# Patient Record
Sex: Female | Born: 1990
Health system: Southern US, Community
[De-identification: ages and names within clinical notes are randomized; demographics above are authoritative.]

## PROBLEM LIST (undated history)

## (undated) ENCOUNTER — Inpatient Hospital Stay (HOSPITAL_COMMUNITY): Payer: Self-pay

## (undated) DIAGNOSIS — A419 Sepsis, unspecified organism: Secondary | ICD-10-CM

## (undated) DIAGNOSIS — R571 Hypovolemic shock: Secondary | ICD-10-CM

## (undated) DIAGNOSIS — O1413 Severe pre-eclampsia, third trimester: Secondary | ICD-10-CM

## (undated) DIAGNOSIS — A6009 Herpesviral infection of other urogenital tract: Secondary | ICD-10-CM

## (undated) DIAGNOSIS — O139 Gestational [pregnancy-induced] hypertension without significant proteinuria, unspecified trimester: Secondary | ICD-10-CM

## (undated) DIAGNOSIS — F329 Major depressive disorder, single episode, unspecified: Secondary | ICD-10-CM

## (undated) DIAGNOSIS — K226 Gastro-esophageal laceration-hemorrhage syndrome: Secondary | ICD-10-CM

## (undated) DIAGNOSIS — F32A Depression, unspecified: Secondary | ICD-10-CM

## (undated) HISTORY — DX: Herpesviral infection of other urogenital tract: A60.09

## (undated) HISTORY — DX: Gestational (pregnancy-induced) hypertension without significant proteinuria, unspecified trimester: O13.9

## (undated) HISTORY — DX: Hypovolemic shock: R57.1

## (undated) HISTORY — PX: GASTRIC BYPASS: SHX52

## (undated) HISTORY — DX: Sepsis, unspecified organism: A41.9

## (undated) HISTORY — DX: Gastro-esophageal laceration-hemorrhage syndrome: K22.6

## (undated) HISTORY — DX: Severe pre-eclampsia, third trimester: O14.13

---
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
## 2011-03-22 LAB — HEPATIC FUNCTION PANEL
ALT: 32
AST: 25
Albumin: 3.2 — ABNORMAL LOW
Alkaline Phosphatase: 52
Total Protein: 6.2

## 2011-03-22 LAB — LIPID PANEL
Cholesterol: 91
LDL Cholesterol: 54

## 2011-03-22 LAB — DIFFERENTIAL
Eosinophils Absolute: 0
Lymphs Abs: 1.6
Monocytes Relative: 8
Neutro Abs: 2
Neutrophils Relative %: 50

## 2011-03-22 LAB — HEMOGLOBIN A1C
Hgb A1c MFr Bld: 5.3
Mean Plasma Glucose: 105

## 2011-03-22 LAB — DRUGS OF ABUSE SCREEN W/O ALC, ROUTINE URINE
Barbiturate Quant, Ur: NEGATIVE
Benzodiazepines.: NEGATIVE
Cocaine Metabolites: NEGATIVE
Methadone: NEGATIVE
Phencyclidine (PCP): NEGATIVE

## 2011-03-22 LAB — URINALYSIS, ROUTINE W REFLEX MICROSCOPIC
Glucose, UA: NEGATIVE
Hgb urine dipstick: NEGATIVE
pH: 6

## 2011-03-22 LAB — BASIC METABOLIC PANEL
BUN: 13
Chloride: 110
Potassium: 4.1

## 2011-03-22 LAB — CBC
HCT: 32.2 — ABNORMAL LOW
MCV: 84.3
Platelets: 223
WBC: 4 — ABNORMAL LOW

## 2011-03-22 LAB — GAMMA GT: GGT: 21

## 2013-06-20 HISTORY — PX: SLEEVE GASTROPLASTY: SHX1101

## 2015-06-21 NOTE — L&D Delivery Note (Signed)
Delivery Note At 9:08 PM a viable female was delivered via Vaginal, Spontaneous Delivery (Presentation: vertex;  OA).  APGAR: 7, 9; weight pending.   Placenta status: spontaneous, intact.  Cord: 3 vessel with the following complications: none.  Cord pH: not obtained  Anesthesia:  Epidural Episiotomy: None Lacerations: 1st degree Suture Repair: 3.0 vicryl Est. Blood Loss (mL):  600  Mom to postpartum.  Baby to Couplet care / Skin to Skin.  Jinny BlossomKaty D Mayo 04/01/2016, 9:44 PM  Patient is a G1 at 7071w5d who was admitted for IOL due to pre-e, significant hx of gastric restrictive surgery.  She progressed with augmentation via cytotec, foley, Pit and AROM.  I was gloved and present for delivery in its entirety.  Second stage of labor progressed to SVD.  Mild decels during second stage noted.  Complications: had boggy fundus approx 1hr after delivery requiring hemabate (had gotten cytotec @ del)  Lacerations: 1st degree  EBL: 600cc at del, then additional 200cc  Jermane Brayboy, CNM 2:02 AM  04/02/2016

## 2015-07-26 ENCOUNTER — Inpatient Hospital Stay (HOSPITAL_COMMUNITY)
Admission: AD | Admit: 2015-07-26 | Discharge: 2015-07-26 | Disposition: A | Discharge status: Home / Self Care | Payer: Self-pay | Source: Ambulatory Visit | Attending: Obstetrics & Gynecology | Admitting: Obstetrics & Gynecology

## 2015-07-26 ENCOUNTER — Encounter (HOSPITAL_COMMUNITY): Payer: Self-pay

## 2015-07-26 DIAGNOSIS — N760 Acute vaginitis: Secondary | ICD-10-CM | POA: Insufficient documentation

## 2015-07-26 DIAGNOSIS — A499 Bacterial infection, unspecified: Secondary | ICD-10-CM

## 2015-07-26 DIAGNOSIS — B9689 Other specified bacterial agents as the cause of diseases classified elsewhere: Secondary | ICD-10-CM

## 2015-07-26 DIAGNOSIS — Z3202 Encounter for pregnancy test, result negative: Secondary | ICD-10-CM | POA: Insufficient documentation

## 2015-07-26 HISTORY — DX: Depression, unspecified: F32.A

## 2015-07-26 HISTORY — DX: Major depressive disorder, single episode, unspecified: F32.9

## 2015-07-26 LAB — WET PREP, GENITAL
SPERM: NONE SEEN
TRICH WET PREP: NONE SEEN
Yeast Wet Prep HPF POC: NONE SEEN

## 2015-07-26 LAB — URINALYSIS, ROUTINE W REFLEX MICROSCOPIC
BILIRUBIN URINE: NEGATIVE
GLUCOSE, UA: NEGATIVE mg/dL
KETONES UR: NEGATIVE mg/dL
Nitrite: NEGATIVE
PROTEIN: NEGATIVE mg/dL
Specific Gravity, Urine: 1.025 (ref 1.005–1.030)
pH: 6 (ref 5.0–8.0)

## 2015-07-26 LAB — URINE MICROSCOPIC-ADD ON

## 2015-07-26 LAB — POCT PREGNANCY, URINE: PREG TEST UR: NEGATIVE

## 2015-07-26 MED ORDER — METRONIDAZOLE 500 MG PO TABS: 500.0000 mg | ORAL_TABLET | Freq: Two times a day (BID) | ORAL | Status: DC

## 2015-07-26 NOTE — Discharge Instructions (Signed)

## 2015-07-26 NOTE — MAU Provider Note (Signed)
History     CSN: [id number]  Arrival date and time: 07/26/15 1113   First Provider Initiated Contact with Patient 07/26/15 1144      Chief Complaint  Patient presents with  . Abdominal Pain  . Vaginal Bleeding   HPI  [name] at with complaint of abdominal pain and vaginal bleeding. States that she had one episode of unexpected bleeding w/ foreplay about 3 days ago, no heavy bleeding, only spotting at that time, bleeding is not ongoing. Also had one episode of sharp low abdominal pain, "like menstrual cramps" last night, no ongoing pain. Patient's last menstrual period was 07/07/2015 (exact date).    Past Medical History  Diagnosis Date  . Depression     pt hospitalized at 25 years old for major depression    Past Surgical History  Procedure Laterality Date  . Sleeve gastroplasty  2015    Family History  Problem Relation Age of Onset  . Cancer Maternal Grandmother     Social History  Substance Use Topics  . Smoking status: Never Smoker   . Smokeless tobacco: Never Used  . Alcohol Use: No    Allergies: No Known Allergies  No prescriptions prior to admission    Review of Systems  Constitutional: Negative.  Negative for fever and malaise/fatigue.  Respiratory: Negative.   Cardiovascular: Negative.   Gastrointestinal: Positive for abdominal pain. Negative for nausea, vomiting, diarrhea and constipation.  Genitourinary: Negative for dysuria, urgency, frequency, hematuria and flank pain.       Negative for vaginal discharge, dyspareunia, + spotting  Musculoskeletal: Negative.   Neurological: Negative.   Psychiatric/Behavioral: Negative.    Physical Exam   Blood pressure 115/90, pulse 93, temperature 98.8 F (37.1 C), temperature source Oral, resp. rate 18, height  (1.676 m), weight 240 lb (108.863 kg), last menstrual period 07/07/2015.  Physical Exam  Vitals reviewed. Constitutional: She is oriented to person, place, and time. She appears  well-developed. No distress.  Obese   HENT:  Head: Normocephalic and atraumatic.  Cardiovascular: Normal rate, regular rhythm and normal heart sounds.   Respiratory: Effort normal and breath sounds normal. No respiratory distress.  GI: Soft. Bowel sounds are normal. She exhibits no distension and no mass. There is no tenderness. There is no rebound and no guarding.  Genitourinary: There is no rash or lesion on the right labia. There is no rash or lesion on the left labia. Uterus is not deviated, not enlarged, not fixed and not tender. Cervix exhibits no motion tenderness, no discharge and no friability. Right adnexum displays no mass, no tenderness and no fullness. Left adnexum displays no mass, no tenderness and no fullness. No erythema, tenderness or bleeding in the vagina. Vaginal discharge (white) found.  Neurological: She is alert and oriented to person, place, and time.  Skin: Skin is warm and dry.  Psychiatric: She has a normal mood and affect.    MAU Course  Procedures  Results for orders placed or performed during the hospital encounter of 07/26/15 (from the past 24 hour(s))  Urinalysis, Routine w reflex microscopic (not at [facility])     Status: Abnormal   Collection Time: 07/26/15 11:40 AM  Result Value Ref Range   Color, Urine YELLOW YELLOW   APPearance CLEAR CLEAR   Specific Gravity, Urine 1.025 1.005 - 1.030   pH 6.0 5.0 - 8.0   Glucose, UA NEGATIVE NEGATIVE mg/dL   Hgb urine dipstick SMALL (A) NEGATIVE   Bilirubin Urine NEGATIVE NEGATIVE  Ketones, ur NEGATIVE NEGATIVE mg/dL   Protein, ur NEGATIVE NEGATIVE mg/dL   Nitrite NEGATIVE NEGATIVE   Leukocytes, UA MODERATE (A) NEGATIVE  Urine microscopic-add on     Status: Abnormal   Collection Time: 07/26/15 11:40 AM  Result Value Ref Range   Squamous Epithelial / LPF 0-5 (A) NONE SEEN   WBC, UA 6-30 0 - 5 WBC/hpf   RBC / HPF 0-5 0 - 5 RBC/hpf   Bacteria, UA RARE (A) NONE SEEN   Urine-Other MUCOUS PRESENT   Wet prep,  genital     Status: Abnormal   Collection Time: 07/26/15 12:05 PM  Result Value Ref Range   Yeast Wet Prep HPF POC NONE SEEN NONE SEEN   Trich, Wet Prep NONE SEEN NONE SEEN   Clue Cells Wet Prep HPF POC PRESENT (A) NONE SEEN   WBC, Wet Prep HPF POC FEW (A) NONE SEEN   Sperm NONE SEEN   Pregnancy, urine POC     Status: None   Collection Time: 07/26/15  1:19 PM  Result Value Ref Range   Preg Test, Ur NEGATIVE NEGATIVE     Assessment and Plan   1. BV (bacterial vaginosis)   Treat w/ Flagyl, follow up with worsening symptoms    Medication List    TAKE these medications        metroNIDAZOLE 500 MG tablet  Commonly known as:  FLAGYL  Take 1 tablet (500 mg total) by mouth 2 (two) times daily.            Follow-up Information    Follow up with [facility].   Why:  As needed   Contact information:   [address] [place] [address] [phone]        Carrie Cardenas 07/26/2015, 12:00 PM

## 2015-07-26 NOTE — MAU Note (Signed)
Pt c/o abdominal pain in lower middle abdomen and pelvic cramps starting last night. Pt also c/o vaginal bleeding since last week. Pt hasn't needed to wear a pad for the bleeding. Pt states her last started on 07/07/15. Pt is unsure if she is pregnant.

## 2015-07-27 LAB — GC/CHLAMYDIA PROBE AMP (~~LOC~~) NOT AT ARMC
CHLAMYDIA, DNA PROBE: POSITIVE — AB
NEISSERIA GONORRHEA: NEGATIVE

## 2015-07-27 LAB — RPR: RPR: NONREACTIVE

## 2015-07-27 LAB — HIV ANTIBODY (ROUTINE TESTING W REFLEX): HIV SCREEN 4TH GENERATION: NONREACTIVE

## 2015-08-03 ENCOUNTER — Emergency Department (HOSPITAL_COMMUNITY)
Admission: EM | Admit: 2015-08-03 | Discharge: 2015-08-03 | Disposition: A | Discharge status: Home / Self Care | Payer: Self-pay | Attending: Emergency Medicine | Admitting: Emergency Medicine

## 2015-08-03 ENCOUNTER — Encounter (HOSPITAL_COMMUNITY): Payer: Self-pay | Admitting: *Deleted

## 2015-08-03 DIAGNOSIS — Z8659 Personal history of other mental and behavioral disorders: Secondary | ICD-10-CM | POA: Insufficient documentation

## 2015-08-03 DIAGNOSIS — J039 Acute tonsillitis, unspecified: Secondary | ICD-10-CM | POA: Insufficient documentation

## 2015-08-03 DIAGNOSIS — H6122 Impacted cerumen, left ear: Secondary | ICD-10-CM | POA: Insufficient documentation

## 2015-08-03 LAB — RAPID STREP SCREEN (MED CTR MEBANE ONLY): Streptococcus, Group A Screen (Direct): NEGATIVE

## 2015-08-03 NOTE — Discharge Instructions (Signed)
Your strep test was negative. Her symptoms are likely due to a virus and should resolve on their own. You may continue using warm water salt rinses. You may also try using Cepacol that may help with your sore throat. Return to ED for any new or worsening symptoms as we discussed.  Tonsillitis Tonsillitis is an infection of the throat. This infection causes the tonsils to become red, tender, and puffy (swollen). Tonsils are groups of tissue at the back of your throat. If bacteria caused your infection, antibiotic medicine will be given to you. Sometimes symptoms of tonsillitis can be relieved with the use of steroid medicine. If your tonsillitis is severe and happens often, you may need to get your tonsils removed (tonsillectomy). HOME CARE   Rest and sleep often.  Drink enough fluids to keep your pee (urine) clear or pale yellow.  While your throat is sore, eat soft or liquid foods like:  Soup.  Ice cream.  Instant breakfast drinks.  Eat frozen ice pops.  Gargle with a warm or cold liquid to help soothe the throat. Gargle with a water and salt mix. Mix 1/4 teaspoon of salt and 1/4 teaspoon of baking soda in 1 cup of water.  Only take medicines as told by your doctor.  If you are given medicines (antibiotics), take them as told. Finish them even if you start to feel better. GET HELP IF:  You have large, tender lumps in your neck.  You have a rash.  You cough up green, yellow-brown, or bloody fluid.  You cannot swallow liquids or food for 24 hours.  You notice that only one of your tonsils is swollen. GET HELP RIGHT AWAY IF:   You throw up (vomit).  You have a very bad headache.  You have a stiff neck.  You have chest pain.  You have trouble breathing or swallowing.  You have bad throat pain, drooling, or your voice changes.  You have bad pain not helped by medicine.  You cannot fully open your mouth.  You have redness, puffiness, or bad pain in the neck.  You  have a fever. MAKE SURE YOU:   Understand these instructions.  Will watch your condition.  Will get help right away if you are not doing well or get worse.   This information is not intended to replace advice given to you by your health care provider. Make sure you discuss any questions you have with your health care provider.   Document Released: 11/23/2007 Document Revised: 06/11/2013 Document Reviewed: 11/23/2012 Elsevier Interactive Patient Education [employer]

## 2015-08-03 NOTE — ED Notes (Signed)
Pt reports a sore throat  1.5 days ago

## 2015-08-03 NOTE — ED Notes (Signed)
Declined W/C at D/C and was escorted to lobby by RN. 

## 2015-08-03 NOTE — ED Provider Notes (Signed)
CSN: [id number]     Arrival date & time 08/03/15  1237 History   By signing my name below, I, Iona Beard, attest that this documentation has been prepared under the direction and in the presence of Velna Hatchet, PA-C. Electronically Signed: Iona Beard, ED Scribe 08/03/2015 at 2:15 PM.   Chief Complaint  Patient presents with  . Sore Throat    The history is provided by the patient. No language interpreter was used.   HPI Comments: Carrie Cardenas is a 25 y.o. female who presents to the Emergency Department complaining of gradual onset, constant sore throat, onset about two days ago. Pt reports associated otalgia in her left ear. Also reports mild runny nose and nasal congestion. No worsening or alleviating factors noted. Son tried anything to improve symptoms. Pt denies cough, fever, SOB, or any other associated symptoms.    Past Medical History  Diagnosis Date  . Depression     pt hospitalized at 25 years old for major depression   Past Surgical History  Procedure Laterality Date  . Sleeve gastroplasty  2015  . Gastric bypass     Family History  Problem Relation Age of Onset  . Cancer Maternal Grandmother    Social History  Substance Use Topics  . Smoking status: Never Smoker   . Smokeless tobacco: Never Used  . Alcohol Use: No   OB History    Gravida Para Term Preterm AB TAB SAB Ectopic Multiple Living       Review of Systems  Constitutional: Negative for fever.  HENT: Positive for ear pain and sore throat.   Respiratory: Negative for cough and shortness of breath.       Allergies  Review of patient's allergies indicates no known allergies.  Home Medications   Prior to Admission medications   Medication Sig Start Date End Date Taking? Authorizing Provider  metroNIDAZOLE (FLAGYL) 500 MG tablet Take 1 tablet (500 mg total) by mouth 2 (two) times daily. 07/26/15   Archie Patten, CNM   BP 123/79 mmHg  Pulse 82  Temp(Src)  99 F (37.2 C) (Oral)  Resp 16  Ht  (1.676 m)  Wt 240 lb (108.863 kg)  BMI 38.76 kg/m2  SpO2 100%  LMP 07/07/2015 (Exact Date) Physical Exam  Constitutional: She appears well-developed and well-nourished. No distress.  HENT:  Head: Normocephalic and atraumatic.  Right Ear: Hearing, tympanic membrane and external ear normal.  Mouth/Throat: Oropharyngeal exudate and posterior oropharyngeal erythema present. No posterior oropharyngeal edema or tonsillar abscesses.  Mildl erythema to posterior oropharynx. Mild left tonsillar exudate.  No unilateral tonsillar  Swelling. No ceruminous.  No cervical lymphadenopathy.  Left ear cerumen impaction. Right ear TM normal.  Eyes: Conjunctivae and EOM are normal.  Neck: Neck supple. No tracheal deviation present.  Cardiovascular: Normal rate.   Pulmonary/Chest: Effort normal. No respiratory distress.  Musculoskeletal: Normal range of motion.  Neurological: She is alert.  Skin: Skin is warm and dry.  Psychiatric: She has a normal mood and affect. Her behavior is normal.    ED Course  Procedures (including critical care time) DIAGNOSTIC STUDIES: Oxygen Saturation is 100% on RA, normal by my interpretation.    COORDINATION OF CARE: 1:57 PM-Discussed treatment plan which includes rapid strep screen with pt at bedside and pt agreed to plan.   Labs Review Labs Reviewed  RAPID STREP SCREEN (NOT AT [facility])  CULTURE, GROUP A STREP [facility])  Imaging Review No results found. I have personally reviewed and evaluated these lab results as part of my medical decision-making.   EKG Interpretation None     Meds given in ED:  Medications - No data to display  Discharge Medication List as of 08/03/2015  3:12 PM     Filed Vitals:   08/03/15 1325 08/03/15 1528  BP: 123/79 124/77  Pulse: 82 78  Temp: 99 F (37.2 C)   TempSrc: Oral   Resp: 16 18  Height:  (1.676 m)   Weight: 108.863 kg   SpO2: 100% 99%    MDM  Patient  presents with sore throat, mild otalgia, nasal congestion,runny nose. Rapid strep is negative.  Physical exam is not concerning. Mild tonsillitis. Patients symptoms are consistent with URI, likely viral etiology. Discussed that antibiotics are not indicated for viral infections. Pt will be discharged with symptomatic treatment.  No evidence of retropharyngeal or peritonsillar abscess. Verbalizes understanding and is agreeable with plan. Pt is hemodynamically stable & in NAD prior to dc.  Final diagnoses:  Tonsillitis   I personally performed the services described in this documentation, which was scribed in my presence. The recorded information has been reviewed and is accurate.      Joycie Peek, PA-C 08/04/15 0901  Arby Barrette, MD 08/05/15 [phone]

## 2015-08-06 LAB — CULTURE, GROUP A STREP (THRC)

## 2015-08-24 ENCOUNTER — Inpatient Hospital Stay (HOSPITAL_COMMUNITY): Payer: Medicaid Other

## 2015-08-24 ENCOUNTER — Inpatient Hospital Stay (HOSPITAL_COMMUNITY)
Admission: AD | Admit: 2015-08-24 | Discharge: 2015-08-24 | Disposition: A | Discharge status: Home / Self Care | Payer: Medicaid Other | Source: Ambulatory Visit | Attending: Family Medicine | Admitting: Family Medicine

## 2015-08-24 ENCOUNTER — Encounter (HOSPITAL_COMMUNITY): Payer: Self-pay | Admitting: *Deleted

## 2015-08-24 DIAGNOSIS — O209 Hemorrhage in early pregnancy, unspecified: Secondary | ICD-10-CM | POA: Diagnosis not present

## 2015-08-24 DIAGNOSIS — O468X1 Other antepartum hemorrhage, first trimester: Secondary | ICD-10-CM

## 2015-08-24 DIAGNOSIS — R109 Unspecified abdominal pain: Secondary | ICD-10-CM | POA: Diagnosis not present

## 2015-08-24 DIAGNOSIS — Z3491 Encounter for supervision of normal pregnancy, unspecified, first trimester: Secondary | ICD-10-CM

## 2015-08-24 DIAGNOSIS — O26899 Other specified pregnancy related conditions, unspecified trimester: Secondary | ICD-10-CM

## 2015-08-24 DIAGNOSIS — O26891 Other specified pregnancy related conditions, first trimester: Secondary | ICD-10-CM | POA: Insufficient documentation

## 2015-08-24 DIAGNOSIS — O418X10 Other specified disorders of amniotic fluid and membranes, first trimester, not applicable or unspecified: Secondary | ICD-10-CM

## 2015-08-24 DIAGNOSIS — Z3A01 Less than 8 weeks gestation of pregnancy: Secondary | ICD-10-CM | POA: Diagnosis not present

## 2015-08-24 LAB — URINALYSIS, ROUTINE W REFLEX MICROSCOPIC
BILIRUBIN URINE: NEGATIVE
GLUCOSE, UA: NEGATIVE mg/dL
Hgb urine dipstick: NEGATIVE
KETONES UR: NEGATIVE mg/dL
Leukocytes, UA: NEGATIVE
NITRITE: NEGATIVE
PH: 6 (ref 5.0–8.0)
Protein, ur: NEGATIVE mg/dL

## 2015-08-24 LAB — CBC
HCT: 30.1 % — ABNORMAL LOW (ref 36.0–46.0)
HEMOGLOBIN: 9.9 g/dL — AB (ref 12.0–15.0)
MCH: 27.4 pg (ref 26.0–34.0)
MCHC: 32.9 g/dL (ref 30.0–36.0)
MCV: 83.4 fL (ref 78.0–100.0)
Platelets: 169 10*3/uL (ref 150–400)
RBC: 3.61 MIL/uL — AB (ref 3.87–5.11)
RDW: 13.7 % (ref 11.5–15.5)
WBC: 3.1 10*3/uL — AB (ref 4.0–10.5)

## 2015-08-24 LAB — WET PREP, GENITAL
Clue Cells Wet Prep HPF POC: NONE SEEN
Sperm: NONE SEEN
TRICH WET PREP: NONE SEEN
YEAST WET PREP: NONE SEEN

## 2015-08-24 LAB — ABO/RH: ABO/RH(D): A POS

## 2015-08-24 LAB — POCT PREGNANCY, URINE: PREG TEST UR: POSITIVE — AB

## 2015-08-24 LAB — HCG, QUANTITATIVE, PREGNANCY: HCG, BETA CHAIN, QUANT, S: 56031 m[IU]/mL — AB (ref <5)

## 2015-08-24 NOTE — MAU Note (Signed)
Pt presents to MAU with complaints of lower abdominal pain for a month. Denies any vaginal bleeding or abnormal discharge

## 2015-08-24 NOTE — Discharge Instructions (Signed)
First Trimester of Pregnancy °The first trimester of pregnancy is from week 1 until the end of week 12 (months 1 through 3). A week after a sperm fertilizes an egg, the egg will implant on the wall of the uterus. This embryo will begin to develop into a baby. Genes from you and your partner are forming the baby. The female genes determine whether the baby is a boy or a girl. At 6-8 weeks, the eyes and face are formed, and the heartbeat can be seen on ultrasound. At the end of 12 weeks, all the baby's organs are formed.  °Now that you are pregnant, you will want to do everything you can to have a healthy baby. Two of the most important things are to get good prenatal care and to follow your health care provider's instructions. Prenatal care is all the medical care you receive before the baby's birth. This care will help prevent, find, and treat any problems during the pregnancy and childbirth. °BODY CHANGES °Your body goes through many changes during pregnancy. The changes vary from woman to woman.  °· You may gain or lose a couple of pounds at first. °· You may feel sick to your stomach (nauseous) and throw up (vomit). If the vomiting is uncontrollable, call your health care provider. °· You may tire easily. °· You may develop headaches that can be relieved by medicines approved by your health care provider. °· You may urinate more often. Painful urination may mean you have a bladder infection. °· You may develop heartburn as a result of your pregnancy. °· You may develop constipation because certain hormones are causing the muscles that push waste through your intestines to slow down. °· You may develop hemorrhoids or swollen, bulging veins (varicose veins). °· Your breasts may begin to grow larger and become tender. Your nipples may stick out more, and the tissue that surrounds them (areola) may become darker. °· Your gums may bleed and may be sensitive to brushing and flossing. °· Dark spots or blotches (chloasma,  mask of pregnancy) may develop on your face. This will likely fade after the baby is born. °· Your menstrual periods will stop. °· You may have a loss of appetite. °· You may develop cravings for certain kinds of food. °· You may have changes in your emotions from day to day, such as being excited to be pregnant or being concerned that something may go wrong with the pregnancy and baby. °· You may have more vivid and strange dreams. °· You may have changes in your hair. These can include thickening of your hair, rapid growth, and changes in texture. Some women also have hair loss during or after pregnancy, or hair that feels dry or thin. Your hair will most likely return to normal after your baby is born. °WHAT TO EXPECT AT YOUR PRENATAL VISITS °During a routine prenatal visit: °· You will be weighed to make sure you and the baby are growing normally. °· Your blood pressure will be taken. °· Your abdomen will be measured to track your baby's growth. °· The fetal heartbeat will be listened to starting around week 10 or 12 of your pregnancy. °· Test results from any previous visits will be discussed. °Your health care provider may ask you: °· How you are feeling. °· If you are feeling the baby move. °· If you have had any abnormal symptoms, such as leaking fluid, bleeding, severe headaches, or abdominal cramping. °· If you are using any tobacco products,   including cigarettes, chewing tobacco, and electronic cigarettes. °· If you have any questions. °Other tests that may be performed during your first trimester include: °· Blood tests to find your blood type and to check for the presence of any previous infections. They will also be used to check for low iron levels (anemia) and Rh antibodies. Later in the pregnancy, blood tests for diabetes will be done along with other tests if problems develop. °· Urine tests to check for infections, diabetes, or protein in the urine. °· An ultrasound to confirm the proper growth  and development of the baby. °· An amniocentesis to check for possible genetic problems. °· Fetal screens for spina bifida and Down syndrome. °· You may need other tests to make sure you and the baby are doing well. °· HIV (human immunodeficiency virus) testing. Routine prenatal testing includes screening for HIV, unless you choose not to have this test. °HOME CARE INSTRUCTIONS  °Medicines °· Follow your health care provider's instructions regarding medicine use. Specific medicines may be either safe or unsafe to take during pregnancy. °· Take your prenatal vitamins as directed. °· If you develop constipation, try taking a stool softener if your health care provider approves. °Diet °· Eat regular, well-balanced meals. Choose a variety of foods, such as meat or vegetable-based protein, fish, milk and low-fat dairy products, vegetables, fruits, and whole grain breads and cereals. Your health care provider will help you determine the amount of weight gain that is right for you. °· Avoid raw meat and uncooked cheese. These carry germs that can cause birth defects in the baby. °· Eating four or five small meals rather than three large meals a day may help relieve nausea and vomiting. If you start to feel nauseous, eating a few soda crackers can be helpful. Drinking liquids between meals instead of during meals also seems to help nausea and vomiting. °· If you develop constipation, eat more high-fiber foods, such as fresh vegetables or fruit and whole grains. Drink enough fluids to keep your urine clear or pale yellow. °Activity and Exercise °· Exercise only as directed by your health care provider. Exercising will help you: °¨ Control your weight. °¨ Stay in shape. °¨ Be prepared for labor and delivery. °· Experiencing pain or cramping in the lower abdomen or low back is a good sign that you should stop exercising. Check with your health care provider before continuing normal exercises. °· Try to avoid standing for long  periods of time. Move your legs often if you must stand in one place for a long time. °· Avoid heavy lifting. °· Wear low-heeled shoes, and practice good posture. °· You may continue to have sex unless your health care provider directs you otherwise. °Relief of Pain or Discomfort °· Wear a good support bra for breast tenderness.   °· Take warm sitz baths to soothe any pain or discomfort caused by hemorrhoids. Use hemorrhoid cream if your health care provider approves.   °· Rest with your legs elevated if you have leg cramps or low back pain. °· If you develop varicose veins in your legs, wear support hose. Elevate your feet for 15 minutes, 3-4 times a day. Limit salt in your diet. °Prenatal Care °· Schedule your prenatal visits by the twelfth week of pregnancy. They are usually scheduled monthly at first, then more often in the last 2 months before delivery. °· Write down your questions. Take them to your prenatal visits. °· Keep all your prenatal visits as directed by your   health care provider. °Safety °· Wear your seat belt at all times when driving. °· Make a list of emergency phone numbers, including numbers for family, friends, the hospital, and police and fire departments. °General Tips °· Ask your health care provider for a referral to a local prenatal education class. Begin classes no later than at the beginning of month 6 of your pregnancy. °· Ask for help if you have counseling or nutritional needs during pregnancy. Your health care provider can offer advice or refer you to specialists for help with various needs. °· Do not use hot tubs, steam rooms, or saunas. °· Do not douche or use tampons or scented sanitary pads. °· Do not cross your legs for long periods of time. °· Avoid cat litter boxes and soil used by cats. These carry germs that can cause birth defects in the baby and possibly loss of the fetus by miscarriage or stillbirth. °· Avoid all smoking, herbs, alcohol, and medicines not prescribed by  your health care provider. Chemicals in these affect the formation and growth of the baby. °· Do not use any tobacco products, including cigarettes, chewing tobacco, and electronic cigarettes. If you need help quitting, ask your health care provider. You may receive counseling support and other resources to help you quit. °· Schedule a dentist appointment. At home, brush your teeth with a soft toothbrush and be gentle when you floss. °SEEK MEDICAL CARE IF:  °· You have dizziness. °· You have mild pelvic cramps, pelvic pressure, or nagging pain in the abdominal area. °· You have persistent nausea, vomiting, or diarrhea. °· You have a bad smelling vaginal discharge. °· You have pain with urination. °· You notice increased swelling in your face, hands, legs, or ankles. °SEEK IMMEDIATE MEDICAL CARE IF:  °· You have a fever. °· You are leaking fluid from your vagina. °· You have spotting or bleeding from your vagina. °· You have severe abdominal cramping or pain. °· You have rapid weight gain or loss. °· You vomit blood or material that looks like coffee grounds. °· You are exposed to German measles and have never had them. °· You are exposed to fifth disease or chickenpox. °· You develop a severe headache. °· You have shortness of breath. °· You have any kind of trauma, such as from a fall or a car accident. °  °This information is not intended to replace advice given to you by your health care provider. Make sure you discuss any questions you have with your health care provider. °  °Document Released: 05/31/2001 Document Revised: 06/27/2014 Document Reviewed: 04/16/2013 °Elsevier Interactive Patient Education ©2016 [employer] °Subchorionic Hematoma °A subchorionic hematoma is a gathering of blood between the outer wall of the placenta and the inner wall of the womb (uterus). The placenta is the organ that connects the fetus to the wall of the uterus. The placenta performs the feeding, breathing (oxygen to the  fetus), and waste removal (excretory work) of the fetus.  °Subchorionic hematoma is the most common abnormality found on a result from ultrasonography done during the first trimester or early second trimester of pregnancy. If there has been little or no vaginal bleeding, early small hematomas usually shrink on their own and do not affect your baby or pregnancy. The blood is gradually absorbed over 1-2 weeks. When bleeding starts later in pregnancy or the hematoma is larger or occurs in an older pregnant woman, the outcome may not be as good. Larger hematomas may get bigger, which   increases the chances for miscarriage. Subchorionic hematoma also increases the risk of premature detachment of the placenta from the uterus, preterm (premature) labor, and stillbirth. °HOME CARE INSTRUCTIONS °· Stay on bed rest if your health care provider recommends this. Although bed rest will not prevent more bleeding or prevent a miscarriage, your health care provider may recommend bed rest until you are advised otherwise. °· Avoid heavy lifting (more than 10 lb [4.5 kg]), exercise, sexual intercourse, or douching as directed by your health care provider. °· Keep track of the number of pads you use each day and how soaked (saturated) they are. Write down this information. °· Do not use tampons. °· Keep all follow-up appointments as directed by your health care provider. Your health care provider may ask you to have follow-up blood tests or ultrasound tests or both. °SEEK IMMEDIATE MEDICAL CARE IF: °· You have severe cramps in your stomach, back, abdomen, or pelvis. °· You have a fever. °· You pass large clots or tissue. Save any tissue for your health care provider to look at. °· Your bleeding increases or you become lightheaded, feel weak, or have fainting episodes. °  °This information is not intended to replace advice given to you by your health care provider. Make sure you discuss any questions you have with your health care  provider. °  °Document Released: 09/21/2006 Document Revised: 06/27/2014 Document Reviewed: 01/03/2013 °Elsevier Interactive Patient Education ©2016 [employer] ° °

## 2015-08-24 NOTE — [facility] Provider Note (Signed)
History     CSN: [id number]  Arrival date and time: 08/24/15 [id number]   First Provider Initiated Contact with Patient 08/24/15 1012         Chief Complaint  Patient presents with  . Abdominal Pain  . Possible Pregnancy   HPI  Carrie Cardenas is a 25 y.o. G1P0000 at 8534w6d who presents for abdominal pain & possible pregnancy. Reports period like cramps in lower abdomen x 2 weeks. Pain comes & goes, occurs several times per day. Rates pain 5/10. No treatment.  Denies vaginal bleeding, vaginal discharge, n/v/d, constipation, or dysuria.   OB History    Gravida Para Term Preterm AB TAB SAB Ectopic Multiple Living   1 0 0 0 0 0 0 0 0 0       Past Medical History  Diagnosis Date  . Depression     pt hospitalized at 25 years old for major depression    Past Surgical History  Procedure Laterality Date  . Sleeve gastroplasty  2015  . Gastric bypass      Family History  Problem Relation Age of Onset  . Cancer Maternal Grandmother     Social History  Substance Use Topics  . Smoking status: Never Smoker   . Smokeless tobacco: Never Used  . Alcohol Use: No    Allergies: No Known Allergies  Prescriptions prior to admission  Medication Sig Dispense Refill Last Dose  . metroNIDAZOLE (FLAGYL) 500 MG tablet Take 1 tablet (500 mg total) by mouth 2 (two) times daily. (Patient not taking: Reported on 08/24/2015) 14 tablet 0     Review of Systems  Constitutional: Negative.   Gastrointestinal: Positive for abdominal pain. Negative for nausea, vomiting, diarrhea and constipation.  Genitourinary: Negative.    Physical Exam   Blood pressure 111/61, pulse 75, temperature 98.4 F (36.9 C), temperature source Oral, resp. rate 18, last menstrual period 07/07/2015.  Physical Exam  Nursing note and vitals reviewed. Constitutional: She is oriented to person, place, and time. She appears well-developed and well-nourished. No distress.  HENT:  Head: Normocephalic and atraumatic.  Eyes:  Conjunctivae are normal. Right eye exhibits no discharge. Left eye exhibits no discharge. No scleral icterus.  Neck: Normal range of motion.  Cardiovascular: Normal rate, regular rhythm and normal heart sounds.   No murmur heard. Respiratory: Effort normal and breath sounds normal. No respiratory distress. She has no wheezes.  GI: Soft. Bowel sounds are normal. She exhibits no distension. There is no tenderness.  Genitourinary: No bleeding in the vagina. Vaginal discharge (small amount of thin white discharge) found.  Cervix closed  Neurological: She is alert and oriented to person, place, and time.  Skin: Skin is warm and dry. She is not diaphoretic.  Psychiatric: She has a normal mood and affect. Her behavior is normal. Judgment and thought content normal.    [facility] Course  Procedures Results for orders placed or performed during the hospital encounter of 08/24/15 (from the past 24 hour(s))  Urinalysis, Routine w reflex microscopic ([facility])     Status: Abnormal   Collection Time: 08/24/15  9:11 AM  Result Value Ref Range   Color, Urine YELLOW YELLOW   APPearance CLEAR CLEAR   Specific Gravity, Urine >1.030 (H) 1.005 - 1.030   pH 6.0 5.0 - 8.0   Glucose, UA NEGATIVE NEGATIVE mg/dL   Hgb urine dipstick NEGATIVE NEGATIVE   Bilirubin Urine NEGATIVE NEGATIVE   Ketones, ur NEGATIVE NEGATIVE mg/dL   Protein, ur NEGATIVE NEGATIVE mg/dL  Nitrite NEGATIVE NEGATIVE   Leukocytes, UA NEGATIVE NEGATIVE  Pregnancy, urine POC     Status: Abnormal   Collection Time: 08/24/15  9:13 AM  Result Value Ref Range   Preg Test, Ur POSITIVE (A) NEGATIVE  CBC     Status: Abnormal   Collection Time: 08/24/15  9:41 AM  Result Value Ref Range   WBC 3.1 (L) 4.0 - 10.5 K/uL   RBC 3.61 (L) 3.87 - 5.11 MIL/uL   Hemoglobin 9.9 (L) 12.0 - 15.0 g/dL   HCT 16.1 (L) 09.6 - 04.5 %   MCV 83.4 78.0 - 100.0 fL   MCH 27.4 26.0 - 34.0 pg   MCHC 32.9 30.0 - 36.0 g/dL   RDW 40.9 81.1 - 91.4 %   Platelets 169 150  - 400 K/uL  ABO/Rh     Status: None (Preliminary result)   Collection Time: 08/24/15  9:41 AM  Result Value Ref Range   ABO/RH(D) A POS   hCG, quantitative, pregnancy     Status: Abnormal   Collection Time: 08/24/15  9:41 AM  Result Value Ref Range   hCG, Beta Chain, Quant, S 78295 (H) <5 mIU/mL  Wet prep, genital     Status: Abnormal   Collection Time: 08/24/15 10:27 AM  Result Value Ref Range   Yeast Wet Prep HPF POC NONE SEEN NONE SEEN   Trich, Wet Prep NONE SEEN NONE SEEN   Clue Cells Wet Prep HPF POC NONE SEEN NONE SEEN   WBC, Wet Prep HPF POC MODERATE (A) NONE SEEN   Sperm NONE SEEN    US Ob Comp Less 14 Wks  08/24/2015  CLINICAL DATA:  Pregnant, abdominal pain EXAM: OBSTETRIC <14 WK [place] AND TRANSVAGINAL OB US TECHNIQUE: Both transabdominal and transvaginal ultrasound examinations were performed for complete evaluation of the gestation as well as the maternal uterus, adnexal regions, and pelvic cul-de-sac. Transvaginal technique was performed to assess early pregnancy. COMPARISON:  None. FINDINGS: Intrauterine gestational sac: Visualized/normal in shape. Yolk sac:  Present Embryo:  Present Cardiac Activity: Present Heart Rate: 135  bpm CRL:  10.0  mm   7 w   1 d                  [place] EDC: 04/10/2016 Subchorionic hemorrhage:  Small subchronic hemorrhage. Maternal uterus/adnexae: Bilateral ovaries are within normal limits, noting a 2.0 cm left corpus luteal cyst. Small volume pelvic and right adnexal fluid. IMPRESSION: Single live intrauterine gestation, with estimated gestational age [redacted] weeks 1 day by crown-rump length. Electronically Signed   By: Charline Bills M.D.   On: 08/24/2015 11:44   US Ob Transvaginal  08/24/2015  CLINICAL DATA:  Pregnant, abdominal pain EXAM: OBSTETRIC <14 WK [place] AND TRANSVAGINAL OB US TECHNIQUE: Both transabdominal and transvaginal ultrasound examinations were performed for complete evaluation of the gestation as well as the maternal uterus, adnexal regions, and  pelvic cul-de-sac. Transvaginal technique was performed to assess early pregnancy. COMPARISON:  None. FINDINGS: Intrauterine gestational sac: Visualized/normal in shape. Yolk sac:  Present Embryo:  Present Cardiac Activity: Present Heart Rate: 135  bpm CRL:  10.0  mm   7 w   1 d                  [place] EDC: 04/10/2016 Subchorionic hemorrhage:  Small subchronic hemorrhage. Maternal uterus/adnexae: Bilateral ovaries are within normal limits, noting a 2.0 cm left corpus luteal cyst. Small volume pelvic and right adnexal fluid. IMPRESSION: Single live intrauterine gestation, with  estimated gestational age [redacted] weeks 1 day by crown-rump length. Electronically Signed   By: Charline Bills M.D.   On: 08/24/2015 11:44     MDM A positive UPT positive Ultrasound shows SIUP with cardiac activity Assessment and Plan  A: 1. Normal IUP (intrauterine pregnancy) on prenatal ultrasound, first trimester   2. Abdominal pain in pregnancy   3. Subchorionic hematoma in first trimester     P; Discharge home Pregnancy verification letter & ob/gyn provider list given Discussed reasons to return to [facility] Start prenatal care Take prenatal vitamins  Judeth Horn 08/24/2015, 10:11 AM

## 2015-08-25 LAB — GC/CHLAMYDIA PROBE AMP (~~LOC~~) NOT AT ARMC
CHLAMYDIA, DNA PROBE: NEGATIVE
Neisseria Gonorrhea: NEGATIVE

## 2015-08-25 LAB — HIV ANTIBODY (ROUTINE TESTING W REFLEX): HIV SCREEN 4TH GENERATION: NONREACTIVE

## 2015-09-30 ENCOUNTER — Encounter (HOSPITAL_COMMUNITY): Payer: Self-pay | Admitting: Certified Nurse Midwife

## 2015-09-30 ENCOUNTER — Encounter: Payer: Self-pay | Admitting: Certified Nurse Midwife

## 2015-09-30 ENCOUNTER — Ambulatory Visit (INDEPENDENT_AMBULATORY_CARE_PROVIDER_SITE_OTHER): Payer: Medicaid Other | Admitting: Certified Nurse Midwife

## 2015-09-30 DIAGNOSIS — Z36 Encounter for antenatal screening of mother: Secondary | ICD-10-CM

## 2015-09-30 DIAGNOSIS — Z3493 Encounter for supervision of normal pregnancy, unspecified, third trimester: Secondary | ICD-10-CM | POA: Insufficient documentation

## 2015-09-30 DIAGNOSIS — O3680X0 Pregnancy with inconclusive fetal viability, not applicable or unspecified: Secondary | ICD-10-CM

## 2015-09-30 DIAGNOSIS — Z113 Encounter for screening for infections with a predominantly sexual mode of transmission: Secondary | ICD-10-CM

## 2015-09-30 DIAGNOSIS — O3680X1 Pregnancy with inconclusive fetal viability, fetus 1: Secondary | ICD-10-CM

## 2015-09-30 DIAGNOSIS — Z3401 Encounter for supervision of normal first pregnancy, first trimester: Secondary | ICD-10-CM

## 2015-09-30 DIAGNOSIS — Z124 Encounter for screening for malignant neoplasm of cervix: Secondary | ICD-10-CM

## 2015-09-30 NOTE — Patient Instructions (Signed)

## 2015-09-30 NOTE — Progress Notes (Signed)
   Subjective:    Carrie Cardenas is a G1P0000 6613w1d being seen today for her first obstetrical visit.  Her obstetrical history is significant for obesity.She had gastric sleeve surgery in 2015 and has lost 90 pounds Patient does intend to breast feed. Pregnancy history fully reviewed.  Patient reports nausea.  Filed Vitals:   09/30/15 0934  BP: 119/72  Pulse: 73  Weight: 230 lb 11.2 oz (104.645 kg)    HISTORY: OB History  Gravida Para Term Preterm AB SAB TAB Ectopic Multiple Living  1 0 0 0 0 0 0 0 0 0     # Outcome Date GA Lbr Len/2nd Weight Sex Delivery Anes PTL Lv  1 Current              Past Medical History  Diagnosis Date  . Depression     pt hospitalized at 25 years old for major depression   Past Surgical History  Procedure Laterality Date  . Sleeve gastroplasty  2015  . Gastric bypass     Family History  Problem Relation Age of Onset  . Cancer Maternal Grandmother   . Fibroids Mother   . Anemia Mother   . Pulmonary embolism Mother      Exam    Uterus:     Pelvic Exam:    Perineum: No Hemorrhoids   Vulva: normal   Vagina:  curdlike discharge   pH:    Cervix: no bleeding following Pap   Adnexa: not evaluated   Bony Pelvis: average  System: Breast:     Skin: normal coloration and turgor, no rashes    Neurologic: oriented, normal, normal mood   Extremities: normal strength, tone, and muscle mass   HEENT    Mouth/Teeth mucous membranes moist, pharynx normal without lesions   Neck supple and no masses   Cardiovascular: regular rate and rhythm   Respiratory:  appears well, vitals normal, no respiratory distress, acyanotic, normal RR, ear and throat exam is normal, neck free of mass or lymphadenopathy, chest clear, no wheezing, crepitations, rhonchi, normal symmetric air entry   Abdomen: soft, non-tender; bowel sounds normal; no masses,  no organomegaly   Urinary: urethral meatus normal      Assessment:    Pregnancy: G1P0000 Patient Active  Problem List   Diagnosis Date Noted  . Supervision of normal first pregnancy in first trimester 09/30/2015  . History of gastric restrictive surgery 09/30/2015        Plan:     Initial labs drawn. Prenatal vitamins. Problem list reviewed and updated. Genetic Screening discussed First Screen: ordered.  Ultrasound discussed; fetal survey: requested.  Follow up in 4 weeks. 50% of 30 min visit spent on counseling and coordination of care.    Takisha Pelle Grissett 09/30/2015

## 2015-09-30 NOTE — Progress Notes (Signed)
Bedside US for viability = Single IUP, FHR = 162 per PW doppler.  

## 2015-10-01 LAB — PRENATAL PROFILE (SOLSTAS)
Antibody Screen: NEGATIVE
Basophils Absolute: 0 cells/uL (ref 0–200)
Basophils Relative: 0 %
Eosinophils Absolute: 30 cells/uL (ref 15–500)
Eosinophils Relative: 1 %
HCT: 32.4 % — ABNORMAL LOW (ref 35.0–45.0)
HIV 1&2 Ab, 4th Generation: NONREACTIVE
Hemoglobin: 10.6 g/dL — ABNORMAL LOW (ref 11.7–15.5)
Hepatitis B Surface Ag: NEGATIVE
Lymphocytes Relative: 41 %
Lymphs Abs: 1230 cells/uL (ref 850–3900)
MCH: 27.2 pg (ref 27.0–33.0)
MCHC: 32.7 g/dL (ref 32.0–36.0)
MCV: 83.3 fL (ref 80.0–100.0)
MPV: 11.4 fL (ref 7.5–12.5)
Monocytes Absolute: 330 cells/uL (ref 200–950)
Monocytes Relative: 11 %
Neutro Abs: 1410 cells/uL — ABNORMAL LOW (ref 1500–7800)
Neutrophils Relative %: 47 %
Platelets: 192 10*3/uL (ref 140–400)
RBC: 3.89 MIL/uL (ref 3.80–5.10)
RDW: 14.8 % (ref 11.0–15.0)
Rh Type: POSITIVE
Rubella: 5.32 Index — ABNORMAL HIGH (ref <0.90)
WBC: 3 10*3/uL — ABNORMAL LOW (ref 3.8–10.8)

## 2015-10-01 LAB — CYTOLOGY - PAP

## 2015-10-02 LAB — HEMOGLOBINOPATHY EVALUATION
Hemoglobin Other: 0 %
Hgb A2 Quant: 3.3 % — ABNORMAL HIGH (ref 2.2–3.2)
Hgb A: 96.7 % — ABNORMAL LOW (ref 96.8–97.8)
Hgb F Quant: 0 % (ref 0.0–2.0)
Hgb S Quant: 0 %

## 2015-10-06 ENCOUNTER — Ambulatory Visit (HOSPITAL_COMMUNITY): Admission: RE | Admit: 2015-10-06 | Payer: Self-pay | Source: Ambulatory Visit

## 2015-10-06 ENCOUNTER — Ambulatory Visit (HOSPITAL_COMMUNITY): Payer: Self-pay | Attending: Certified Nurse Midwife

## 2015-10-07 ENCOUNTER — Encounter: Payer: Self-pay | Admitting: Certified Nurse Midwife

## 2015-10-16 ENCOUNTER — Telehealth: Payer: Self-pay | Admitting: *Deleted

## 2015-10-16 NOTE — Telephone Encounter (Signed)
Called pt to inform her of need for Colpo due to abnormal Pap (LSIL). There was no answer.  Appt will need to be scheduled once pt has been notified of results.

## 2015-10-19 NOTE — Telephone Encounter (Signed)
I attempted to call patient but there was no answer.

## 2015-10-22 NOTE — Telephone Encounter (Signed)
2nd attempt to reach patient with no answer or voicemail to leave a message

## 2015-10-28 ENCOUNTER — Ambulatory Visit (INDEPENDENT_AMBULATORY_CARE_PROVIDER_SITE_OTHER): Payer: Medicaid Other | Admitting: Certified Nurse Midwife

## 2015-10-28 ENCOUNTER — Encounter: Payer: Self-pay | Admitting: Certified Nurse Midwife

## 2015-10-28 VITALS — BP 108/46 | HR 73 | Wt 242.6 lb

## 2015-10-28 DIAGNOSIS — Z9884 Bariatric surgery status: Secondary | ICD-10-CM

## 2015-10-28 DIAGNOSIS — O99842 Bariatric surgery status complicating pregnancy, second trimester: Secondary | ICD-10-CM

## 2015-10-28 DIAGNOSIS — Z1389 Encounter for screening for other disorder: Secondary | ICD-10-CM

## 2015-10-28 DIAGNOSIS — Z3401 Encounter for supervision of normal first pregnancy, first trimester: Secondary | ICD-10-CM

## 2015-10-28 LAB — POCT URINALYSIS DIP (DEVICE)
Bilirubin Urine: NEGATIVE
Glucose, UA: NEGATIVE mg/dL
Hgb urine dipstick: NEGATIVE
Leukocytes, UA: NEGATIVE
Nitrite: NEGATIVE
Protein, ur: NEGATIVE mg/dL
Specific Gravity, Urine: 1.025 (ref 1.005–1.030)
Urobilinogen, UA: 0.2 mg/dL (ref 0.0–1.0)
pH: 6.5 (ref 5.0–8.0)

## 2015-10-28 NOTE — Patient Instructions (Signed)

## 2015-10-28 NOTE — Progress Notes (Signed)
Pt states she missed her early screening ultrasound. She reports [facility] the [facility] 1 week ago because she had a relative that was making her upset. She states she is fine now and does not have any feelings that she would harm herself or others. Subjective:  Carrie Cardenas is a 25 y.o. G1P0000 at 859w1d being seen today for ongoing prenatal care.  She is currently monitored for the following issues for this high-risk pregnancy and has Supervision of normal first pregnancy in first trimester and History of gastric restrictive surgery on her problem list.  Patient reports no complaints.  Contractions: Not present. Vag. Bleeding: None.  Movement: Present. Denies leaking of fluid.   The following portions of the patient's history were reviewed and updated as appropriate: allergies, current medications, past family history, past medical history, past social history, past surgical history and problem list. Problem list updated.  Objective:   Filed Vitals:   10/28/15 1132  BP: 108/46  Pulse: 73  Weight: 242 lb 9.6 oz (110.043 kg)    Fetal Status: Fetal Heart Rate (bpm): 145   Movement: Present     General:  Alert, oriented and cooperative. Patient is in no acute distress.  Skin: Skin is warm and dry. No rash noted.   Cardiovascular: Normal heart rate noted  Respiratory: Normal respiratory effort, no problems with respiration noted  Abdomen: Soft, gravid, appropriate for gestational age. Pain/Pressure: Absent     Pelvic: Vag. Bleeding: None     Cervical exam deferred        Extremities: Normal range of motion.  Edema: None  Mental Status: Normal mood and affect. Normal behavior. Normal judgment and thought content.   Urinalysis: Urine Protein: Negative Urine Glucose: Negative  Assessment and Plan:  Pregnancy: G1P0000 at 519w1d  1. Supervision of normal first pregnancy in first trimester  - US MFM OB COMP + 14 WK; Future  2. Encounter for routine screening for malformation  using ultrasonics  - US MFM OB COMP + 14 WK; Future  3. History of gastric restrictive surgery   Preterm labor symptoms and general obstetric precautions including but not limited to vaginal bleeding, contractions, leaking of fluid and fetal movement were reviewed in detail with the patient. Please refer to After Visit Summary for other counseling recommendations.  Return in about 4 weeks (around 11/25/2015).   Rhea PinkLori A Demarlo Riojas, CNM

## 2015-11-06 ENCOUNTER — Encounter: Payer: Self-pay | Admitting: *Deleted

## 2015-11-06 NOTE — Telephone Encounter (Signed)
Letter sent to pt stating that her recent Pap was abnormal and follow up is needed. Pt needs to call office to schedule appt and discuss results with a nurse.

## 2015-11-19 ENCOUNTER — Ambulatory Visit (HOSPITAL_COMMUNITY)
Admission: RE | Admit: 2015-11-19 | Discharge: 2015-11-19 | Disposition: A | Discharge status: Home / Self Care | Payer: Medicaid Other | Source: Ambulatory Visit | Attending: Certified Nurse Midwife | Admitting: Certified Nurse Midwife

## 2015-11-19 ENCOUNTER — Other Ambulatory Visit: Payer: Self-pay | Admitting: Certified Nurse Midwife

## 2015-11-19 DIAGNOSIS — O99842 Bariatric surgery status complicating pregnancy, second trimester: Secondary | ICD-10-CM

## 2015-11-19 DIAGNOSIS — Z3A19 19 weeks gestation of pregnancy: Secondary | ICD-10-CM

## 2015-11-19 DIAGNOSIS — Z3401 Encounter for supervision of normal first pregnancy, first trimester: Secondary | ICD-10-CM

## 2015-11-19 DIAGNOSIS — Z6839 Body mass index (BMI) 39.0-39.9, adult: Secondary | ICD-10-CM | POA: Diagnosis not present

## 2015-11-19 DIAGNOSIS — O99212 Obesity complicating pregnancy, second trimester: Secondary | ICD-10-CM

## 2015-11-19 DIAGNOSIS — Z1389 Encounter for screening for other disorder: Secondary | ICD-10-CM

## 2015-11-25 ENCOUNTER — Encounter: Payer: Self-pay | Admitting: Obstetrics and Gynecology

## 2015-11-25 ENCOUNTER — Ambulatory Visit (INDEPENDENT_AMBULATORY_CARE_PROVIDER_SITE_OTHER): Payer: Medicaid Other | Admitting: Obstetrics and Gynecology

## 2015-11-25 ENCOUNTER — Telehealth: Payer: Self-pay

## 2015-11-25 VITALS — BP 116/81 | HR 88 | Wt 236.2 lb

## 2015-11-25 DIAGNOSIS — N760 Acute vaginitis: Secondary | ICD-10-CM

## 2015-11-25 DIAGNOSIS — B3731 Acute candidiasis of vulva and vagina: Secondary | ICD-10-CM

## 2015-11-25 DIAGNOSIS — O26892 Other specified pregnancy related conditions, second trimester: Secondary | ICD-10-CM | POA: Diagnosis not present

## 2015-11-25 DIAGNOSIS — N898 Other specified noninflammatory disorders of vagina: Secondary | ICD-10-CM | POA: Diagnosis not present

## 2015-11-25 DIAGNOSIS — B373 Candidiasis of vulva and vagina: Secondary | ICD-10-CM

## 2015-11-25 DIAGNOSIS — A499 Bacterial infection, unspecified: Secondary | ICD-10-CM

## 2015-11-25 DIAGNOSIS — Z3402 Encounter for supervision of normal first pregnancy, second trimester: Secondary | ICD-10-CM | POA: Diagnosis present

## 2015-11-25 DIAGNOSIS — B9689 Other specified bacterial agents as the cause of diseases classified elsewhere: Secondary | ICD-10-CM

## 2015-11-25 LAB — POCT URINALYSIS DIP (DEVICE)
GLUCOSE, UA: NEGATIVE mg/dL
HGB URINE DIPSTICK: NEGATIVE
KETONES UR: 40 mg/dL — AB
Nitrite: NEGATIVE
Protein, ur: 30 mg/dL — AB
SPECIFIC GRAVITY, URINE: 1.025 (ref 1.005–1.030)
UROBILINOGEN UA: 1 mg/dL (ref 0.0–1.0)
pH: 6 (ref 5.0–8.0)

## 2015-11-25 LAB — GLUCOSE, CAPILLARY: Glucose-Capillary: 90 mg/dL (ref 65–99)

## 2015-11-25 MED ORDER — FLUCONAZOLE 150 MG PO TABS: ORAL_TABLET | ORAL | Status: DC

## 2015-11-25 NOTE — Telephone Encounter (Signed)
Pt was found in the restroom complaining of dizziness,fatigue and seating on the toilet with the assisted of my co-worker patient was taken to a room to be care. Pt blood pressure was 101/67  CBG 90. Pt is currently [redacted] weeks pregnant she stated she had not eaten since 6 am. Pt was given Prentis Langdon crackers and a coke to help with her feeling shaky. I waited about 20 minutes and patient stated she felt better. I advised patient if she should experience any symptoms like this she should call us back or go to the MAU to be check out.

## 2015-11-25 NOTE — Addendum Note (Signed)
Addended by: Jill SideAY, Phares Zaccone L on: 11/25/2015 11:22 AM   Modules accepted: Orders

## 2015-11-25 NOTE — Progress Notes (Signed)
Pt states she had a lump outside of vagina 2 weeks ago which was not painful and has now gotten smaller. She is also having vaginal dischsrge w/itching.

## 2015-11-25 NOTE — Progress Notes (Signed)
Subjective:  Carrie Cardenas is a 25 y.o. G1P0000 at 3478w1d being seen today for ongoing prenatal care.  She is currently monitored for the following issues for this low-risk pregnancy and has Supervision of normal first pregnancy in first trimester and History of gastric restrictive surgery on her problem list.  Patient reports pruritic vaginal discharge. lump on labia x 1 month getting smaller, not sure if drained, nontender.   Contractions: Not present. Vag. Bleeding: None.  Movement: Present. Denies leaking of fluid.   The following portions of the patient's history were reviewed and updated as appropriate: allergies, current medications, past family history, past medical history, past social history, past surgical history and problem list. Problem list updated.  Objective:   Filed Vitals:   11/25/15 1041  BP: 116/81  Pulse: 88  Weight: 236 lb 3.2 oz (107.14 kg)    Fetal Status: Fetal Heart Rate (bpm): 156   Movement: Present     General:  Alert, oriented and cooperative. Patient is in no acute distress.  Skin: Skin is warm and dry. No rash noted.   Cardiovascular: Normal heart rate noted  Respiratory: Normal respiratory effort, no problems with respiration noted  Abdomen: Soft, gravid, appropriate for gestational age. Pain/Pressure: Absent     Pelvic: Vag. Bleeding: None Vag D/C Character: White   Cervical exam performed        Extremities: Normal range of motion.  Edema: None  Mental Status: Normal mood and affect. Normal behavior. Normal judgment and thought content.  Spec: thick curdy discharge and slightly red vaginal mucosa Lower left labium majorum 1 cm nontender circumscribed mass, not draining, not eroded Urinalysis: Urine Protein: 1+ Urine Glucose: Negative  Assessment and Plan:  Pregnancy: G1P0000 at 7778w1d  1. Encounter for supervision of normal first pregnancy in second trimester Had fetal arrythmia on routine US with MFM POC for DT FHR q week until F/U scan  12/31/15  2. Vaginal discharge during pregnancy in second trimester Apparent yeast> presumptive tx with Diflucan Apparent healing sebaceous cyst left labium  - Wet prep, genital  Preterm labor symptoms and general obstetric precautions including but not limited to vaginal bleeding, contractions, leaking of fluid and fetal movement were reviewed in detail with the patient. Please refer to After Visit Summary for other counseling recommendations.  Return in about 4 weeks (around 12/23/2015).   Danae Orleanseirdre C Meria Crilly, CNM

## 2015-11-25 NOTE — Patient Instructions (Signed)

## 2015-11-26 LAB — WET PREP, GENITAL: TRICH WET PREP: NONE SEEN

## 2015-11-30 ENCOUNTER — Ambulatory Visit (INDEPENDENT_AMBULATORY_CARE_PROVIDER_SITE_OTHER): Payer: Medicaid Other | Admitting: Obstetrics & Gynecology

## 2015-11-30 VITALS — BP 111/70 | HR 81 | Wt 239.2 lb

## 2015-11-30 DIAGNOSIS — Z9884 Bariatric surgery status: Secondary | ICD-10-CM

## 2015-11-30 DIAGNOSIS — O99842 Bariatric surgery status complicating pregnancy, second trimester: Secondary | ICD-10-CM | POA: Diagnosis present

## 2015-11-30 DIAGNOSIS — B373 Candidiasis of vulva and vagina: Secondary | ICD-10-CM | POA: Insufficient documentation

## 2015-11-30 DIAGNOSIS — R55 Syncope and collapse: Secondary | ICD-10-CM | POA: Diagnosis not present

## 2015-11-30 DIAGNOSIS — B3731 Acute candidiasis of vulva and vagina: Secondary | ICD-10-CM | POA: Insufficient documentation

## 2015-11-30 DIAGNOSIS — B9689 Other specified bacterial agents as the cause of diseases classified elsewhere: Secondary | ICD-10-CM | POA: Insufficient documentation

## 2015-11-30 DIAGNOSIS — N760 Acute vaginitis: Secondary | ICD-10-CM

## 2015-11-30 NOTE — Progress Notes (Signed)
Subjective:  Carrie Cardenas is a 25 y.o. G1P0000 at 2322w6d being seen today for ongoing prenatal care.  She is currently monitored for the following issues for this high-risk pregnancy and has Supervision of normal first pregnancy in first trimester and History of gastric restrictive surgery on her problem list.  Patient reports syncope and near syncope on three occations in last 6 days, responded to rest and  a snack.  Contractions: Not present. Vag. Bleeding: None.  Movement: Present. Denies leaking of fluid.   The following portions of the patient's history were reviewed and updated as appropriate: allergies, current medications, past family history, past medical history, past social history, past surgical history and problem list. Problem list updated.  Objective:   Filed Vitals:   11/30/15 1502  BP: 111/70  Pulse: 81  Weight: 239 lb 3.2 oz (108.5 kg)    Fetal Status: Fetal Heart Rate (bpm): 143   Movement: Present     General:  Alert, oriented and cooperative. Patient is in no acute distress.  Skin: Skin is warm and dry. No rash noted.   Cardiovascular: Normal heart rate noted  Respiratory: Normal respiratory effort, no problems with respiration noted  Abdomen: Soft, gravid, appropriate for gestational age. Pain/Pressure: Absent     Pelvic: Cervical exam deferred        Extremities: Normal range of motion.  Edema: None  Mental Status: Normal mood and affect. Normal behavior. Normal judgment and thought content.   Urinalysis:      Assessment and Plan:  Pregnancy: G1P0000 at 3522w6d  1. History of gastric restrictive surgery Suspect nutritional insufficiency, needs assessment  2. Syncope, unspecified syncope type Se above  Preterm labor symptoms and general obstetric precautions including but not limited to vaginal bleeding, contractions, leaking of fluid and fetal movement were reviewed in detail with the patient. Please refer to After Visit Summary for other counseling  recommendations.  3 day to have labs and nutrition consult CBC and B12 level  Adam PhenixJames G Shamonique Battiste, MD

## 2015-11-30 NOTE — Progress Notes (Signed)
Pt reports fainting x 3 last week one of the times was at the Clinics

## 2015-11-30 NOTE — Patient Instructions (Signed)
Diet Following Bariatric Surgery The bariatric diet is designed to provide fluids and nourishment while promoting weight loss and healing after bariatric surgery. The diet is divided into 3 stages. Progress to each stage of the diet with your health care provider's approval.  WHAT DO I NEED TO KNOW ABOUT MY DIET FOLLOWING BARIATRIC SURGERY? Your surgeon may have individual guidelines for you about specific foods or the progression of your diet. Follow your surgeon's guidelines. You will follow these general guidelines during all stages of your diet:  Eat at set times.  Allow 30-45 minutes for each meal.  Take small bites. Chew your food until it is almost a liquid before swallowing it. Try setting down your utensils between bites to help yourself eat slower or make an "eat slowly" reminder sign.  Do not drink liquids for 30 minutes before meals and for 30 minutes after meals.  Drink between meals.  Stop eating when you are full. If you feel tightness or pressure in your chest, that means you are full. Wait 30 minutes before you try to eat again.  Take a chewable multivitamin daily in addition to other supplements as directed by your health care provider.  Sip at least 48-64 oz of liquid, preferably water, each day.  Stay away from concentrated sweets with more than 10 g of sugar per serving.  Protein is a very important part of the diet. Have protein with every meal when possible. Try eating your protein food first. STAGE 1 BARIATRIC DIET Stage 1 will begin after surgery and last until about 2 weeks after surgery or as directed by your health care provider. You will be on clear liquids immediately after surgery. After your health care provider approves, you will move to full liquids. You will eat at scheduled times during the day (for example at 8 AM, 12 noon, or 5 PM). You will also take a liquid protein supplement as recommended by your dietitian. Your dietitian will let you know how much  and how often you can eat.  Diet Guidelines  Limit intake to  cup of solid foods and  cup of beverages per meal.  You will need at least 60-80 g of protein daily or as determined by your dietitian. Guidelines for choosing a liquid protein supplement include:  At least 15 g of protein per 8 oz serving.  Less than 20 g total carbohydrate per 8 oz serving.  Less than 5 g fat per 8 oz serving.  Drink at least 48 oz (1440 mL) of fluid daily, which includes your protein supplement.  To get more protein, you can add 1 Tbsp non-fat dry milk powder to each  cup skim milk.  Avoid carbonated beverages, caffeine, alcohol, and concentrated sweets such as sugar, cakes, and cookies.  Take a chewable multivitamin complete with iron. Discuss additional supplements with your health care provider or dietitian. Beverages ( cup total per meal)  Decaffeinated coffee or tea.  Drinks with less than 25 g of sugar per serving.  Diet or sugar-free drinks.  Powdered drinks.  Sugar-free iced tea.  Broth.  Skim milk or lactose-free milk.  Unsweetened, plain soy milk.  Sugar-free gelatin dessert or frozen ice pops. Full Liquid Foods ( cup total per meal)  Protein-rich liquids (limit added protein powder to 25-30 g per serving).  Low-fat cream soup or soup that has been blended.  Artificially sweetened yogurt.  Sugar-free pudding.  Blended low-fat cottage cheese.  Plain yogurt or Greek yogurt (low-fat).  Unsweetened applesauce.    Hot wheat cereal, cream of rice, grits. STAGE 2 BARIATRIC DIET (SOFT OR PUREED DIET)  Stage 2 starts about 2 weeks after surgery and lasts until about 4 weeks after surgery. During this stage, you will eat soft, moist, ground, diced, or pureed foods in small meals, 3-6 times a day. Focus on protein-rich foods. You will also drink a liquid protein supplement between meals 2 times a day. After a week of soft protein foods, you can begin to add other soft foods in  addition to soft proteins. You should meet with your dietitian at this stage to begin preparation for Stage 3 of the bariatric diet.  Diet Guidelines  Meals should not exceed -1 cup total per meal.  You will need to blend solid foods to the consistency of applesauce.  Choose low-fat foods. Low-fat foods are foods with less than 5 g of fat per serving.  Include a protein with every meal and snack. Eat the protein food first. Try to eat 60-80 g of protein per day when possible.  Choose grains made from white or refined grain. Choices should have no more than 2 g of fiber per serving.  Continue to eat mindfully and slowly and always listen to your body.  Staying hydrated is very important during this stage. Full liquids from Stage 1 may be used for a meal or snack replacement.   Slowly add other soft foods to your diet. Examples of soft foods that can be added to your diet are listed in the following section. Soft Protein Foods  Well-cooked beans and lentils.  Eggs (scrambled, soft-boiled).  Tofu and other soft soy products (tempeh or bean veggie burgers).  Fish.  Lean poultry, well cooked and soft. Can also try baby food chicken or [place].  Ground meats.  Low-fat cottage cheese.  Hummus.  Fat-free or low-fat yogurt.  Gravy and light mayonnaise (to help moisten). Other Soft Foods  Soft fruit. This includes soft canned fruit in light syrup or natural juice, bananas, melons, peaches, pears, and strawberries.   Well-cooked vegetables.   Toast or crackers. Make sure these become soft by chewing them at least 20 times.  Hot wheat cereal.   Unflavored oatmeal.   Baby food or toddler fruits and vegetables.   Chicken or vegetable broth.   Blended fruit smoothies. STAGE 3 BARIATRIC DIET (REGULAR DIET) This stage starts about 6-8 weeks after surgery and will continue to promote weight loss. You will be allowed to eat foods of various textures. Ask your dietitian  to assist you in meal planning and additional behavioral strategies to make this final stage a long-term success. Diet Guidelines  Meals should not exceed -1 cup. As you heal and advance, you may be able to eat a little more with each meal. Always listen to your body.   Your diet should include foods from all the food groups.  Slowly add recommended foods to your diet. See the following section for a list of recommended foods.  Eat only at your chosen meal times.  When you feel full, stop eating.  Carbohydrates should be limited. Eat no more than 30 g per meal or 130 g per day. There are about 15-20 g of carbohydrates in 1 piece of bread or a medium piece of fruit.  Stay hydrated. Drink at least 48-64 oz of noncarbonated, zero-calorie fluid per day. Water is the best choice.  At first, avoid hard-to-digest foods like popcorn, nuts, celery, seeds, and the white parts of citrus fruits. With  time you may be able to eat these foods.  Take any vitamin supplements as directed by your health care provider.  Do not fast or skip meals. If you are having a hard time eating, talk to your health care provider or dietitian. WHAT FOODS CAN I EAT IN STAGE 3? Grains Choose whole grains when possible; aim to get half of your total grains as whole grains. These include whole wheat breads, crackers, and pastas. Cold or hot cereals with no added sugars. Rice (brown or white).  Vegetables Choose a variety of vegetables. All are allowed.  Fruits Choose a variety of fruits. All are allowed.  Meat and Other Protein Foods Choose lean sources of protein such as poultry, fish, and eggs. You may need to cook meats to tender at first. Smooth nut butters. Beans. Dairy Choose low-fat or nonfat dairy items (such as cheese, milk, and yogurt). Beverages Decaffeinated coffee. Caffeine-free tea. Sugar-free soft drinks without caffeine. Limit alcohol.  Condiments All are acceptable. Choose low-fat and  low-sodium when possible.  Sweets and Desserts Low-fat, low-sugar options. As part of a healthy diet, everyone should limit added sugars.  Fat and Oil Choose healthy fats such as olive oil, canola oil, and avocados.  The items listed above may not be a complete list of recommended foods or beverages. Contact your dietitian for more options.   This information is not intended to replace advice given to you by your health care provider. Make sure you discuss any questions you have with your health care provider.   Document Released: 12/11/2002 Document Revised: 06/11/2013 Document Reviewed: 04/10/2013 Elsevier Interactive Patient Education [employer]2016 Elsevier Inc.

## 2015-12-01 ENCOUNTER — Telehealth: Payer: Self-pay

## 2015-12-01 MED ORDER — METRONIDAZOLE 500 MG PO TABS: 500.0000 mg | ORAL_TABLET | Freq: Two times a day (BID) | ORAL | Status: DC

## 2015-12-01 MED ORDER — FLUCONAZOLE 150 MG PO TABS: 150.0000 mg | ORAL_TABLET | Freq: Once | ORAL | Status: DC

## 2015-12-01 NOTE — Telephone Encounter (Signed)
Per Leola Brazileidre Poe, CNM, pt was prescribed Flagyl 500 mg po bid x 7 days and Diflucan 150 mg x 1.  Attempted to contact pt x 2 unable to LM due to VM box not set up.  Note left in pt's appt for tomorrow to make sure she picks up Rx.

## 2015-12-02 ENCOUNTER — Ambulatory Visit: Payer: Medicaid Other

## 2015-12-03 ENCOUNTER — Ambulatory Visit: Payer: Medicaid Other

## 2015-12-09 ENCOUNTER — Ambulatory Visit: Payer: Medicaid Other | Admitting: *Deleted

## 2015-12-09 DIAGNOSIS — O368390 Maternal care for abnormalities of the fetal heart rate or rhythm, unspecified trimester, not applicable or unspecified: Secondary | ICD-10-CM

## 2015-12-09 NOTE — Progress Notes (Signed)
FHT by doppler 147, regular

## 2015-12-16 ENCOUNTER — Ambulatory Visit (INDEPENDENT_AMBULATORY_CARE_PROVIDER_SITE_OTHER): Payer: Medicaid Other | Admitting: Obstetrics & Gynecology

## 2015-12-16 ENCOUNTER — Ambulatory Visit (INDEPENDENT_AMBULATORY_CARE_PROVIDER_SITE_OTHER): Payer: Medicaid Other | Admitting: Clinical

## 2015-12-16 VITALS — BP 107/65 | HR 68 | Ht 66.0 in | Wt 254.0 lb

## 2015-12-16 DIAGNOSIS — F4329 Adjustment disorder with other symptoms: Secondary | ICD-10-CM

## 2015-12-16 DIAGNOSIS — O98512 Other viral diseases complicating pregnancy, second trimester: Secondary | ICD-10-CM

## 2015-12-16 DIAGNOSIS — O98312 Other infections with a predominantly sexual mode of transmission complicating pregnancy, second trimester: Principal | ICD-10-CM

## 2015-12-16 DIAGNOSIS — O98319 Other infections with a predominantly sexual mode of transmission complicating pregnancy, unspecified trimester: Secondary | ICD-10-CM

## 2015-12-16 DIAGNOSIS — A630 Anogenital (venereal) warts: Secondary | ICD-10-CM

## 2015-12-16 NOTE — Addendum Note (Signed)
Addended by: Cheree DittoGRAHAM, Tyee Vandevoorde A on: 12/16/2015 03:30 PM   Modules accepted: Orders

## 2015-12-16 NOTE — Progress Notes (Signed)
  ASSESSMENT: Pt currently experiencing Adjustment disorder with other symptoms. Pt needs to f/u with OB. Pt would benefit from psychoeducation and brief therapeutic interventions regarding coping with emotional adjustment to new health diagnosis. [facility]. Stage of Change: precontemplative  PLAN: 1. F/U with behavioral health clinician in as needed 2. Psychiatric Medications: none 3. Behavioral recommendations:   -Read educational material about HPV, as requested -Consider practicing daily relaxation breathing technique, as practiced in office visit today  SUBJECTIVE: Pt. referred by Dr. Macon LargeAnyanwu for emotional reaction to new health diagnosis Pt. reports the following symptoms/concerns: Pt states that she had somewhat prepared herself for the possibility that she may have HPV, but the reality feels stressful; she has many questions about HPV, and is concerned that others may think less of her as a result of this new diagnosis. She says she is not angry now, but wants to cope with future anger; goal is to stay as stress-free as possible during pregnancy. Duration of problem: Today Severity: mild   OBJECTIVE: Orientation & Cognition: Oriented x3. Thought processes normal and appropriate to situation. Mood: appropriate Affect: appropriate Appearance: appropriate Risk of harm to self or others: no known risk of harm to self or others Substance use: none Assessments administered: none today  Diagnosis: Adjustment disorder with other symptom CPT Code: F43.29  -------------------------------------------- Other(s) present in the room: none  Time spent with patient in exam room: 30 minutes 3:05 to 3:35pm

## 2015-12-16 NOTE — Progress Notes (Signed)
FHR 155bpm. Patient reports fetal movement

## 2015-12-16 NOTE — Progress Notes (Signed)
   CLINIC ENCOUNTER NOTE  History:  25 y.o. G1P0000  at 5110w1d here today for fetal monitoring but wanted to be evaluated for worsening vulvar bumps. No irritation from them. Worried about STI.  She reports increased non-pruritic vaginal discharge, but no bleeding,contractions, LOF. Good FM  Patient Active Problem List   Diagnosis Date Noted  . BV (bacterial vaginosis) 11/30/2015  . Yeast infection of the vagina 11/30/2015  . Supervision of normal first pregnancy in first trimester 09/30/2015  . History of gastric restrictive surgery 09/30/2015    The following portions of the patient's history were reviewed and updated as appropriate: allergies, current medications, past family history, past medical history, past social history, past surgical history and problem list.    Review of Systems:  Pertinent items noted in HPI and remainder of comprehensive ROS otherwise negative.  Objective:  Physical Exam BP 107/65 mmHg  Pulse 68  Ht 5\' 6"  (1.676 m)  Wt 254 lb (115.214 kg)  BMI 41.02 kg/m2  LMP 07/07/2015 (Exact Date) CONSTITUTIONAL: Well-developed, well-nourished female gravid, in no acute distress. ABDOMEN: Nontender, gravid, obese  PELVIC: Multiple condylomata noted, biggest lesions are between labia on both sides about 1 cm in size. No erythema. Normal appearing discharge.  Wet prep obtained. MUSCULOSKELETAL: Normal range of motion. No edema noted.  Reassuring FHR tracing.  Assessment & Plan:  Genital warts complicating pregnancy, second trimester Diagnosis discussed with patient; discussed HPV etiology and that is is a STI. No treatment needed at this point, will continue to monitor. If needed, may consider cryotherapy vs TCA vs excision or defer management to postpartum Given patient's reaction, appointment was made [facility].  Will follow up as previously scheduled.  Total face-to-face time with patient: 15 minutes. Over 50% of encounter was  spent on counseling and coordination of care.   Jaynie CollinsUGONNA  Taylan Marez, MD, FACOG Attending Obstetrician & Gynecologist, [facility]

## 2015-12-17 ENCOUNTER — Telehealth: Payer: Self-pay | Admitting: *Deleted

## 2015-12-17 LAB — WET PREP, GENITAL
Trich, Wet Prep: NONE SEEN
Yeast Wet Prep HPF POC: NONE SEEN

## 2015-12-17 NOTE — Telephone Encounter (Signed)
I called Carrie Cardenas and she states she can't talk right now- can I call back later. I informed we would try to call back tomorrow.

## 2015-12-17 NOTE — Telephone Encounter (Signed)
Carrie Cardenas left a message yesterday afternoon stating she had questions about the hpv diagnosis she was given today. Wants to know what options she has.

## 2015-12-23 NOTE — Telephone Encounter (Signed)
I called Carrie Cardenas and left a message I am returning your call, if you still need to speak with someone, please call our office  and leave a message.

## 2015-12-24 ENCOUNTER — Encounter: Payer: Medicaid Other | Admitting: Obstetrics and Gynecology

## 2015-12-25 ENCOUNTER — Encounter: Payer: Medicaid Other | Admitting: Certified Nurse Midwife

## 2015-12-31 ENCOUNTER — Other Ambulatory Visit (HOSPITAL_COMMUNITY): Payer: Self-pay | Admitting: Maternal and Fetal Medicine

## 2015-12-31 ENCOUNTER — Encounter (HOSPITAL_COMMUNITY): Payer: Self-pay

## 2015-12-31 ENCOUNTER — Ambulatory Visit (HOSPITAL_COMMUNITY)
Admission: RE | Admit: 2015-12-31 | Discharge: 2015-12-31 | Disposition: A | Discharge status: Home / Self Care | Payer: Medicaid Other | Source: Ambulatory Visit | Attending: Certified Nurse Midwife | Admitting: Certified Nurse Midwife

## 2015-12-31 VITALS — BP 115/60 | HR 85 | Wt 256.5 lb

## 2015-12-31 DIAGNOSIS — Z6841 Body Mass Index (BMI) 40.0 and over, adult: Secondary | ICD-10-CM | POA: Insufficient documentation

## 2015-12-31 DIAGNOSIS — Z3A25 25 weeks gestation of pregnancy: Secondary | ICD-10-CM | POA: Diagnosis not present

## 2015-12-31 DIAGNOSIS — O98319 Other infections with a predominantly sexual mode of transmission complicating pregnancy, unspecified trimester: Secondary | ICD-10-CM

## 2015-12-31 DIAGNOSIS — Z0489 Encounter for examination and observation for other specified reasons: Secondary | ICD-10-CM

## 2015-12-31 DIAGNOSIS — O409XX0 Polyhydramnios, unspecified trimester, not applicable or unspecified: Secondary | ICD-10-CM | POA: Diagnosis present

## 2015-12-31 DIAGNOSIS — IMO0002 Reserved for concepts with insufficient information to code with codable children: Secondary | ICD-10-CM

## 2015-12-31 DIAGNOSIS — E669 Obesity, unspecified: Secondary | ICD-10-CM | POA: Diagnosis not present

## 2015-12-31 DIAGNOSIS — Z36 Encounter for antenatal screening of mother: Secondary | ICD-10-CM | POA: Insufficient documentation

## 2015-12-31 DIAGNOSIS — Z3401 Encounter for supervision of normal first pregnancy, first trimester: Secondary | ICD-10-CM

## 2015-12-31 DIAGNOSIS — Z9884 Bariatric surgery status: Secondary | ICD-10-CM

## 2015-12-31 DIAGNOSIS — O99842 Bariatric surgery status complicating pregnancy, second trimester: Secondary | ICD-10-CM

## 2015-12-31 DIAGNOSIS — A630 Anogenital (venereal) warts: Secondary | ICD-10-CM

## 2015-12-31 DIAGNOSIS — O99212 Obesity complicating pregnancy, second trimester: Secondary | ICD-10-CM

## 2016-01-08 ENCOUNTER — Encounter: Payer: Self-pay | Admitting: Obstetrics & Gynecology

## 2016-01-08 ENCOUNTER — Encounter: Payer: Self-pay | Admitting: Advanced Practice Midwife

## 2016-01-11 ENCOUNTER — Encounter: Payer: Self-pay | Admitting: Family Medicine

## 2016-01-11 ENCOUNTER — Telehealth: Payer: Self-pay | Admitting: Family Medicine

## 2016-01-11 NOTE — Telephone Encounter (Signed)
Patient has missed several OB appointments. I have tried to call, but voice mail is full. Will send certified letter.

## 2016-01-12 ENCOUNTER — Encounter (HOSPITAL_COMMUNITY): Payer: Self-pay | Admitting: Anesthesiology

## 2016-01-12 ENCOUNTER — Encounter (HOSPITAL_COMMUNITY): Payer: Self-pay | Admitting: *Deleted

## 2016-01-12 ENCOUNTER — Telehealth: Payer: Self-pay | Admitting: Obstetrics and Gynecology

## 2016-01-12 ENCOUNTER — Telehealth: Payer: Self-pay | Admitting: Advanced Practice Midwife

## 2016-01-12 ENCOUNTER — Inpatient Hospital Stay (HOSPITAL_COMMUNITY)
Admission: AD | Admit: 2016-01-12 | Discharge: 2016-01-12 | Disposition: A | Discharge status: Home / Self Care | Payer: Medicaid Other | Source: Ambulatory Visit | Attending: Family Medicine | Admitting: Family Medicine

## 2016-01-12 ENCOUNTER — Ambulatory Visit (HOSPITAL_COMMUNITY)
Admission: RE | Admit: 2016-01-12 | Discharge: 2016-01-12 | Disposition: A | Discharge status: Home / Self Care | Payer: Medicaid Other | Source: Ambulatory Visit | Attending: Obstetrics and Gynecology | Admitting: Obstetrics and Gynecology

## 2016-01-12 ENCOUNTER — Encounter (INDEPENDENT_AMBULATORY_CARE_PROVIDER_SITE_OTHER): Payer: Self-pay

## 2016-01-12 DIAGNOSIS — Z3A27 27 weeks gestation of pregnancy: Secondary | ICD-10-CM | POA: Insufficient documentation

## 2016-01-12 DIAGNOSIS — O26892 Other specified pregnancy related conditions, second trimester: Secondary | ICD-10-CM | POA: Diagnosis not present

## 2016-01-12 DIAGNOSIS — R600 Localized edema: Secondary | ICD-10-CM | POA: Insufficient documentation

## 2016-01-12 DIAGNOSIS — Z9884 Bariatric surgery status: Secondary | ICD-10-CM | POA: Diagnosis not present

## 2016-01-12 DIAGNOSIS — D649 Anemia, unspecified: Secondary | ICD-10-CM | POA: Diagnosis not present

## 2016-01-12 DIAGNOSIS — R42 Dizziness and giddiness: Secondary | ICD-10-CM | POA: Insufficient documentation

## 2016-01-12 DIAGNOSIS — O99012 Anemia complicating pregnancy, second trimester: Secondary | ICD-10-CM | POA: Insufficient documentation

## 2016-01-12 DIAGNOSIS — Z87898 Personal history of other specified conditions: Secondary | ICD-10-CM

## 2016-01-12 DIAGNOSIS — R202 Paresthesia of skin: Secondary | ICD-10-CM | POA: Insufficient documentation

## 2016-01-12 DIAGNOSIS — O2342 Unspecified infection of urinary tract in pregnancy, second trimester: Secondary | ICD-10-CM | POA: Insufficient documentation

## 2016-01-12 DIAGNOSIS — O1202 Gestational edema, second trimester: Secondary | ICD-10-CM

## 2016-01-12 DIAGNOSIS — M7989 Other specified soft tissue disorders: Secondary | ICD-10-CM | POA: Diagnosis present

## 2016-01-12 LAB — URINALYSIS, ROUTINE W REFLEX MICROSCOPIC
GLUCOSE, UA: NEGATIVE mg/dL
KETONES UR: 15 mg/dL — AB
NITRITE: NEGATIVE
PROTEIN: 30 mg/dL — AB
Specific Gravity, Urine: >1.03 — ABNORMAL HIGH (ref 1.005–1.030)
pH: 6 (ref 5.0–8.0)

## 2016-01-12 LAB — COMPREHENSIVE METABOLIC PANEL
ALT: 63 U/L — AB (ref 14–54)
AST: 43 U/L — AB (ref 15–41)
Albumin: 3 g/dL — ABNORMAL LOW (ref 3.5–5.0)
Alkaline Phosphatase: 60 U/L (ref 38–126)
Anion gap: 6 (ref 5–15)
BILIRUBIN TOTAL: 0.3 mg/dL (ref 0.3–1.2)
BUN: 11 mg/dL (ref 6–20)
CHLORIDE: 107 mmol/L (ref 101–111)
CO2: 24 mmol/L (ref 22–32)
CREATININE: 0.53 mg/dL (ref 0.44–1.00)
Calcium: 8.8 mg/dL — ABNORMAL LOW (ref 8.9–10.3)
Glucose, Bld: 84 mg/dL (ref 65–99)
Potassium: 3.3 mmol/L — ABNORMAL LOW (ref 3.5–5.1)
Sodium: 137 mmol/L (ref 135–145)
TOTAL PROTEIN: 6.5 g/dL (ref 6.5–8.1)

## 2016-01-12 LAB — CBC
HEMATOCRIT: 25.6 % — AB (ref 36.0–46.0)
Hemoglobin: 8.5 g/dL — ABNORMAL LOW (ref 12.0–15.0)
MCH: 28.7 pg (ref 26.0–34.0)
MCHC: 33.2 g/dL (ref 30.0–36.0)
MCV: 86.5 fL (ref 78.0–100.0)
Platelets: 194 10*3/uL (ref 150–400)
RBC: 2.96 MIL/uL — ABNORMAL LOW (ref 3.87–5.11)
RDW: 13.8 % (ref 11.5–15.5)
WBC: 4.9 10*3/uL (ref 4.0–10.5)

## 2016-01-12 LAB — URINE MICROSCOPIC-ADD ON

## 2016-01-12 LAB — RAPID URINE DRUG SCREEN, HOSP PERFORMED
Amphetamines: NOT DETECTED
BARBITURATES: NOT DETECTED
Benzodiazepines: NOT DETECTED
COCAINE: NOT DETECTED
Opiates: NOT DETECTED
Tetrahydrocannabinol: NOT DETECTED

## 2016-01-12 LAB — PROTEIN / CREATININE RATIO, URINE
CREATININE, URINE: 236 mg/dL
PROTEIN CREATININE RATIO: 0.11 mg/mg{creat} (ref 0.00–0.15)
TOTAL PROTEIN, URINE: 26 mg/dL

## 2016-01-12 LAB — LIPASE, BLOOD: Lipase: 21 U/L (ref 11–51)

## 2016-01-12 LAB — PREALBUMIN: PREALBUMIN: 15.5 mg/dL — AB (ref 18–38)

## 2016-01-12 MED ORDER — POTASSIUM CHLORIDE CRYS ER 20 MEQ PO TBCR
40.0000 meq | EXTENDED_RELEASE_TABLET | Freq: Once | ORAL | Status: AC
  Administered 2016-01-12: 40 meq via ORAL
  Filled 2016-01-12: qty 2

## 2016-01-12 MED ORDER — LACTATED RINGERS IV SOLN
INTRAVENOUS | Status: DC
  Administered 2016-01-12: 03:00:00 via INTRAVENOUS

## 2016-01-12 MED ORDER — CEPHALEXIN 500 MG PO CAPS: 500.0000 mg | ORAL_CAPSULE | Freq: Four times a day (QID) | ORAL | 0 refills | Status: DC

## 2016-01-12 MED ORDER — FERROUS SULFATE 325 (65 FE) MG PO TABS: 325.0000 mg | ORAL_TABLET | Freq: Every day | ORAL | 3 refills | Status: DC

## 2016-01-12 MED ORDER — FERUMOXYTOL INJECTION 510 MG/17 ML
510.0000 mg | Freq: Once | INTRAVENOUS | Status: AC
  Administered 2016-01-12: 510 mg via INTRAVENOUS
  Filled 2016-01-12: qty 17

## 2016-01-12 MED ORDER — ENOXAPARIN SODIUM 120 MG/0.8ML ~~LOC~~ SOLN
1.0000 mg/kg | Freq: Once | SUBCUTANEOUS | Status: AC
  Administered 2016-01-12: 120 mg via SUBCUTANEOUS
  Filled 2016-01-12: qty 1.6

## 2016-01-12 NOTE — MAU Provider Note (Signed)
History     CSN: [id number]  Arrival date and time: 01/12/16 0001   First Provider Initiated Contact with Patient 01/12/16 0116      Chief Complaint  Patient presents with  . swelling   HPI   Carrie Cardenas is 25 y.o. female G1P0000 @ 55w0dwith a history of gastric sleeve surgery (2015) and depression here with leg swelling; worse on the left foot.  Today she started having left leg swelling. Shortly after she noticed her left leg swelling she also noted it in her right leg. She has occasional tingling in both feet, however denies pain.   Per the patients record [place] she states that she has had transportation issues. Says her grandmother is in her final stages of CA and she has had to make other arrangements for living.   She states that she has passed out a "few times in this pregnancy". Dr. ARoselie Awkwardnoted this in previous visit.   She has had very minimal to drink today and is unable to provide uKore[place]ith a urine sample   OB History    Gravida Para Term Preterm AB Living   1 0 0 0 0 0   SAB TAB Ectopic Multiple Live Births   0 0 0 0        Past Medical History:  Diagnosis Date  . Depression    pt hospitalized at 25years old for major depression    Past Surgical History:  Procedure Laterality Date  . GASTRIC BYPASS    . SLEEVE GASTROPLASTY  2015    Family History  Problem Relation Age of Onset  . Fibroids Mother   . Anemia Mother   . Pulmonary embolism Mother   . Cancer Maternal Grandmother     Social History  Substance Use Topics  . Smoking status: Never Smoker  . Smokeless tobacco: Never Used  . Alcohol use No    Allergies: No Known Allergies  Prescriptions Prior to Admission  Medication Sig Dispense Refill Last Dose  . fluconazole (DIFLUCAN) 150 MG tablet Take 1 now and repeat in 2 days if needed (Patient not taking: Reported on 11/30/2015) 2 tablet 0 Not Taking  . fluconazole (DIFLUCAN) 150 MG tablet  Take 1 tablet (150 mg total) by mouth once. (Patient not taking: Reported on 12/31/2015) 1 tablet 0 Not Taking  . metroNIDAZOLE (FLAGYL) 500 MG tablet Take 1 tablet (500 mg total) by mouth 2 (two) times daily. (Patient not taking: Reported on 12/31/2015) 14 tablet 0 Not Taking  . Prenatal Vit-Fe Fumarate-FA (MULTIVITAMIN-PRENATAL) 27-0.8 MG TABS tablet Take 1 tablet by mouth daily at 12 noon.   Taking   Results for orders placed or performed during the hospital encounter of 01/12/16 (from the past 48 hour(s))  Comprehensive metabolic panel     Status: Abnormal   Collection Time: 01/12/16  1:25 AM  Result Value Ref Range   Sodium 137 135 - 145 mmol/L   Potassium 3.3 (L) 3.5 - 5.1 mmol/L   Chloride 107 101 - 111 mmol/L   CO2 24 22 - 32 mmol/L   Glucose, Bld 84 65 - 99 mg/dL   BUN 11 6 - 20 mg/dL   Creatinine, Ser 0.53 0.44 - 1.00 mg/dL   Calcium 8.8 (L) 8.9 - 10.3 mg/dL   Total Protein 6.5 6.5 - 8.1 g/dL   Albumin 3.0 (L) 3.5 - 5.0 g/dL   AST 43 (H) 15 - 41 U/L  ALT 63 (H) 14 - 54 U/L   Alkaline Phosphatase 60 38 - 126 U/L   Total Bilirubin 0.3 0.3 - 1.2 mg/dL   GFR calc non Af Amer >60 >60 mL/min   GFR calc Af Amer >60 >60 mL/min    Comment: (NOTE) The eGFR has been calculated using the CKD EPI equation. This calculation has not been validated in all clinical situations. eGFR's persistently <60 mL/min signify possible Chronic Kidney Disease.    Anion gap 6 5 - 15  CBC     Status: Abnormal   Collection Time: 01/12/16  1:25 AM  Result Value Ref Range   WBC 4.9 4.0 - 10.5 K/uL   RBC 2.96 (L) 3.87 - 5.11 MIL/uL   Hemoglobin 8.5 (L) 12.0 - 15.0 g/dL   HCT 89.0 (L) 98.3 - 86.1 %   MCV 86.5 78.0 - 100.0 fL   MCH 28.7 26.0 - 34.0 pg   MCHC 33.2 30.0 - 36.0 g/dL   RDW 79.9 99.5 - 14.5 %   Platelets 194 150 - 400 K/uL  Prealbumin     Status: Abnormal   Collection Time: 01/12/16  1:25 AM  Result Value Ref Range   Prealbumin 15.5 (L) 18 - 38 mg/dL    Comment: Performed at [facility]  Lipase, blood     Status: None   Collection Time: 01/12/16  1:25 AM  Result Value Ref Range   Lipase 21 11 - 51 U/L  Urine rapid drug screen (hosp performed)     Status: None   Collection Time: 01/12/16  2:37 AM  Result Value Ref Range   Opiates NONE DETECTED NONE DETECTED   Cocaine NONE DETECTED NONE DETECTED   Benzodiazepines NONE DETECTED NONE DETECTED   Amphetamines NONE DETECTED NONE DETECTED   Tetrahydrocannabinol NONE DETECTED NONE DETECTED   Barbiturates NONE DETECTED NONE DETECTED    Comment:        DRUG SCREEN FOR MEDICAL PURPOSES ONLY.  IF CONFIRMATION IS NEEDED FOR ANY PURPOSE, NOTIFY LAB WITHIN 5 DAYS.        LOWEST DETECTABLE LIMITS FOR URINE DRUG SCREEN Drug Class       Cutoff (ng/mL) Amphetamine      1000 Barbiturate      200 Benzodiazepine   200 Tricyclics       300 Opiates          300 Cocaine          300 THC              50   Urinalysis, Routine w reflex microscopic (not at [facility])     Status: Abnormal   Collection Time: 01/12/16  2:37 AM  Result Value Ref Range   Color, Urine YELLOW YELLOW   APPearance TURBID (A) CLEAR   Specific Gravity, Urine >1.030 (H) 1.005 - 1.030   pH 6.0 5.0 - 8.0   Glucose, UA NEGATIVE NEGATIVE mg/dL   Hgb urine dipstick MODERATE (A) NEGATIVE   Bilirubin Urine SMALL (A) NEGATIVE   Ketones, ur 15 (A) NEGATIVE mg/dL   Protein, ur 30 (A) NEGATIVE mg/dL   Nitrite NEGATIVE NEGATIVE   Leukocytes, UA SMALL (A) NEGATIVE  Protein / creatinine ratio, urine     Status: None   Collection Time: 01/12/16  2:37 AM  Result Value Ref Range   Creatinine, Urine 236.00 mg/dL    Comment: RESULTS CONFIRMED BY MANUAL DILUTION   Total Protein, Urine 26 mg/dL    Comment: NO NORMAL RANGE  ESTABLISHED FOR THIS TEST   Protein Creatinine Ratio 0.11 0.00 - 0.15 mg/mg[Cre]  Urine microscopic-add on     Status: Abnormal   Collection Time: 01/12/16  2:37 AM  Result Value Ref Range   Squamous Epithelial / LPF 6-30 (A) NONE SEEN   WBC, UA  6-30 0 - 5 WBC/hpf   RBC / HPF 0-5 0 - 5 RBC/hpf   Bacteria, UA MANY (A) NONE SEEN   Crystals CA OXALATE CRYSTALS (A) NEGATIVE   Urine-Other MUCOUS PRESENT    Review of Systems  Constitutional: Negative for chills and fever.  Eyes: Negative for blurred vision.  Respiratory: Negative for cough and shortness of breath.   Cardiovascular: Positive for leg swelling. Negative for chest pain and palpitations.  Gastrointestinal: Positive for nausea and vomiting (random, not everyday. None in the last 3-4 days). Negative for abdominal pain, constipation and diarrhea.  Neurological: Positive for dizziness. Negative for headaches.  Psychiatric/Behavioral: Positive for depression. Negative for hallucinations, memory loss, substance abuse and suicidal ideas. The patient is not nervous/anxious and does not have insomnia.    Physical Exam   Blood pressure 124/74, pulse 85, temperature 98.3 F (36.8 C), temperature source Oral, resp. rate 20, height _0  (1.676 m), weight 267 lb (121.1 kg), last menstrual period 07/07/2015, SpO2 100 %.  Physical Exam  Constitutional: She is oriented to person, place, and time. She appears well-developed and well-nourished. No distress.  HENT:  Head: Normocephalic.  Eyes: Pupils are equal, round, and reactive to light.  Cardiovascular: Normal rate and normal heart sounds.   Respiratory: Effort normal and breath sounds normal. No respiratory distress. She has no wheezes. She has no rales. She exhibits no tenderness.  Genitourinary:  Genitourinary Comments: Cervix: closed, thick, posterior. Strong body odor noted   Musculoskeletal:       Left ankle: She exhibits decreased range of motion and swelling. She exhibits normal pulse. No tenderness.  Neurological: She is alert and oriented to person, place, and time.  Skin: She is not diaphoretic. There is pallor.  Psychiatric: She has a normal mood and affect. Her behavior is normal. Judgment and thought content normal.    Fetal Tracing: Baseline: 140 bpm  Variability: moderate  Accelerations: 10x10 Decelerations: quick variables  Toco: quiet   MAU Course  Procedures  None   MDM  Patient remains 100% on RA Left calf measures 19 1/4 inches Right calf measures 18 inches   Bladder scanner showed 111 ml> Po fluids offered  EKG normal   Prealbumin  Lipase  UDS- pending  Will given lovenox prior to DC   Feraheme 510 IV LR @ 500 ml-hr  Message left with [facility] @ [phone] to notify them of patient's pending arrival at 0800. Discussed patient with Dr. Kennon Rounds  Urine culture pending   Assessment and Plan    A:  1. Anemia in pregnancy, second trimester   2. History of gastric restrictive surgery   3. UTI in pregnancy, antepartum, second trimester   4. Hx of dizziness   5. Leg edema, left     P:  Discharge home in stable condition  Patient to go to [facility] @ 0800 for doppler study. PE/DVT precautions Return to MAU if symptoms worsen Urine culture pending  Discussed diet; increase protein in diet.  Call the [facility] to schedule next OB visit. Rx: Keflex, iron   Lezlie Lye, NP 01/13/2016 1:28 PM

## 2016-01-12 NOTE — Discharge Instructions (Signed)
Anemia, Nonspecific Anemia is a condition in which the concentration of red blood cells or hemoglobin in the blood is below normal. Hemoglobin is a substance in red blood cells that carries oxygen to the tissues of the body. Anemia results in not enough oxygen reaching these tissues.  CAUSES  Common causes of anemia include:   Excessive bleeding. Bleeding may be internal or external. This includes excessive bleeding from periods (in women) or from the intestine.   Poor nutrition.   Chronic kidney, thyroid, and liver disease.  Bone marrow disorders that decrease red blood cell production.  Cancer and treatments for cancer.  HIV, AIDS, and their treatments.  Spleen problems that increase red blood cell destruction.  Blood disorders.  Excess destruction of red blood cells due to infection, medicines, and autoimmune disorders. SIGNS AND SYMPTOMS   Minor weakness.   Dizziness.   Headache.  Palpitations.   Shortness of breath, especially with exercise.   Paleness.  Cold sensitivity.  Indigestion.  Nausea.  Difficulty sleeping.  Difficulty concentrating. Symptoms may occur suddenly or they may develop slowly.  DIAGNOSIS  Additional blood tests are often needed. These help your health care provider determine the best treatment. Your health care provider will check your stool for blood and look for other causes of blood loss.  TREATMENT  Treatment varies depending on the cause of the anemia. Treatment can include:   Supplements of iron, vitamin B12, or folic acid.   Hormone medicines.   A blood transfusion. This may be needed if blood loss is severe.   Hospitalization. This may be needed if there is significant continual blood loss.   Dietary changes.  Spleen removal. HOME CARE INSTRUCTIONS Keep all follow-up appointments. It often takes many weeks to correct anemia, and having your health care provider check on your condition and your response to  treatment is very important. SEEK IMMEDIATE MEDICAL CARE IF:   You develop extreme weakness, shortness of breath, or chest pain.   You become dizzy or have trouble concentrating.  You develop heavy vaginal bleeding.   You develop a rash.   You have bloody or black, tarry stools.   You faint.   You vomit up blood.   You vomit repeatedly.   You have abdominal pain.  You have a fever or persistent symptoms for more than 2-3 days.   You have a fever and your symptoms suddenly get worse.   You are dehydrated.  MAKE SURE YOU:  Understand these instructions.  Will watch your condition.  Will get help right away if you are not doing well or get worse.   This information is not intended to replace advice given to you by your health care provider. Make sure you discuss any questions you have with your health care provider.   Document Released: 07/14/2004 Document Revised: 02/06/2013 Document Reviewed: 11/30/2012 Elsevier Interactive Patient Education 2016 Elsevier Inc. Pregnancy and Urinary Tract Infection A urinary tract infection (UTI) is a bacterial infection of the urinary tract. Infection of the urinary tract can include the ureters, kidneys (pyelonephritis), bladder (cystitis), and urethra (urethritis). All pregnant women should be screened for bacteria in the urinary tract. Identifying and treating a UTI will decrease the risk of preterm labor and developing more serious infections in both the mother and baby. CAUSES Bacteria germs cause almost all UTIs.  RISK FACTORS Many factors can increase your chances of getting a UTI during pregnancy. These include:  Having a short urethra.  Poor toilet and hygiene habits.  Sexual intercourse.  Blockage of urine along the urinary tract.  Problems with the pelvic muscles or nerves.  Diabetes.  Obesity.  Bladder problems after having several children.  Previous history of UTI. SIGNS AND SYMPTOMS   Pain,  burning, or a stinging feeling when urinating.  Suddenly feeling the need to urinate right away (urgency).  Loss of bladder control (urinary incontinence).  Frequent urination, more than is common with pregnancy.  Lower abdominal or back discomfort.  Cloudy urine.  Blood in the urine (hematuria).  Fever. When the kidneys are infected, the symptoms may be:  Back pain.  Flank pain on the right side more so than the left.  Fever.  Chills.  Nausea.  Vomiting. DIAGNOSIS  A urinary tract infection is usually diagnosed through urine tests. Additional tests and procedures are sometimes done. These may include:  Ultrasound exam of the kidneys, ureters, bladder, and urethra.  Looking in the bladder with a lighted tube (cystoscopy). TREATMENT Typically, UTIs can be treated with antibiotic medicines.  HOME CARE INSTRUCTIONS   Only take over-the-counter or prescription medicines as directed by your health care provider. If you were prescribed antibiotics, take them as directed. Finish them even if you start to feel better.  Drink enough fluids to keep your urine clear or pale yellow.  Do not have sexual intercourse until the infection is gone and your health care provider says it is okay.  Make sure you are tested for UTIs throughout your pregnancy. These infections often come back. Preventing a UTI in the Future  Practice good toilet habits. Always wipe from front to back. Use the tissue only once.  Do not hold your urine. Empty your bladder as soon as possible when the urge comes.  Do not douche or use deodorant sprays.  Wash with soap and warm water around the genital area and the anus.  Empty your bladder before and after sexual intercourse.  Wear underwear with a cotton crotch.  Avoid caffeine and carbonated drinks. They can irritate the bladder.  Drink cranberry juice or take cranberry pills. This may decrease the risk of getting a UTI.  Do not drink  alcohol.  Keep all your appointments and tests as scheduled. SEEK MEDICAL CARE IF:   Your symptoms get worse.  You are still having fevers 2 or more days after treatment begins.  You have a rash.  You feel that you are having problems with medicines prescribed.  You have abnormal vaginal discharge. SEEK IMMEDIATE MEDICAL CARE IF:   You have back or flank pain.  You have chills.  You have blood in your urine.  You have nausea and vomiting.  You have contractions of your uterus.  You have a gush of fluid from the vagina. MAKE SURE YOU:  Understand these instructions.   Will watch your condition.   Will get help right away if you are not doing well or get worse.    This information is not intended to replace advice given to you by your health care provider. Make sure you discuss any questions you have with your health care provider.   Document Released: 10/01/2010 Document Revised: 03/27/2013 Document Reviewed: 01/03/2013 Elsevier Interactive Patient Education [employer]

## 2016-01-12 NOTE — Progress Notes (Signed)
VASCULAR LAB PRELIMINARY  PRELIMINARY  PRELIMINARY  PRELIMINARY  Left lower extremity venous duplex has been completed.     Left:  No evidence of DVT, superficial thrombosis, or Baker's cyst.  Bilateral: lymph nodes in groin areas.   Called Dr. Natale Lay with results.  Nury Nebergall, RVT, RDMS 01/12/2016, 1:39 PM

## 2016-01-12 NOTE — Telephone Encounter (Signed)
Received call from vascular lab the patient's lower extremity Dopplers were normal. Reviewed results with patient and sent in-basket message to see [facility] to schedule next return OB appointment and 28 week labs.

## 2016-01-12 NOTE — Telephone Encounter (Signed)
Patient was scheduled this morning @ 0800 for venous doppler study for possible L DVT. Patient was a no show to this appointment. I attempted to call the patient at every phone number listed with no answer.

## 2016-01-12 NOTE — MAU Note (Signed)
Pt reports swelling in her feet and ankles today.

## 2016-01-13 LAB — CULTURE, OB URINE: SPECIAL REQUESTS: NORMAL

## 2016-01-14 ENCOUNTER — Encounter: Payer: Self-pay | Admitting: Family Medicine

## 2016-01-18 ENCOUNTER — Other Ambulatory Visit: Payer: Medicaid Other

## 2016-01-18 DIAGNOSIS — Z3403 Encounter for supervision of normal first pregnancy, third trimester: Secondary | ICD-10-CM

## 2016-01-18 LAB — HIV ANTIBODY (ROUTINE TESTING W REFLEX): HIV 1&2 Ab, 4th Generation: NONREACTIVE

## 2016-01-18 LAB — CBC
HEMATOCRIT: 27.4 % — AB (ref 35.0–45.0)
Hemoglobin: 8.9 g/dL — ABNORMAL LOW (ref 11.7–15.5)
MCH: 27.9 pg (ref 27.0–33.0)
MCHC: 32.5 g/dL (ref 32.0–36.0)
MCV: 85.9 fL (ref 80.0–100.0)
MPV: 11 fL (ref 7.5–12.5)
Platelets: 208 10*3/uL (ref 140–400)
RBC: 3.19 MIL/uL — ABNORMAL LOW (ref 3.80–5.10)
RDW: 14.5 % (ref 11.0–15.0)
WBC: 4.9 10*3/uL (ref 3.8–10.8)

## 2016-01-19 ENCOUNTER — Encounter: Payer: Self-pay | Admitting: Obstetrics and Gynecology

## 2016-01-19 DIAGNOSIS — R87612 Low grade squamous intraepithelial lesion on cytologic smear of cervix (LGSIL): Secondary | ICD-10-CM | POA: Insufficient documentation

## 2016-01-19 DIAGNOSIS — IMO0002 Reserved for concepts with insufficient information to code with codable children: Secondary | ICD-10-CM | POA: Insufficient documentation

## 2016-01-19 LAB — GLUCOSE TOLERANCE, 1 HOUR (50G) W/O FASTING: Glucose, 1 Hr, gestational: 100 mg/dL (ref <140)

## 2016-01-19 LAB — RPR

## 2016-01-20 ENCOUNTER — Telehealth: Payer: Self-pay | Admitting: *Deleted

## 2016-01-20 NOTE — Telephone Encounter (Signed)
Called pt and left message that we need to discuss some results with her. We had sent a letter previously and are not sure if she received it. Please call back and state whether a detailed message can be left on her voice mail. Pt needs to be informed of abnormal Pap and need for Colposcopy which can be done @ her next scheduled visit on 8/21.

## 2016-01-21 ENCOUNTER — Encounter (HOSPITAL_COMMUNITY): Payer: Self-pay

## 2016-01-21 ENCOUNTER — Other Ambulatory Visit (HOSPITAL_COMMUNITY): Payer: Self-pay | Admitting: Maternal and Fetal Medicine

## 2016-01-21 ENCOUNTER — Ambulatory Visit (HOSPITAL_COMMUNITY)
Admission: RE | Admit: 2016-01-21 | Discharge: 2016-01-21 | Disposition: A | Discharge status: Home / Self Care | Payer: Medicaid Other | Source: Ambulatory Visit | Attending: Certified Nurse Midwife | Admitting: Certified Nurse Midwife

## 2016-01-21 DIAGNOSIS — O99212 Obesity complicating pregnancy, second trimester: Secondary | ICD-10-CM

## 2016-01-21 DIAGNOSIS — E669 Obesity, unspecified: Secondary | ICD-10-CM | POA: Insufficient documentation

## 2016-01-21 DIAGNOSIS — A630 Anogenital (venereal) warts: Secondary | ICD-10-CM

## 2016-01-21 DIAGNOSIS — O403XX0 Polyhydramnios, third trimester, not applicable or unspecified: Secondary | ICD-10-CM | POA: Insufficient documentation

## 2016-01-21 DIAGNOSIS — O99842 Bariatric surgery status complicating pregnancy, second trimester: Secondary | ICD-10-CM | POA: Diagnosis not present

## 2016-01-21 DIAGNOSIS — O409XX0 Polyhydramnios, unspecified trimester, not applicable or unspecified: Secondary | ICD-10-CM

## 2016-01-21 DIAGNOSIS — Z3A28 28 weeks gestation of pregnancy: Secondary | ICD-10-CM

## 2016-01-21 DIAGNOSIS — Z9884 Bariatric surgery status: Secondary | ICD-10-CM

## 2016-01-21 DIAGNOSIS — O98319 Other infections with a predominantly sexual mode of transmission complicating pregnancy, unspecified trimester: Secondary | ICD-10-CM

## 2016-01-21 DIAGNOSIS — O1202 Gestational edema, second trimester: Secondary | ICD-10-CM

## 2016-01-21 DIAGNOSIS — Z3401 Encounter for supervision of normal first pregnancy, first trimester: Secondary | ICD-10-CM

## 2016-01-21 DIAGNOSIS — O402XX0 Polyhydramnios, second trimester, not applicable or unspecified: Secondary | ICD-10-CM | POA: Diagnosis present

## 2016-01-22 ENCOUNTER — Other Ambulatory Visit (HOSPITAL_COMMUNITY): Payer: Self-pay | Admitting: *Deleted

## 2016-01-22 DIAGNOSIS — O403XX0 Polyhydramnios, third trimester, not applicable or unspecified: Secondary | ICD-10-CM

## 2016-01-26 NOTE — Telephone Encounter (Signed)
Patient has appointment on 01/28/2016 will discuss results.

## 2016-01-28 ENCOUNTER — Other Ambulatory Visit (HOSPITAL_COMMUNITY): Payer: Self-pay | Admitting: Maternal and Fetal Medicine

## 2016-01-28 ENCOUNTER — Encounter (HOSPITAL_COMMUNITY): Payer: Self-pay

## 2016-01-28 ENCOUNTER — Ambulatory Visit (HOSPITAL_COMMUNITY)
Admission: RE | Admit: 2016-01-28 | Discharge: 2016-01-28 | Disposition: A | Discharge status: Home / Self Care | Payer: Medicaid Other | Source: Ambulatory Visit | Attending: Certified Nurse Midwife | Admitting: Certified Nurse Midwife

## 2016-01-28 DIAGNOSIS — O98319 Other infections with a predominantly sexual mode of transmission complicating pregnancy, unspecified trimester: Secondary | ICD-10-CM

## 2016-01-28 DIAGNOSIS — Z3A29 29 weeks gestation of pregnancy: Secondary | ICD-10-CM

## 2016-01-28 DIAGNOSIS — O99212 Obesity complicating pregnancy, second trimester: Secondary | ICD-10-CM

## 2016-01-28 DIAGNOSIS — O1202 Gestational edema, second trimester: Secondary | ICD-10-CM

## 2016-01-28 DIAGNOSIS — E669 Obesity, unspecified: Secondary | ICD-10-CM | POA: Insufficient documentation

## 2016-01-28 DIAGNOSIS — A630 Anogenital (venereal) warts: Secondary | ICD-10-CM

## 2016-01-28 DIAGNOSIS — O403XX0 Polyhydramnios, third trimester, not applicable or unspecified: Secondary | ICD-10-CM

## 2016-01-28 DIAGNOSIS — Z9884 Bariatric surgery status: Secondary | ICD-10-CM

## 2016-01-28 DIAGNOSIS — Z3401 Encounter for supervision of normal first pregnancy, first trimester: Secondary | ICD-10-CM

## 2016-01-29 NOTE — Progress Notes (Signed)
Pt called requesting results of 1 hr glucola.  I informed her results are normal.  Pt stated understanding with no further questions.

## 2016-02-04 ENCOUNTER — Other Ambulatory Visit (HOSPITAL_COMMUNITY): Payer: Self-pay | Admitting: Maternal and Fetal Medicine

## 2016-02-04 ENCOUNTER — Ambulatory Visit (HOSPITAL_COMMUNITY)
Admission: RE | Admit: 2016-02-04 | Discharge: 2016-02-04 | Disposition: A | Discharge status: Home / Self Care | Payer: Medicaid Other | Source: Ambulatory Visit | Attending: Certified Nurse Midwife | Admitting: Certified Nurse Midwife

## 2016-02-04 ENCOUNTER — Encounter (HOSPITAL_COMMUNITY): Payer: Self-pay

## 2016-02-04 DIAGNOSIS — Z3A30 30 weeks gestation of pregnancy: Secondary | ICD-10-CM

## 2016-02-04 DIAGNOSIS — O403XX0 Polyhydramnios, third trimester, not applicable or unspecified: Secondary | ICD-10-CM

## 2016-02-04 DIAGNOSIS — O99213 Obesity complicating pregnancy, third trimester: Secondary | ICD-10-CM | POA: Diagnosis not present

## 2016-02-04 DIAGNOSIS — E669 Obesity, unspecified: Secondary | ICD-10-CM | POA: Insufficient documentation

## 2016-02-08 ENCOUNTER — Ambulatory Visit (INDEPENDENT_AMBULATORY_CARE_PROVIDER_SITE_OTHER): Payer: Medicaid Other | Admitting: Family Medicine

## 2016-02-08 VITALS — BP 128/85 | HR 85 | Wt 272.8 lb

## 2016-02-08 DIAGNOSIS — R896 Abnormal cytological findings in specimens from other organs, systems and tissues: Secondary | ICD-10-CM

## 2016-02-08 DIAGNOSIS — O26893 Other specified pregnancy related conditions, third trimester: Secondary | ICD-10-CM

## 2016-02-08 DIAGNOSIS — O409XX0 Polyhydramnios, unspecified trimester, not applicable or unspecified: Secondary | ICD-10-CM | POA: Insufficient documentation

## 2016-02-08 DIAGNOSIS — N898 Other specified noninflammatory disorders of vagina: Secondary | ICD-10-CM

## 2016-02-08 DIAGNOSIS — IMO0002 Reserved for concepts with insufficient information to code with codable children: Secondary | ICD-10-CM

## 2016-02-08 DIAGNOSIS — O403XX0 Polyhydramnios, third trimester, not applicable or unspecified: Secondary | ICD-10-CM

## 2016-02-08 DIAGNOSIS — R319 Hematuria, unspecified: Secondary | ICD-10-CM

## 2016-02-08 DIAGNOSIS — Z3401 Encounter for supervision of normal first pregnancy, first trimester: Secondary | ICD-10-CM

## 2016-02-08 LAB — POCT URINALYSIS DIP (DEVICE)
GLUCOSE, UA: 100 mg/dL — AB
KETONES UR: 15 mg/dL — AB
Nitrite: NEGATIVE
Protein, ur: 100 mg/dL — AB
SPECIFIC GRAVITY, URINE: 1.025 (ref 1.005–1.030)
Urobilinogen, UA: >=8 mg/dL (ref 0.0–1.0)
pH: 6 (ref 5.0–8.0)

## 2016-02-08 NOTE — Addendum Note (Signed)
Addended by: Gerome ApleyZEYFANG, Yi Falletta L on: 02/08/2016 04:22 PM   Modules accepted: Orders

## 2016-02-08 NOTE — Progress Notes (Signed)
Subjective:  Carrie Cardenas is a 25 y.o. G1P0000 at 5032w6d being seen today for ongoing prenatal care.  She is currently monitored for the following issues for this high-risk pregnancy and has Supervision of normal first pregnancy in first trimester; History of gastric restrictive surgery; BV (bacterial vaginosis); Yeast infection of the vagina; Genital warts complicating pregnancy; Leg swelling in pregnancy in second trimester; LGSIL (low grade squamous intraepithelial dysplasia); and Polyhydramnios on her problem list.  Patient reports no complaints.  Contractions: Not present. Vag. Bleeding: Scant.  Movement: Present. Denies leaking of fluid.   The following portions of the patient's history were reviewed and updated as appropriate: allergies, current medications, past family history, past medical history, past social history, past surgical history and problem list. Problem list updated.  Objective:   Vitals:   02/08/16 1514  BP: 128/85  Pulse: 85  Weight: 272 lb 12.8 oz (123.7 kg)    Fetal Status: Fetal Heart Rate (bpm): 130   Movement: Present     General:  Alert, oriented and cooperative. Patient is in no acute distress.  Skin: Skin is warm and dry. No rash noted.   Cardiovascular: Normal heart rate noted  Respiratory: Normal respiratory effort, no problems with respiration noted  Abdomen: Soft, gravid, appropriate for gestational age. Pain/Pressure: Absent     Pelvic:  Cervical exam deferred        Extremities: Normal range of motion.  Edema: Moderate pitting, indentation subsides rapidly  Mental Status: Normal mood and affect. Normal behavior. Normal judgment and thought content.   Urinalysis:      Assessment and Plan:  Pregnancy: G1P0000 at 5532w6d  1. Supervision of normal first pregnancy in first trimester Pinkish discharge x 1 month.  Wet prep done.  2. Polyhydramnios, third trimester, not applicable or unspecified fetus Repeat US on Thursday.  RTC 1 week to start NST  twice weekly.  3. LGSIL (low grade squamous intraepithelial dysplasia) Will do colpo following delivery.  Preterm labor symptoms and general obstetric precautions including but not limited to vaginal bleeding, contractions, leaking of fluid and fetal movement were reviewed in detail with the patient. Please refer to After Visit Summary for other counseling recommendations.  No Follow-up on file.   Levie HeritageJacob J Deric Bocock, DO

## 2016-02-08 NOTE — Addendum Note (Signed)
Addended by: Gerome ApleyZEYFANG, LINDA L on: 02/08/2016 04:27 PM   Modules accepted: Orders

## 2016-02-08 NOTE — Progress Notes (Signed)
Moderate hemoglobin noted on urinalysis.

## 2016-02-08 NOTE — Progress Notes (Signed)
C/o pinkish everytime she goes to the bathroom .

## 2016-02-09 LAB — WET PREP, GENITAL
TRICH WET PREP: NONE SEEN
YEAST WET PREP: NONE SEEN

## 2016-02-10 ENCOUNTER — Other Ambulatory Visit: Payer: Self-pay | Admitting: Family Medicine

## 2016-02-10 LAB — CULTURE, OB URINE
Colony Count: >=100000
Organism ID, Bacteria: 10000

## 2016-02-10 MED ORDER — METRONIDAZOLE 500 MG PO TABS: 500.0000 mg | ORAL_TABLET | Freq: Two times a day (BID) | ORAL | 0 refills | Status: DC

## 2016-02-10 NOTE — Progress Notes (Signed)
BV on wet prep.  Please notify patient - script sent to pharmacy.

## 2016-02-11 ENCOUNTER — Ambulatory Visit (HOSPITAL_COMMUNITY)
Admission: RE | Admit: 2016-02-11 | Discharge: 2016-02-11 | Disposition: A | Discharge status: Home / Self Care | Payer: Medicaid Other | Source: Ambulatory Visit | Attending: Certified Nurse Midwife | Admitting: Certified Nurse Midwife

## 2016-02-11 ENCOUNTER — Other Ambulatory Visit (HOSPITAL_COMMUNITY): Payer: Self-pay | Admitting: Maternal and Fetal Medicine

## 2016-02-11 ENCOUNTER — Encounter (HOSPITAL_COMMUNITY): Payer: Self-pay

## 2016-02-11 DIAGNOSIS — O99213 Obesity complicating pregnancy, third trimester: Secondary | ICD-10-CM | POA: Insufficient documentation

## 2016-02-11 DIAGNOSIS — A630 Anogenital (venereal) warts: Secondary | ICD-10-CM

## 2016-02-11 DIAGNOSIS — O403XX0 Polyhydramnios, third trimester, not applicable or unspecified: Secondary | ICD-10-CM | POA: Insufficient documentation

## 2016-02-11 DIAGNOSIS — Z9884 Bariatric surgery status: Secondary | ICD-10-CM

## 2016-02-11 DIAGNOSIS — E669 Obesity, unspecified: Secondary | ICD-10-CM | POA: Insufficient documentation

## 2016-02-11 DIAGNOSIS — O98319 Other infections with a predominantly sexual mode of transmission complicating pregnancy, unspecified trimester: Secondary | ICD-10-CM

## 2016-02-11 DIAGNOSIS — Z3401 Encounter for supervision of normal first pregnancy, first trimester: Secondary | ICD-10-CM

## 2016-02-11 DIAGNOSIS — Z3A31 31 weeks gestation of pregnancy: Secondary | ICD-10-CM | POA: Insufficient documentation

## 2016-02-11 DIAGNOSIS — O1202 Gestational edema, second trimester: Secondary | ICD-10-CM

## 2016-02-14 ENCOUNTER — Inpatient Hospital Stay (HOSPITAL_COMMUNITY)
Admission: AD | Admit: 2016-02-14 | Discharge: 2016-02-15 | Disposition: A | Discharge status: Home / Self Care | Payer: Medicaid Other | Source: Ambulatory Visit | Attending: Obstetrics and Gynecology | Admitting: Obstetrics and Gynecology

## 2016-02-14 DIAGNOSIS — F329 Major depressive disorder, single episode, unspecified: Secondary | ICD-10-CM | POA: Insufficient documentation

## 2016-02-14 DIAGNOSIS — O99343 Other mental disorders complicating pregnancy, third trimester: Secondary | ICD-10-CM | POA: Insufficient documentation

## 2016-02-14 DIAGNOSIS — O1203 Gestational edema, third trimester: Secondary | ICD-10-CM

## 2016-02-14 DIAGNOSIS — O2693 Pregnancy related conditions, unspecified, third trimester: Secondary | ICD-10-CM | POA: Insufficient documentation

## 2016-02-14 DIAGNOSIS — O1202 Gestational edema, second trimester: Secondary | ICD-10-CM

## 2016-02-14 DIAGNOSIS — M7989 Other specified soft tissue disorders: Secondary | ICD-10-CM | POA: Insufficient documentation

## 2016-02-14 DIAGNOSIS — Z3401 Encounter for supervision of normal first pregnancy, first trimester: Secondary | ICD-10-CM

## 2016-02-14 DIAGNOSIS — Z3A31 31 weeks gestation of pregnancy: Secondary | ICD-10-CM | POA: Insufficient documentation

## 2016-02-14 DIAGNOSIS — Z9884 Bariatric surgery status: Secondary | ICD-10-CM

## 2016-02-14 NOTE — MAU Note (Signed)
Pt reports swelling of bilateral lower extremities for "a while" but it is getting worse. Also reports swelling of her right lower abd.

## 2016-02-15 ENCOUNTER — Encounter (HOSPITAL_COMMUNITY): Payer: Self-pay | Admitting: *Deleted

## 2016-02-15 DIAGNOSIS — F329 Major depressive disorder, single episode, unspecified: Secondary | ICD-10-CM | POA: Diagnosis not present

## 2016-02-15 DIAGNOSIS — Z3A31 31 weeks gestation of pregnancy: Secondary | ICD-10-CM | POA: Diagnosis not present

## 2016-02-15 DIAGNOSIS — O99343 Other mental disorders complicating pregnancy, third trimester: Secondary | ICD-10-CM | POA: Diagnosis not present

## 2016-02-15 DIAGNOSIS — M7989 Other specified soft tissue disorders: Secondary | ICD-10-CM | POA: Diagnosis present

## 2016-02-15 DIAGNOSIS — O1203 Gestational edema, third trimester: Secondary | ICD-10-CM

## 2016-02-15 DIAGNOSIS — Z9884 Bariatric surgery status: Secondary | ICD-10-CM | POA: Diagnosis not present

## 2016-02-15 DIAGNOSIS — O2693 Pregnancy related conditions, unspecified, third trimester: Secondary | ICD-10-CM | POA: Diagnosis not present

## 2016-02-15 NOTE — MAU Provider Note (Signed)
History    Carrie Cardenas is a 25yo, BF, G1 @31 .6 wks presents with c/o swelling in bilateral lower extremities, worse on left foot, and swelling in lower abdomen. Denies any pain.   History sign for gastric sleeve in 2015 and depression.   Pt states she drinks Gatorade only at home, no water, with very minimal intake today.  CSN: [id number]  Arrival date & time 02/14/16  2336   None     No chief complaint on file.   HPI  Past Medical History:  Diagnosis Date  . Depression    pt hospitalized at 25 years old for major depression    Past Surgical History:  Procedure Laterality Date  . GASTRIC BYPASS    . SLEEVE GASTROPLASTY  2015    Family History  Problem Relation Age of Onset  . Fibroids Mother   . Anemia Mother   . Pulmonary embolism Mother   . Cancer Maternal Grandmother     Social History  Substance Use Topics  . Smoking status: Never Smoker  . Smokeless tobacco: Never Used  . Alcohol use No    OB History    Gravida Para Term Preterm AB Living   1 0 0 0 0 0   SAB TAB Ectopic Multiple Live Births   0 0 0 0        Review of Systems  Constitutional: Negative.   HENT: Negative.   Eyes: Negative.   Respiratory: Negative.   Cardiovascular: Positive for leg swelling.  Gastrointestinal:       Abd swelling on right side   Endocrine: Negative.   Genitourinary: Negative.   Musculoskeletal: Negative.   Skin: Negative.   Allergic/Immunologic: Negative.   Neurological: Negative.   Hematological: Negative.   Psychiatric/Behavioral: Negative.     Allergies  Review of patient's allergies indicates no known allergies.  Home Medications    BP 136/86 (BP Location: Right Arm)   Pulse 75   Temp 98.5 F (36.9 C) (Oral)   Resp 20   Ht 5\' 6"  (1.676 m)   Wt 279 lb (126.6 kg)   LMP 07/07/2015 (Exact Date)   SpO2 100%   BMI 45.03 kg/m   Physical Exam  Constitutional: She is oriented to person, place, and time. She appears well-developed and well-nourished.   HENT:  Head: Normocephalic.  Eyes: Pupils are equal, round, and reactive to light.  Neck: Normal range of motion.  Cardiovascular: Normal rate, regular rhythm and normal heart sounds.   Pulmonary/Chest: Effort normal and breath sounds normal.  Abdominal: Soft. Bowel sounds are normal.  Musculoskeletal: She exhibits edema.  Neurological: She is alert and oriented to person, place, and time.  Skin: Skin is warm and dry.  Psychiatric: She has a normal mood and affect. Her behavior is normal. Judgment and thought content normal.    MAU Course  Procedures (including critical care time)  Labs Reviewed - No data to display No results found.   1. Leg swelling in pregnancy in second trimester   2. Supervision of normal first pregnancy in first trimester   3. History of gastric restrictive surgery       MDM  Bilateral lower extremity edema, worse on left; swelling of lower abd (peau' d orange);  hx of gastric sleeve procedure  Discharge home in stable condition, F/u appt on Tuesday 8/29 at 8am; Discussed increased water intake, diet modifications, possible need for support stockings

## 2016-02-16 ENCOUNTER — Other Ambulatory Visit: Payer: Self-pay

## 2016-02-16 ENCOUNTER — Ambulatory Visit (INDEPENDENT_AMBULATORY_CARE_PROVIDER_SITE_OTHER): Payer: Medicaid Other | Admitting: *Deleted

## 2016-02-16 DIAGNOSIS — O403XX0 Polyhydramnios, third trimester, not applicable or unspecified: Secondary | ICD-10-CM | POA: Diagnosis not present

## 2016-02-16 MED ORDER — METRONIDAZOLE 500 MG PO TABS: 500.0000 mg | ORAL_TABLET | Freq: Two times a day (BID) | ORAL | 0 refills | Status: DC

## 2016-02-16 NOTE — Progress Notes (Signed)
Patient was seen in our office today. Will address results

## 2016-02-18 ENCOUNTER — Other Ambulatory Visit (HOSPITAL_COMMUNITY): Payer: Self-pay

## 2016-02-18 ENCOUNTER — Encounter (HOSPITAL_COMMUNITY): Payer: Self-pay

## 2016-02-18 ENCOUNTER — Ambulatory Visit (HOSPITAL_COMMUNITY)
Admission: RE | Admit: 2016-02-18 | Discharge: 2016-02-18 | Disposition: A | Discharge status: Home / Self Care | Payer: Medicaid Other | Source: Ambulatory Visit | Attending: Certified Nurse Midwife | Admitting: Certified Nurse Midwife

## 2016-02-18 DIAGNOSIS — Z9884 Bariatric surgery status: Secondary | ICD-10-CM

## 2016-02-18 DIAGNOSIS — Z3401 Encounter for supervision of normal first pregnancy, first trimester: Secondary | ICD-10-CM

## 2016-02-18 DIAGNOSIS — Z3A32 32 weeks gestation of pregnancy: Secondary | ICD-10-CM | POA: Diagnosis not present

## 2016-02-18 DIAGNOSIS — O99213 Obesity complicating pregnancy, third trimester: Secondary | ICD-10-CM | POA: Diagnosis not present

## 2016-02-18 DIAGNOSIS — O98319 Other infections with a predominantly sexual mode of transmission complicating pregnancy, unspecified trimester: Secondary | ICD-10-CM

## 2016-02-18 DIAGNOSIS — A630 Anogenital (venereal) warts: Secondary | ICD-10-CM

## 2016-02-18 DIAGNOSIS — O1202 Gestational edema, second trimester: Secondary | ICD-10-CM

## 2016-02-18 DIAGNOSIS — O403XX0 Polyhydramnios, third trimester, not applicable or unspecified: Secondary | ICD-10-CM | POA: Diagnosis not present

## 2016-02-23 NOTE — Progress Notes (Signed)
NST Note Date: 02/14/2016 Gestational Age: 24/5 FHT: 135 baseline, +accels, no decel, mod var Toco: +irritability  A/P: rNST. Continue current plan of care.   Cornelia Copaharlie Ned Kakar, Jr MD Attending [facility](Faculty Practice)

## 2016-02-25 ENCOUNTER — Ambulatory Visit (HOSPITAL_COMMUNITY)
Admission: RE | Admit: 2016-02-25 | Discharge: 2016-02-25 | Disposition: A | Discharge status: Home / Self Care | Payer: Medicaid Other | Source: Ambulatory Visit | Attending: Certified Nurse Midwife | Admitting: Certified Nurse Midwife

## 2016-02-25 ENCOUNTER — Ambulatory Visit (INDEPENDENT_AMBULATORY_CARE_PROVIDER_SITE_OTHER): Payer: Medicaid Other | Admitting: Family Medicine

## 2016-02-25 ENCOUNTER — Encounter (HOSPITAL_COMMUNITY): Payer: Self-pay

## 2016-02-25 VITALS — BP 115/78 | HR 91 | Wt 280.8 lb

## 2016-02-25 DIAGNOSIS — Z3A33 33 weeks gestation of pregnancy: Secondary | ICD-10-CM | POA: Insufficient documentation

## 2016-02-25 DIAGNOSIS — Z9884 Bariatric surgery status: Secondary | ICD-10-CM

## 2016-02-25 DIAGNOSIS — O403XX0 Polyhydramnios, third trimester, not applicable or unspecified: Secondary | ICD-10-CM | POA: Insufficient documentation

## 2016-02-25 DIAGNOSIS — O99213 Obesity complicating pregnancy, third trimester: Secondary | ICD-10-CM | POA: Diagnosis not present

## 2016-02-25 DIAGNOSIS — A630 Anogenital (venereal) warts: Secondary | ICD-10-CM

## 2016-02-25 DIAGNOSIS — Z3401 Encounter for supervision of normal first pregnancy, first trimester: Secondary | ICD-10-CM

## 2016-02-25 DIAGNOSIS — O1202 Gestational edema, second trimester: Secondary | ICD-10-CM

## 2016-02-25 DIAGNOSIS — O98319 Other infections with a predominantly sexual mode of transmission complicating pregnancy, unspecified trimester: Secondary | ICD-10-CM

## 2016-02-25 NOTE — Progress Notes (Signed)
Pt is scheduled for weekly BPP per recommendation MFM.  US for growth done 9/1 - next growth US scheduled 9/28.  Pt still has not obtained Metronidazole to treat BV - states she will get it today.

## 2016-02-25 NOTE — Progress Notes (Signed)
   PRENATAL VISIT NOTE  Subjective:  Carrie Cardenas is a 25 y.o. G1P0000 at 8044w2d being seen today for ongoing prenatal care.  She is currently monitored for the following issues for this high-risk pregnancy and has Supervision of normal first pregnancy in first trimester; History of gastric restrictive surgery; BV (bacterial vaginosis); Yeast infection of the vagina; Genital warts complicating pregnancy; Leg swelling in pregnancy in second trimester; LGSIL (low grade squamous intraepithelial dysplasia); and Polyhydramnios on her problem list.  Patient reports no complaints.  Contractions: Irregular. Vag. Bleeding: None.  Movement: Present. Denies leaking of fluid.   The following portions of the patient's history were reviewed and updated as appropriate: allergies, current medications, past family history, past medical history, past social history, past surgical history and problem list. Problem list updated.  Objective:   Vitals:   02/25/16 1315  BP: 115/78  Pulse: 91  Weight: 280 lb 12.8 oz (127.4 kg)    Fetal Status: Fetal Heart Rate (bpm): NST   Movement: Present     General:  Alert, oriented and cooperative. Patient is in no acute distress.  Skin: Skin is warm and dry. No rash noted.   Cardiovascular: Normal heart rate noted  Respiratory: Normal respiratory effort, no problems with respiration noted  Abdomen: Soft, gravid, appropriate for gestational age. Pain/Pressure: Present     Pelvic:  Cervical exam deferred        Extremities: Normal range of motion.     Mental Status: Normal mood and affect. Normal behavior. Normal judgment and thought content.   Urinalysis:      Assessment and Plan:  Pregnancy: G1P0000 at 2744w2d  1. Supervision of normal first pregnancy in first trimester FHT normal  2. Polyhydramnios, third trimester, not applicable or unspecified fetus NST reactive.  BPP today. - Fetal nonstress test  Preterm labor symptoms and general obstetric precautions  including but not limited to vaginal bleeding, contractions, leaking of fluid and fetal movement were reviewed in detail with the patient. Please refer to After Visit Summary for other counseling recommendations.  Return in about 1 week (around 03/03/2016) for NST.  Levie HeritageJacob J Stinson, DO

## 2016-03-03 ENCOUNTER — Ambulatory Visit (INDEPENDENT_AMBULATORY_CARE_PROVIDER_SITE_OTHER): Payer: Medicaid Other | Admitting: *Deleted

## 2016-03-03 ENCOUNTER — Encounter (HOSPITAL_COMMUNITY): Payer: Self-pay

## 2016-03-03 ENCOUNTER — Other Ambulatory Visit (HOSPITAL_COMMUNITY): Payer: Self-pay | Admitting: Maternal and Fetal Medicine

## 2016-03-03 ENCOUNTER — Other Ambulatory Visit: Payer: Self-pay

## 2016-03-03 ENCOUNTER — Ambulatory Visit (HOSPITAL_COMMUNITY)
Admission: RE | Admit: 2016-03-03 | Discharge: 2016-03-03 | Disposition: A | Discharge status: Home / Self Care | Payer: Medicaid Other | Source: Ambulatory Visit | Attending: Certified Nurse Midwife | Admitting: Certified Nurse Midwife

## 2016-03-03 DIAGNOSIS — O99213 Obesity complicating pregnancy, third trimester: Secondary | ICD-10-CM | POA: Diagnosis not present

## 2016-03-03 DIAGNOSIS — Z3A34 34 weeks gestation of pregnancy: Secondary | ICD-10-CM

## 2016-03-03 DIAGNOSIS — E669 Obesity, unspecified: Secondary | ICD-10-CM | POA: Diagnosis not present

## 2016-03-03 DIAGNOSIS — O403XX0 Polyhydramnios, third trimester, not applicable or unspecified: Secondary | ICD-10-CM | POA: Insufficient documentation

## 2016-03-03 NOTE — Progress Notes (Signed)
NST performed today was reviewed and was found to be reactive.  Continue recommended antenatal testing and prenatal care.  

## 2016-03-10 ENCOUNTER — Inpatient Hospital Stay (HOSPITAL_COMMUNITY)
Admission: AD | Admit: 2016-03-10 | Discharge: 2016-03-10 | Disposition: A | Discharge status: Home / Self Care | Payer: Medicaid Other | Source: Ambulatory Visit | Attending: Obstetrics and Gynecology | Admitting: Obstetrics and Gynecology

## 2016-03-10 ENCOUNTER — Ambulatory Visit (INDEPENDENT_AMBULATORY_CARE_PROVIDER_SITE_OTHER): Payer: Medicaid Other | Admitting: Obstetrics and Gynecology

## 2016-03-10 ENCOUNTER — Encounter (HOSPITAL_COMMUNITY): Payer: Self-pay | Admitting: *Deleted

## 2016-03-10 ENCOUNTER — Inpatient Hospital Stay (HOSPITAL_COMMUNITY): Payer: Medicaid Other

## 2016-03-10 ENCOUNTER — Other Ambulatory Visit: Payer: Self-pay | Admitting: Obstetrics and Gynecology

## 2016-03-10 ENCOUNTER — Ambulatory Visit (HOSPITAL_COMMUNITY): Admission: RE | Admit: 2016-03-10 | Payer: Medicaid Other | Source: Ambulatory Visit

## 2016-03-10 VITALS — BP 142/81 | HR 71 | Wt 271.0 lb

## 2016-03-10 DIAGNOSIS — IMO0002 Reserved for concepts with insufficient information to code with codable children: Secondary | ICD-10-CM

## 2016-03-10 DIAGNOSIS — Z3A35 35 weeks gestation of pregnancy: Secondary | ICD-10-CM | POA: Diagnosis not present

## 2016-03-10 DIAGNOSIS — O1202 Gestational edema, second trimester: Secondary | ICD-10-CM

## 2016-03-10 DIAGNOSIS — Z9884 Bariatric surgery status: Secondary | ICD-10-CM

## 2016-03-10 DIAGNOSIS — O99843 Bariatric surgery status complicating pregnancy, third trimester: Secondary | ICD-10-CM | POA: Diagnosis not present

## 2016-03-10 DIAGNOSIS — Z23 Encounter for immunization: Secondary | ICD-10-CM | POA: Diagnosis not present

## 2016-03-10 DIAGNOSIS — O403XX0 Polyhydramnios, third trimester, not applicable or unspecified: Secondary | ICD-10-CM

## 2016-03-10 DIAGNOSIS — Z3493 Encounter for supervision of normal pregnancy, unspecified, third trimester: Secondary | ICD-10-CM

## 2016-03-10 DIAGNOSIS — O26893 Other specified pregnancy related conditions, third trimester: Secondary | ICD-10-CM | POA: Insufficient documentation

## 2016-03-10 DIAGNOSIS — O1203 Gestational edema, third trimester: Secondary | ICD-10-CM | POA: Diagnosis not present

## 2016-03-10 DIAGNOSIS — O409XX0 Polyhydramnios, unspecified trimester, not applicable or unspecified: Secondary | ICD-10-CM

## 2016-03-10 DIAGNOSIS — A630 Anogenital (venereal) warts: Secondary | ICD-10-CM

## 2016-03-10 DIAGNOSIS — R030 Elevated blood-pressure reading, without diagnosis of hypertension: Secondary | ICD-10-CM | POA: Insufficient documentation

## 2016-03-10 DIAGNOSIS — O98319 Other infections with a predominantly sexual mode of transmission complicating pregnancy, unspecified trimester: Secondary | ICD-10-CM

## 2016-03-10 LAB — URINALYSIS, ROUTINE W REFLEX MICROSCOPIC
Bilirubin Urine: NEGATIVE
Glucose, UA: NEGATIVE mg/dL
HGB URINE DIPSTICK: NEGATIVE
Ketones, ur: NEGATIVE mg/dL
NITRITE: NEGATIVE
PROTEIN: NEGATIVE mg/dL
SPECIFIC GRAVITY, URINE: 1.015 (ref 1.005–1.030)
pH: 6 (ref 5.0–8.0)

## 2016-03-10 LAB — CBC
HEMATOCRIT: 27.4 % — AB (ref 36.0–46.0)
Hemoglobin: 9.4 g/dL — ABNORMAL LOW (ref 12.0–15.0)
MCH: 28.9 pg (ref 26.0–34.0)
MCHC: 34.3 g/dL (ref 30.0–36.0)
MCV: 84.3 fL (ref 78.0–100.0)
Platelets: 152 10*3/uL (ref 150–400)
RBC: 3.25 MIL/uL — AB (ref 3.87–5.11)
RDW: 14.9 % (ref 11.5–15.5)
WBC: 4.6 10*3/uL (ref 4.0–10.5)

## 2016-03-10 LAB — COMPREHENSIVE METABOLIC PANEL
ALBUMIN: 3 g/dL — AB (ref 3.5–5.0)
ALT: 61 U/L — AB (ref 14–54)
AST: 38 U/L (ref 15–41)
Alkaline Phosphatase: 111 U/L (ref 38–126)
Anion gap: 5 (ref 5–15)
BILIRUBIN TOTAL: 0.6 mg/dL (ref 0.3–1.2)
BUN: 6 mg/dL (ref 6–20)
CHLORIDE: 108 mmol/L (ref 101–111)
CO2: 23 mmol/L (ref 22–32)
CREATININE: 0.49 mg/dL (ref 0.44–1.00)
Calcium: 8.7 mg/dL — ABNORMAL LOW (ref 8.9–10.3)
GFR calc Af Amer: >60 mL/min (ref >60)
GFR calc non Af Amer: >60 mL/min (ref >60)
GLUCOSE: 78 mg/dL (ref 65–99)
POTASSIUM: 3.5 mmol/L (ref 3.5–5.1)
Sodium: 136 mmol/L (ref 135–145)
TOTAL PROTEIN: 6.3 g/dL — AB (ref 6.5–8.1)

## 2016-03-10 LAB — URINE MICROSCOPIC-ADD ON

## 2016-03-10 LAB — PROTEIN / CREATININE RATIO, URINE
CREATININE, URINE: 153 mg/dL
PROTEIN CREATININE RATIO: 0.06 mg/mg{creat} (ref 0.00–0.15)
TOTAL PROTEIN, URINE: 9 mg/dL

## 2016-03-10 MED ORDER — TETANUS-DIPHTH-ACELL PERTUSSIS 5-2.5-18.5 LF-MCG/0.5 IM SUSP
0.5000 mL | Freq: Once | INTRAMUSCULAR | Status: AC
  Administered 2016-03-10: 0.5 mL via INTRAMUSCULAR

## 2016-03-10 NOTE — MAU Provider Note (Signed)
  History     CSN: [id number]  Arrival date and time: 03/10/16 1339   None     Chief Complaint  Patient presents with  . Hypertension   HPI: Ms Franco ColletBartley is a 25 yo G1P0 at 1135 2/7 weeks who was sent to MAU for r/o PEC. She denies Ha, visual changes or abd pain. Reports + fetal movement.    Past Medical History:  Diagnosis Date  . Depression    pt hospitalized at 25 years old for major depression    Past Surgical History:  Procedure Laterality Date  . GASTRIC BYPASS    . SLEEVE GASTROPLASTY  2015    Family History  Problem Relation Age of Onset  . Fibroids Mother   . Anemia Mother   . Pulmonary embolism Mother   . Cancer Maternal Grandmother     Social History  Substance Use Topics  . Smoking status: Never Smoker  . Smokeless tobacco: Never Used  . Alcohol use No    Allergies: No Known Allergies  Prescriptions Prior to Admission  Medication Sig Dispense Refill Last Dose  . acetaminophen (TYLENOL) 500 MG tablet Take 1,000 mg by mouth every 6 (six) hours as needed for mild pain or headache.   Past Week at Unknown time  . ferrous sulfate 325 (65 FE) MG tablet Take 1 tablet (325 mg total) by mouth daily. 30 tablet 3 03/10/2016 at Unknown time  . Prenatal Vit-Fe Fumarate-FA (PRENATAL MULTIVITAMIN) TABS tablet Take 1 tablet by mouth daily at 12 noon.   03/10/2016 at Unknown time    ROS Physical Exam   Blood pressure 112/82, pulse 95, temperature 97.5 F (36.4 C), temperature source Oral, resp. rate 18, last menstrual period 01/06/2016.  Physical Exam  Constitutional: She appears well-developed and well-nourished.  Cardiovascular: Normal rate and regular rhythm.   Respiratory: Effort normal and breath sounds normal.  Musculoskeletal:  1 + edema, nl DTR's, no clonus    MAU Course  Procedures Pt had serial BP while in MAU 110-130's 70-80's. One BP dys 99. Labs including P/Crt ratio normal. BPP 8/8   Assessment and Plan  IUP 35 2/7 weeks  No evidence of PEC  at present. Pt to decrease activity, increase po fluids and keep routine OB visit. S/Sx of PEC reviewed with pt and she was instructed to call for these.  Hermina StaggersMichael L Taetum Flewellen 03/10/2016, 4:30 PM

## 2016-03-10 NOTE — Discharge Instructions (Signed)

## 2016-03-10 NOTE — MAU Note (Signed)
Pt sent up from clinic for elevated b/p's. Pt denies any headache or visual changes.  Swelling noted in her feet.

## 2016-03-10 NOTE — Progress Notes (Signed)
Prenatal Visit Note Date: 03/10/2016 Clinic: [facility]  Subjective:  Carrie Cardenas is a 25 y.o. G1P0000 at 7049w2d being seen today for ongoing prenatal care.  She is currently monitored for the following issues for this low-risk pregnancy and has Supervision of normal pregnancy in third trimester; History of gastric restrictive surgery; BV (bacterial vaginosis); Yeast infection of the vagina; Genital warts complicating pregnancy; Leg swelling in pregnancy in second trimester; LGSIL (low grade squamous intraepithelial dysplasia); and Polyhydramnios on her problem list.  Patient reports no complaints.   Contractions: Not present. Vag. Bleeding: None.  Movement: Present. Denies leaking of fluid.   The following portions of the patient's history were reviewed and updated as appropriate: allergies, current medications, past family history, past medical history, past social history, past surgical history and problem list. Problem list updated.  Objective:   Vitals:   03/10/16 1249 03/10/16 1308  BP: (!) 145/84 (!) 142/81  Pulse: 71   Weight: 271 lb (122.9 kg)     Fetal Status: Fetal Heart Rate (bpm): NST   Movement: Present     General:  Alert, oriented and cooperative. Patient is in no acute distress.  Skin: Skin is warm and dry. No rash noted.   Cardiovascular: Normal heart rate noted  Respiratory: Normal respiratory effort, no problems with respiration noted  Abdomen: Soft, gravid, appropriate for gestational age. Pain/Pressure: Present     Pelvic:  Cervical exam deferred        Extremities: Normal range of motion.  Edema: Moderate pitting, indentation subsides rapidly  Mental Status: Normal mood and affect. Normal behavior. Normal judgment and thought content.  Normal s1 and s2, no MRGs CTAB  Urinalysis:      Assessment and Plan:  Pregnancy: G1P0000 at 149w2d  1. Polyhydramnios, third trimester, not applicable or unspecified fetus Has qwk BPP already  scheduled for today and for next few weeks. Growth scan is next week. F/u mfm recs re: delivery timing rNST today. Can d/c NSTs since getting BPPs with MFM - Flu Vaccine QUAD 36+ mos IM (Fluarix, Quad PF) - Tdap (BOOSTRIX) injection 0.5 mL; Inject 0.5 mLs into the muscle once.  2. Supervision of normal pregnancy in third trimester Routine care  3. History of gastric restrictive surgery Continue with regular growth scans  4. ?HTN Didn't eat lunch today and HA and visual s/s are negative per my d/w pt. Will send to MAU for labs, serial BPs and to go to MFM for BPP at 1415 today from there. Pre-cautions given.   Preterm labor symptoms and general obstetric precautions including but not limited to vaginal bleeding, contractions, leaking of fluid and fetal movement were reviewed in detail with the patient. Please refer to After Visit Summary for other counseling recommendations.  Return in about 7 days (around 03/17/2016) for as scheduled.   Hastings Bingharlie Mikael Skoda, MD

## 2016-03-10 NOTE — Progress Notes (Signed)
Patient reports headaches & seeing spots x 1 week. Tylenol does help.

## 2016-03-17 ENCOUNTER — Other Ambulatory Visit (HOSPITAL_COMMUNITY)
Admission: RE | Admit: 2016-03-17 | Discharge: 2016-03-17 | Disposition: A | Discharge status: Home / Self Care | Payer: Medicaid Other | Source: Ambulatory Visit | Attending: Family Medicine | Admitting: Family Medicine

## 2016-03-17 ENCOUNTER — Ambulatory Visit (INDEPENDENT_AMBULATORY_CARE_PROVIDER_SITE_OTHER): Payer: Medicaid Other | Admitting: Family Medicine

## 2016-03-17 ENCOUNTER — Encounter (HOSPITAL_COMMUNITY): Payer: Self-pay

## 2016-03-17 ENCOUNTER — Ambulatory Visit (HOSPITAL_COMMUNITY)
Admission: RE | Admit: 2016-03-17 | Discharge: 2016-03-17 | Disposition: A | Discharge status: Home / Self Care | Payer: Medicaid Other | Source: Ambulatory Visit | Attending: Certified Nurse Midwife | Admitting: Certified Nurse Midwife

## 2016-03-17 ENCOUNTER — Ambulatory Visit (HOSPITAL_COMMUNITY): Payer: Medicaid Other

## 2016-03-17 VITALS — BP 133/89 | HR 87 | Wt 263.2 lb

## 2016-03-17 DIAGNOSIS — O1202 Gestational edema, second trimester: Secondary | ICD-10-CM

## 2016-03-17 DIAGNOSIS — Z3493 Encounter for supervision of normal pregnancy, unspecified, third trimester: Secondary | ICD-10-CM

## 2016-03-17 DIAGNOSIS — O403XX0 Polyhydramnios, third trimester, not applicable or unspecified: Secondary | ICD-10-CM | POA: Diagnosis not present

## 2016-03-17 DIAGNOSIS — Z9884 Bariatric surgery status: Secondary | ICD-10-CM

## 2016-03-17 DIAGNOSIS — Z3A36 36 weeks gestation of pregnancy: Secondary | ICD-10-CM | POA: Insufficient documentation

## 2016-03-17 DIAGNOSIS — O99213 Obesity complicating pregnancy, third trimester: Secondary | ICD-10-CM | POA: Diagnosis present

## 2016-03-17 DIAGNOSIS — Z113 Encounter for screening for infections with a predominantly sexual mode of transmission: Secondary | ICD-10-CM | POA: Insufficient documentation

## 2016-03-17 DIAGNOSIS — Z3483 Encounter for supervision of other normal pregnancy, third trimester: Secondary | ICD-10-CM

## 2016-03-17 DIAGNOSIS — O98319 Other infections with a predominantly sexual mode of transmission complicating pregnancy, unspecified trimester: Secondary | ICD-10-CM

## 2016-03-17 DIAGNOSIS — A630 Anogenital (venereal) warts: Secondary | ICD-10-CM

## 2016-03-17 LAB — OB RESULTS CONSOLE GBS: GBS: POSITIVE

## 2016-03-17 LAB — OB RESULTS CONSOLE GC/CHLAMYDIA: Gonorrhea: NEGATIVE

## 2016-03-17 NOTE — Progress Notes (Signed)
   PRENATAL VISIT NOTE  Subjective:  Carrie Cardenas is a 25 y.o. G1P0000 at 567w4d being seen today for ongoing prenatal care.  She is currently monitored for the following issues for this high-risk pregnancy and has Supervision of normal pregnancy in third trimester; History of gastric restrictive surgery; BV (bacterial vaginosis); Yeast infection of the vagina; Genital warts complicating pregnancy; Leg swelling in pregnancy in second trimester; LGSIL (low grade squamous intraepithelial dysplasia); and Polyhydramnios on her problem list.  Patient reports no complaints.  Contractions: Not present. Vag. Bleeding: None.  Movement: Present. Denies leaking of fluid.   The following portions of the patient's history were reviewed and updated as appropriate: allergies, current medications, past family history, past medical history, past social history, past surgical history and problem list. Problem list updated.  Objective:   Vitals:   03/17/16 1333  BP: 133/89  Pulse: 87  Weight: 263 lb 3.2 oz (119.4 kg)    Fetal Status: Fetal Heart Rate (bpm): 124   Movement: Present  Presentation: Vertex  General:  Alert, oriented and cooperative. Patient is in no acute distress.  Skin: Skin is warm and dry. No rash noted.   Cardiovascular: Normal heart rate noted  Respiratory: Normal respiratory effort, no problems with respiration noted  Abdomen: Soft, gravid, appropriate for gestational age. Pain/Pressure: Absent     Pelvic:  Cervical exam performed Dilation: Fingertip Effacement (%): Thick Station: Ballotable  Extremities: Normal range of motion.  Edema: Mild pitting, slight indentation  Mental Status: Normal mood and affect. Normal behavior. Normal judgment and thought content.   Urinalysis:      Assessment and Plan:  Pregnancy: G1P0000 at 8067w4d  1. Supervision of normal pregnancy in third trimester FHT normal - GC/Chlamydia [facility] amp [facility] - Culture, beta strep (group b  only)  2. Polyhydramnios, third trimester, not applicable or unspecified fetus Continue NST to complete antenatal testing - will need one today.Marland Kitchen. Has BPP today as well  Preterm labor symptoms and general obstetric precautions including but not limited to vaginal bleeding, contractions, leaking of fluid and fetal movement were reviewed in detail with the patient. Please refer to After Visit Summary for other counseling recommendations.  No Follow-up on file.  Levie HeritageJacob J Stinson, DO

## 2016-03-18 LAB — GC/CHLAMYDIA PROBE AMP (~~LOC~~) NOT AT ARMC
CHLAMYDIA, DNA PROBE: NEGATIVE
Neisseria Gonorrhea: NEGATIVE

## 2016-03-18 LAB — CULTURE, BETA STREP (GROUP B ONLY)

## 2016-03-24 ENCOUNTER — Ambulatory Visit (INDEPENDENT_AMBULATORY_CARE_PROVIDER_SITE_OTHER): Payer: Medicaid Other | Admitting: Obstetrics and Gynecology

## 2016-03-24 ENCOUNTER — Encounter (HOSPITAL_COMMUNITY): Payer: Self-pay

## 2016-03-24 ENCOUNTER — Ambulatory Visit (HOSPITAL_COMMUNITY)
Admission: RE | Admit: 2016-03-24 | Discharge: 2016-03-24 | Disposition: A | Discharge status: Home / Self Care | Payer: Medicaid Other | Source: Ambulatory Visit | Attending: Certified Nurse Midwife | Admitting: Certified Nurse Midwife

## 2016-03-24 VITALS — BP 136/84 | HR 93 | Wt 257.4 lb

## 2016-03-24 DIAGNOSIS — O99213 Obesity complicating pregnancy, third trimester: Secondary | ICD-10-CM | POA: Diagnosis not present

## 2016-03-24 DIAGNOSIS — O98319 Other infections with a predominantly sexual mode of transmission complicating pregnancy, unspecified trimester: Secondary | ICD-10-CM

## 2016-03-24 DIAGNOSIS — O403XX0 Polyhydramnios, third trimester, not applicable or unspecified: Secondary | ICD-10-CM | POA: Diagnosis present

## 2016-03-24 DIAGNOSIS — A630 Anogenital (venereal) warts: Secondary | ICD-10-CM

## 2016-03-24 DIAGNOSIS — O99820 Streptococcus B carrier state complicating pregnancy: Secondary | ICD-10-CM

## 2016-03-24 DIAGNOSIS — O403XX1 Polyhydramnios, third trimester, fetus 1: Secondary | ICD-10-CM

## 2016-03-24 DIAGNOSIS — Z3483 Encounter for supervision of other normal pregnancy, third trimester: Secondary | ICD-10-CM

## 2016-03-24 DIAGNOSIS — Z3A37 37 weeks gestation of pregnancy: Secondary | ICD-10-CM | POA: Diagnosis not present

## 2016-03-24 DIAGNOSIS — Z9884 Bariatric surgery status: Secondary | ICD-10-CM

## 2016-03-24 DIAGNOSIS — O1202 Gestational edema, second trimester: Secondary | ICD-10-CM

## 2016-03-24 NOTE — Progress Notes (Signed)
   PRENATAL VISIT NOTE  Subjective:  Carrie Cardenas is a 25 y.o. G1P0000 at 4327w4d being seen today for ongoing prenatal care.  She is currently monitored for the following issues for this high-risk pregnancy and has Supervision of normal pregnancy in third trimester; History of gastric restrictive surgery; Genital warts complicating pregnancy; Leg swelling in pregnancy in second trimester; LGSIL (low grade squamous intraepithelial dysplasia); Polyhydramnios; and GBS (group B Streptococcus carrier), +RV culture, currently pregnant on her problem list.  Patient reports lower extremity pain which initiates in her hip.  Contractions: Irregular. Vag. Bleeding: None.  Movement: Present. Denies leaking of fluid.   The following portions of the patient's history were reviewed and updated as appropriate: allergies, current medications, past family history, past medical history, past social history, past surgical history and problem list. Problem list updated.  Objective:   Vitals:   03/24/16 1311  BP: 136/84  Pulse: 93  Weight: 257 lb 6.4 oz (116.8 kg)    Fetal Status: Fetal Heart Rate (bpm): NST   Movement: Present     General:  Alert, oriented and cooperative. Patient is in no acute distress.  Skin: Skin is warm and dry. No rash noted.   Cardiovascular: Normal heart rate noted  Respiratory: Normal respiratory effort, no problems with respiration noted  Abdomen: Soft, gravid, appropriate for gestational age. Pain/Pressure: Present     Pelvic:  Cervical exam deferred        Extremities: Normal range of motion.     Mental Status: Normal mood and affect. Normal behavior. Normal judgment and thought content.   Urinalysis:      Assessment and Plan:  Pregnancy: G1P0000 at 5427w4d  1. Encounter for supervision of other normal pregnancy in third trimester Patient is doing well without complaints Patient describes sciatic nerve pain. Discussed the use of a pregnancy support belt  2.  Polyhydramnios in third trimester complication, single or unspecified fetus Normal fluid on 9/28 scan Follow up ultrasound today NST reviewed and reactive Discussed that if fluid continues to be normal, we will not plan to induce labor at 39 weeks  3. GBS (group B Streptococcus carrier), +RV culture, currently pregnant Prophylaxis in labor  Term labor symptoms and general obstetric precautions including but not limited to vaginal bleeding, contractions, leaking of fluid and fetal movement were reviewed in detail with the patient. Please refer to After Visit Summary for other counseling recommendations.  Return in about 1 week (around 03/31/2016) for as scheduled.  Catalina AntiguaPeggy Roxane Puerto, MD

## 2016-03-24 NOTE — Progress Notes (Signed)
Pt reports having pain in pelvis which shoots down her Rt leg. BPP scheduled today and weekly due to polyhydramnios.

## 2016-03-30 ENCOUNTER — Other Ambulatory Visit: Payer: Self-pay | Admitting: Obstetrics and Gynecology

## 2016-03-30 ENCOUNTER — Encounter (HOSPITAL_COMMUNITY): Payer: Self-pay

## 2016-03-30 ENCOUNTER — Inpatient Hospital Stay (HOSPITAL_COMMUNITY)
Admission: AD | Admit: 2016-03-30 | Discharge: 2016-04-04 | DRG: 774 | Disposition: A | Discharge status: Home / Self Care | Payer: Medicaid Other | Source: Ambulatory Visit | Attending: Obstetrics and Gynecology | Admitting: Obstetrics and Gynecology

## 2016-03-30 DIAGNOSIS — O9081 Anemia of the puerperium: Secondary | ICD-10-CM | POA: Diagnosis not present

## 2016-03-30 DIAGNOSIS — O1413 Severe pre-eclampsia, third trimester: Secondary | ICD-10-CM | POA: Diagnosis present

## 2016-03-30 DIAGNOSIS — O99844 Bariatric surgery status complicating childbirth: Secondary | ICD-10-CM | POA: Diagnosis present

## 2016-03-30 DIAGNOSIS — O1414 Severe pre-eclampsia complicating childbirth: Principal | ICD-10-CM | POA: Diagnosis present

## 2016-03-30 DIAGNOSIS — O9962 Diseases of the digestive system complicating childbirth: Secondary | ICD-10-CM | POA: Diagnosis present

## 2016-03-30 DIAGNOSIS — O99820 Streptococcus B carrier state complicating pregnancy: Secondary | ICD-10-CM

## 2016-03-30 DIAGNOSIS — O99824 Streptococcus B carrier state complicating childbirth: Secondary | ICD-10-CM | POA: Diagnosis present

## 2016-03-30 DIAGNOSIS — Z3A38 38 weeks gestation of pregnancy: Secondary | ICD-10-CM

## 2016-03-30 DIAGNOSIS — O368130 Decreased fetal movements, third trimester, not applicable or unspecified: Secondary | ICD-10-CM | POA: Diagnosis present

## 2016-03-30 DIAGNOSIS — D62 Acute posthemorrhagic anemia: Secondary | ICD-10-CM | POA: Diagnosis not present

## 2016-03-30 DIAGNOSIS — O1490 Unspecified pre-eclampsia, unspecified trimester: Secondary | ICD-10-CM | POA: Diagnosis present

## 2016-03-30 DIAGNOSIS — Z9884 Bariatric surgery status: Secondary | ICD-10-CM

## 2016-03-30 DIAGNOSIS — K219 Gastro-esophageal reflux disease without esophagitis: Secondary | ICD-10-CM | POA: Diagnosis present

## 2016-03-30 HISTORY — DX: Severe pre-eclampsia, third trimester: O14.13

## 2016-03-30 LAB — COMPREHENSIVE METABOLIC PANEL
ALT: 56 U/L — AB (ref 14–54)
AST: 40 U/L (ref 15–41)
Albumin: 3.1 g/dL — ABNORMAL LOW (ref 3.5–5.0)
Alkaline Phosphatase: 129 U/L — ABNORMAL HIGH (ref 38–126)
Anion gap: 4 — ABNORMAL LOW (ref 5–15)
BUN: 11 mg/dL (ref 6–20)
CHLORIDE: 107 mmol/L (ref 101–111)
CO2: 24 mmol/L (ref 22–32)
CREATININE: 0.59 mg/dL (ref 0.44–1.00)
Calcium: 8.7 mg/dL — ABNORMAL LOW (ref 8.9–10.3)
GFR calc Af Amer: >60 mL/min (ref >60)
GLUCOSE: 77 mg/dL (ref 65–99)
Potassium: 3.5 mmol/L (ref 3.5–5.1)
SODIUM: 135 mmol/L (ref 135–145)
Total Bilirubin: 0.9 mg/dL (ref 0.3–1.2)
Total Protein: 6.4 g/dL — ABNORMAL LOW (ref 6.5–8.1)

## 2016-03-30 LAB — CBC
HCT: 29.3 % — ABNORMAL LOW (ref 36.0–46.0)
Hemoglobin: 9.9 g/dL — ABNORMAL LOW (ref 12.0–15.0)
MCH: 28.4 pg (ref 26.0–34.0)
MCHC: 33.8 g/dL (ref 30.0–36.0)
MCV: 84 fL (ref 78.0–100.0)
PLATELETS: 166 10*3/uL (ref 150–400)
RBC: 3.49 MIL/uL — AB (ref 3.87–5.11)
RDW: 14.3 % (ref 11.5–15.5)
WBC: 4.1 10*3/uL (ref 4.0–10.5)

## 2016-03-30 LAB — URINALYSIS, ROUTINE W REFLEX MICROSCOPIC
Glucose, UA: NEGATIVE mg/dL
Ketones, ur: 15 mg/dL — AB
NITRITE: NEGATIVE
Protein, ur: 30 mg/dL — AB
SPECIFIC GRAVITY, URINE: 1.02 (ref 1.005–1.030)
pH: 6 (ref 5.0–8.0)

## 2016-03-30 LAB — TYPE AND SCREEN
ABO/RH(D): A POS
Antibody Screen: NEGATIVE

## 2016-03-30 LAB — URINE MICROSCOPIC-ADD ON

## 2016-03-30 LAB — PROTEIN / CREATININE RATIO, URINE
Creatinine, Urine: 260 mg/dL
Protein Creatinine Ratio: 0.13 mg/mg{Cre} (ref 0.00–0.15)
Total Protein, Urine: 33 mg/dL

## 2016-03-30 MED ORDER — MAGNESIUM SULFATE 50 % IJ SOLN
2.0000 g/h | INTRAVENOUS | Status: DC
  Administered 2016-03-30 – 2016-04-01 (×3): 2 g/h via INTRAVENOUS
  Filled 2016-03-30 (×3): qty 80

## 2016-03-30 MED ORDER — ZOLPIDEM TARTRATE 5 MG PO TABS: 5.0000 mg | ORAL_TABLET | Freq: Every evening | ORAL | Status: DC | PRN

## 2016-03-30 MED ORDER — SOD CITRATE-CITRIC ACID 500-334 MG/5ML PO SOLN
30.0000 mL | ORAL | Status: DC | PRN
  Administered 2016-03-30: 30 mL via ORAL
  Filled 2016-03-30: qty 15

## 2016-03-30 MED ORDER — LACTATED RINGERS IV SOLN
500.0000 mL | INTRAVENOUS | Status: DC | PRN
  Administered 2016-04-01: 1000 mL via INTRAVENOUS

## 2016-03-30 MED ORDER — ONDANSETRON HCL 4 MG/2ML IJ SOLN
4.0000 mg | Freq: Four times a day (QID) | INTRAMUSCULAR | Status: DC | PRN
  Administered 2016-03-31: 4 mg via INTRAVENOUS
  Filled 2016-03-30: qty 2

## 2016-03-30 MED ORDER — HYDRALAZINE HCL 20 MG/ML IJ SOLN
10.0000 mg | Freq: Once | INTRAMUSCULAR | Status: AC | PRN
  Administered 2016-04-01: 10 mg via INTRAVENOUS
  Filled 2016-03-30: qty 1

## 2016-03-30 MED ORDER — MAGNESIUM SULFATE BOLUS VIA INFUSION
4.0000 g | Freq: Once | INTRAVENOUS | Status: AC
  Administered 2016-03-30: 4 g via INTRAVENOUS
  Filled 2016-03-30: qty 500

## 2016-03-30 MED ORDER — OXYCODONE-ACETAMINOPHEN 5-325 MG PO TABS: 2.0000 | ORAL_TABLET | ORAL | Status: DC | PRN

## 2016-03-30 MED ORDER — PENICILLIN G POTASSIUM 5000000 UNITS IJ SOLR
2.5000 10*6.[IU] | INTRAVENOUS | Status: DC
  Filled 2016-03-30 (×4): qty 2.5

## 2016-03-30 MED ORDER — OXYTOCIN BOLUS FROM INFUSION
500.0000 mL | Freq: Once | INTRAVENOUS | Status: AC
  Administered 2016-04-01: 500 mL via INTRAVENOUS

## 2016-03-30 MED ORDER — LIDOCAINE HCL (PF) 1 % IJ SOLN
30.0000 mL | INTRAMUSCULAR | Status: AC | PRN
  Administered 2016-04-01: 30 mL via SUBCUTANEOUS
  Filled 2016-03-30: qty 30

## 2016-03-30 MED ORDER — OXYTOCIN 40 UNITS IN LACTATED RINGERS INFUSION - SIMPLE MED
2.5000 [IU]/h | INTRAVENOUS | Status: DC
Start: 2016-03-30 — End: 2016-04-02
  Administered 2016-04-01: 39.96 [IU]/h via INTRAVENOUS
  Filled 2016-03-30 (×2): qty 1000

## 2016-03-30 MED ORDER — MISOPROSTOL 25 MCG QUARTER TABLET
25.0000 ug | ORAL_TABLET | ORAL | Status: DC | PRN
  Administered 2016-03-30 – 2016-03-31 (×3): 25 ug via VAGINAL
  Filled 2016-03-30: qty 0.25
  Filled 2016-03-30: qty 1
  Filled 2016-03-30 (×2): qty 0.25

## 2016-03-30 MED ORDER — PENICILLIN G POTASSIUM 5000000 UNITS IJ SOLR
5.0000 10*6.[IU] | Freq: Once | INTRAVENOUS | Status: DC
  Filled 2016-03-30: qty 5

## 2016-03-30 MED ORDER — LACTATED RINGERS IV SOLN
INTRAVENOUS | Status: DC
  Administered 2016-03-30 – 2016-04-01 (×3): via INTRAVENOUS

## 2016-03-30 MED ORDER — ACETAMINOPHEN 325 MG PO TABS: 650.0000 mg | ORAL_TABLET | ORAL | Status: DC | PRN

## 2016-03-30 MED ORDER — OXYCODONE-ACETAMINOPHEN 5-325 MG PO TABS: 1.0000 | ORAL_TABLET | ORAL | Status: DC | PRN

## 2016-03-30 MED ORDER — TERBUTALINE SULFATE 1 MG/ML IJ SOLN: 0.2500 mg | Freq: Once | INTRAMUSCULAR | Status: DC | PRN

## 2016-03-30 MED ORDER — LABETALOL HCL 5 MG/ML IV SOLN
20.0000 mg | INTRAVENOUS | Status: AC | PRN
  Administered 2016-03-30 – 2016-04-01 (×2): 20 mg via INTRAVENOUS
  Administered 2016-04-01: 40 mg via INTRAVENOUS
  Filled 2016-03-30: qty 4
  Filled 2016-03-30: qty 8
  Filled 2016-03-30: qty 4

## 2016-03-30 NOTE — Anesthesia Pain Management Evaluation Note (Signed)
  CRNA Pain Management Visit Note  Patient: Carrie Cardenas, 25 y.o., female  "Hello I am a [facility] the [facility]. We have an anesthesia team available at all times to provide care throughout the hospital, including epidural management and anesthesia for C-section. I don't know your plan for the delivery whether it a natural birth, water birth, IV sedation, nitrous supplementation, doula or epidural, but we want to meet your pain goals."   1.Was your pain managed to your expectations on prior hospitalizations?   No prior hospitalizations  2.What is your expectation for pain management during this hospitalization?     IV pain meds  3.How can we help you reach that goal?   Record the patient's initial score and the patient's pain goal.   Pain: 0  Pain Goal: 7 [facility] wants you to be able to say your pain was always managed very well.  Laban EmperorMalinova,Herta Hink Hristova 03/30/2016

## 2016-03-30 NOTE — MAU Note (Signed)
Pt unable to leave urine sample

## 2016-03-30 NOTE — MAU Provider Note (Signed)
History     CSN: [id number]  Arrival date and time: 03/30/16 1700   First Provider Initiated Contact with Patient 03/30/16 1833      Chief Complaint  Patient presents with  . Decreased Fetal Movement   HPI   [name] is a 25 y.o. female G1P0000 @ 78w3dhere with increased swelling in her feet and decreased fetal movement. Since her arrival to MAU she has felt her baby moving normally. She reports that her BP has been high occasionally with this pregnancy. She has had transportation issues and has missed some of her OB appointments.   OB History    Gravida Para Term Preterm AB Living   1 0 0 0 0 0   SAB TAB Ectopic Multiple Live Births   0 0 0 0        Past Medical History:  Diagnosis Date  . Depression    pt hospitalized at 25years old for major depression    Past Surgical History:  Procedure Laterality Date  . GASTRIC BYPASS    . SLEEVE GASTROPLASTY  2015    Family History  Problem Relation Age of Onset  . Fibroids Mother   . Anemia Mother   . Pulmonary embolism Mother   . Cancer Maternal Grandmother     Social History  Substance Use Topics  . Smoking status: Never Smoker  . Smokeless tobacco: Never Used  . Alcohol use No    Allergies: No Known Allergies  Prescriptions Prior to Admission  Medication Sig Dispense Refill Last Dose  . acetaminophen (TYLENOL) 500 MG tablet Take 1,000 mg by mouth every 6 (six) hours as needed for mild pain or headache.   03/29/2016 at Unknown time  . ferrous sulfate 325 (65 FE) MG tablet Take 1 tablet (325 mg total) by mouth daily. 30 tablet 3 Past Month at Unknown time  . Prenatal Vit-Fe Fumarate-FA (PRENATAL MULTIVITAMIN) TABS tablet Take 1 tablet by mouth daily at 12 noon.   Past Month at Unknown time   Results for orders placed or performed during the hospital encounter of 03/30/16 (from the past 48 hour(s))  Urinalysis, [facility] reflex microscopic [facility]     Status: Abnormal   Collection Time:  03/30/16  5:15 PM  Result Value Ref Range   Color, Urine ORANGE (A) YELLOW    Comment: BIOCHEMICALS MAY BE AFFECTED BY COLOR   APPearance CLEAR CLEAR   Specific Gravity, Urine 1.020 1.005 - 1.030   pH 6.0 5.0 - 8.0   Glucose, UA NEGATIVE NEGATIVE mg/dL   Hgb urine dipstick LARGE (A) NEGATIVE   Bilirubin Urine SMALL (A) NEGATIVE   Ketones, ur 15 (A) NEGATIVE mg/dL   Protein, ur 30 (A) NEGATIVE mg/dL   Nitrite NEGATIVE NEGATIVE   Leukocytes, UA SMALL (A) NEGATIVE  Protein / creatinine ratio, urine     Status: None   Collection Time: 03/30/16  5:15 PM  Result Value Ref Range   Creatinine, Urine 260.00 mg/dL   Total Protein, Urine 33 mg/dL    Comment: NO NORMAL RANGE ESTABLISHED FOR THIS TEST   Protein Creatinine Ratio 0.13 0.00 - 0.15 mg/mg[Cre]  Urine microscopic-add on     Status: Abnormal   Collection Time: 03/30/16  5:15 PM  Result Value Ref Range   Squamous Epithelial / LPF 6-30 (A) NONE SEEN   WBC, UA 6-30 0 - 5 WBC/hpf   RBC / HPF 6-30 0 - 5 RBC/hpf   Bacteria, UA MANY (A) NONE  SEEN   Urine-Other MUCOUS PRESENT   CBC     Status: Abnormal   Collection Time: 03/30/16  6:46 PM  Result Value Ref Range   WBC 4.1 4.0 - 10.5 K/uL   RBC 3.49 (L) 3.87 - 5.11 MIL/uL   Hemoglobin 9.9 (L) 12.0 - 15.0 g/dL   HCT 29.3 (L) 36.0 - 46.0 %   MCV 84.0 78.0 - 100.0 fL   MCH 28.4 26.0 - 34.0 pg   MCHC 33.8 30.0 - 36.0 g/dL   RDW 14.3 11.5 - 15.5 %   Platelets 166 150 - 400 K/uL  Comprehensive metabolic panel     Status: Abnormal   Collection Time: 03/30/16  6:46 PM  Result Value Ref Range   Sodium 135 135 - 145 mmol/L   Potassium 3.5 3.5 - 5.1 mmol/L   Chloride 107 101 - 111 mmol/L   CO2 24 22 - 32 mmol/L   Glucose, Bld 77 65 - 99 mg/dL   BUN 11 6 - 20 mg/dL   Creatinine, Ser 0.59 0.44 - 1.00 mg/dL   Calcium 8.7 (L) 8.9 - 10.3 mg/dL   Total Protein 6.4 (L) 6.5 - 8.1 g/dL   Albumin 3.1 (L) 3.5 - 5.0 g/dL   AST 40 15 - 41 U/L   ALT 56 (H) 14 - 54 U/L   Alkaline Phosphatase 129  (H) 38 - 126 U/L   Total Bilirubin 0.9 0.3 - 1.2 mg/dL   GFR calc non Af Amer >60 >60 mL/min   GFR calc Af Amer >60 >60 mL/min    Comment: (NOTE) The eGFR has been calculated using the CKD EPI equation. This calculation has not been validated in all clinical situations. eGFR's persistently <60 mL/min signify possible Chronic Kidney Disease.    Anion gap 4 (L) 5 - 15    Review of Systems  Constitutional: Negative for chills and fever.  Eyes: Negative for blurred vision.  Gastrointestinal: Negative for abdominal pain, nausea and vomiting.  Neurological: Negative for headaches.   Physical Exam   Blood pressure 163/97, pulse 69, temperature 98.1 F (36.7 C), temperature source Oral, resp. rate 16, weight 258 lb 4 oz (117.1 kg), last menstrual period 01/06/2016, SpO2 98 %.   Patient Vitals for the past 24 hrs:  BP Temp Temp src Pulse Resp SpO2 Weight  03/30/16 1916 163/97 - - 69 - - -  03/30/16 1909 (!) 156/105 - - 72 - - -  03/30/16 1846 145/96 - - 73 - - -  03/30/16 1831 143/96 - - 75 - - -  03/30/16 1829 (!) 152/106 - - 73 - - -  03/30/16 1711 139/85 98.1 F (36.7 C) Oral 102 16 98 % 258 lb 4 oz (117.1 kg)    Physical Exam  Constitutional: She is oriented to person, place, and time. She appears well-developed and well-nourished. No distress.  Cardiovascular: Normal rate.   Respiratory: Effort normal.  GI: Soft. She exhibits no distension. There is no tenderness. There is no rebound and no guarding.  Genitourinary:  Genitourinary Comments: Dilation: 1 Effacement (%): 20 Cervical Position: Posterior Station: -3 Exam by:: Wilhemena Durie RN  Musculoskeletal: Normal range of motion.  Neurological: She is oriented to person, place, and time. She has normal reflexes.  Negative clonus   Skin: Skin is warm. She is not diaphoretic.  Psychiatric: Her behavior is normal.   Category 1 fetal tracing, + accels, no decels.   MAU Course  Procedures   None  MDM  CBC CMP Protein creatine ratio Labetalol protocol Discussed patient with Dr. Glo Herring & Dr. Baron Sane   Assessment and Plan   A:  1. Preeclampsia, severe, third trimester   2. GBS (group B Streptococcus carrier), +RV culture, currently pregnant   3. History of gastric restrictive surgery     P:  GBS + Admit to Labor and delivery  Continuous fetal monitoring    Lezlie Lye, NP. 03/30/2016 7:49 PM

## 2016-03-30 NOTE — Progress Notes (Signed)
Patient ID: Carrie Cardenas, female   DOB: 08/24/1990, 25 y.o.   MRN: [id number]  Feeling well; denies H/A or blurred vision  BPs 145/92, 136/84, other VSS FHR 130s, +accels, no decels Some UI Cx 1/60/-2; unable to place foley due to pt discomfort  IUP@38 .3wks Severe pre-e GBS pos  Will start Mag sulfate due to intermittent severe range BPs Cytotec placed and plan to repeat- foley when/if able PCN with labor/ROM  Cam HaiSHAW, Amyia Lodwick CNM 03/30/2016 10:05 PM

## 2016-03-30 NOTE — H&P (Signed)
History     CSN: [id number]  Arrival date and time: 03/30/16 1700   First Provider Initiated Contact with Patient 03/30/16 1833      Chief Complaint  Patient presents with  . Decreased Fetal Movement   HPI   [name] is a 25 y.o. female G1P0000 @ 72w3dhere with increased swelling in her feet and decreased fetal movement. Since her arrival to [facility]U she has felt her baby moving normally. She reports that her BP has been high occasionally with this pregnancy. She has had transportation issues and has missed some of her OB appointments.   Denies vaginal bleeding or leaking of fluid. Denies fever.   OB History    Gravida Para Term Preterm AB Living   1 0 0 0 0 0   SAB TAB Ectopic Multiple Live Births   0 0 0 0        Past Medical History:  Diagnosis Date  . Depression    pt hospitalized at 25years old for major depression    Past Surgical History:  Procedure Laterality Date  . GASTRIC BYPASS    . SLEEVE GASTROPLASTY  2015    Family History  Problem Relation Age of Onset  . Fibroids Mother   . Anemia Mother   . Pulmonary embolism Mother   . Cancer Maternal Grandmother     Social History  Substance Use Topics  . Smoking status: Never Smoker  . Smokeless tobacco: Never Used  . Alcohol use No    Allergies: No Known Allergies  Prescriptions Prior to Admission  Medication Sig Dispense Refill Last Dose  . acetaminophen (TYLENOL) 500 MG tablet Take 1,000 mg by mouth every 6 (six) hours as needed for mild pain or headache.   03/29/2016 at Unknown time  . ferrous sulfate 325 (65 FE) MG tablet Take 1 tablet (325 mg total) by mouth daily. 30 tablet 3 Past Month at Unknown time  . Prenatal Vit-Fe Fumarate-FA (PRENATAL MULTIVITAMIN) TABS tablet Take 1 tablet by mouth daily at 12 noon.   Past Month at Unknown time   Results for orders placed or performed during the hospital encounter of 03/30/16 (from the past 48 hour(s))  Urinalysis, [facility] w reflex  [facility]     Status: Abnormal   Collection Time: 03/30/16  5:15 PM  Result Value Ref Range   Color, Urine ORANGE (A) YELLOW    Comment: BIOCHEMICALS MAY BE AFFECTED BY COLOR   APPearance CLEAR CLEAR   Specific Gravity, Urine 1.020 1.005 - 1.030   pH 6.0 5.0 - 8.0   Glucose, UA NEGATIVE NEGATIVE mg/dL   Hgb urine dipstick LARGE (A) NEGATIVE   Bilirubin Urine SMALL (A) NEGATIVE   Ketones, ur 15 (A) NEGATIVE mg/dL   Protein, ur 30 (A) NEGATIVE mg/dL   Nitrite NEGATIVE NEGATIVE   Leukocytes, UA SMALL (A) NEGATIVE  Protein / creatinine ratio, urine     Status: None   Collection Time: 03/30/16  5:15 PM  Result Value Ref Range   Creatinine, Urine 260.00 mg/dL   Total Protein, Urine 33 mg/dL    Comment: NO NORMAL RANGE ESTABLISHED FOR THIS TEST   Protein Creatinine Ratio 0.13 0.00 - 0.15 mg/mg[Cre]  Urine microscopic-add on     Status: Abnormal   Collection Time: 03/30/16  5:15 PM  Result Value Ref Range   Squamous Epithelial / LPF 6-30 (A) NONE SEEN   WBC, UA 6-30 0 - 5 WBC/hpf   RBC / HPF 6-30  0 - 5 RBC/hpf   Bacteria, UA MANY (A) NONE SEEN   Urine-Other MUCOUS PRESENT   CBC     Status: Abnormal   Collection Time: 03/30/16  6:46 PM  Result Value Ref Range   WBC 4.1 4.0 - 10.5 K/uL   RBC 3.49 (L) 3.87 - 5.11 MIL/uL   Hemoglobin 9.9 (L) 12.0 - 15.0 g/dL   HCT 29.3 (L) 36.0 - 46.0 %   MCV 84.0 78.0 - 100.0 fL   MCH 28.4 26.0 - 34.0 pg   MCHC 33.8 30.0 - 36.0 g/dL   RDW 14.3 11.5 - 15.5 %   Platelets 166 150 - 400 K/uL  Comprehensive metabolic panel     Status: Abnormal   Collection Time: 03/30/16  6:46 PM  Result Value Ref Range   Sodium 135 135 - 145 mmol/L   Potassium 3.5 3.5 - 5.1 mmol/L   Chloride 107 101 - 111 mmol/L   CO2 24 22 - 32 mmol/L   Glucose, Bld 77 65 - 99 mg/dL   BUN 11 6 - 20 mg/dL   Creatinine, Ser 0.59 0.44 - 1.00 mg/dL   Calcium 8.7 (L) 8.9 - 10.3 mg/dL   Total Protein 6.4 (L) 6.5 - 8.1 g/dL   Albumin 3.1 (L) 3.5 - 5.0 g/dL   AST 40  15 - 41 U/L   ALT 56 (H) 14 - 54 U/L   Alkaline Phosphatase 129 (H) 38 - 126 U/L   Total Bilirubin 0.9 0.3 - 1.2 mg/dL   GFR calc non Af Amer >60 >60 mL/min   GFR calc Af Amer >60 >60 mL/min    Comment: (NOTE) The eGFR has been calculated using the CKD EPI equation. This calculation has not been validated in all clinical situations. eGFR's persistently <60 mL/min signify possible Chronic Kidney Disease.    Anion gap 4 (L) 5 - 15    Review of Systems  Constitutional: Negative for chills and fever.  Eyes: Negative for blurred vision.  Gastrointestinal: Negative for abdominal pain, nausea and vomiting.  Neurological: Negative for headaches.   Physical Exam   Blood pressure 163/97, pulse 69, temperature 98.1 F (36.7 C), temperature source Oral, resp. rate 16, weight 258 lb 4 oz (117.1 kg), last menstrual period 01/06/2016, SpO2 98 %.   Patient Vitals for the past 24 hrs:  BP Temp Temp src Pulse Resp SpO2 Weight  03/30/16 1916 163/97 - - 69 - - -  03/30/16 1909 (!) 156/105 - - 72 - - -  03/30/16 1846 145/96 - - 73 - - -  03/30/16 1831 143/96 - - 75 - - -  03/30/16 1829 (!) 152/106 - - 73 - - -  03/30/16 1711 139/85 98.1 F (36.7 C) Oral 102 16 98 % 258 lb 4 oz (117.1 kg)    Physical Exam  Constitutional: She is oriented to person, place, and time. She appears well-developed and well-nourished. No distress.  Cardiovascular: Normal rate.   Respiratory: Effort normal.  GI: Soft. She exhibits no distension. There is no tenderness. There is no rebound and no guarding.  Genitourinary:  Genitourinary Comments: Dilation: 1 Effacement (%): 20 Cervical Position: Posterior Station: -3 Exam by:: Rachel Schmidt RN  Musculoskeletal: Normal range of motion.  Neurological: She is oriented to person, place, and time. She has normal reflexes.  Negative clonus   Skin: Skin is warm. She is not diaphoretic.  Psychiatric: Her behavior is normal.   Category 1 fetal tracing, + accels, no  decels.     Assessment and Plan   A:  1. Preeclampsia, severe, third trimester   2. GBS (group B Streptococcus carrier), +RV culture, currently pregnant   3. History of gastric restrictive surgery     P:  GBS + Admit to Labor and delivery  Continuous fetal monitoring     I , NP. 03/30/2016 7:49 PM  

## 2016-03-30 NOTE — MAU Note (Signed)
Feet was turning purple, states swelling has gone down a whole lot. Decrease in fetal movement. Doesn't recall any movement today. (pt arrived by EMS).  Was feeling kind of  SOB earlier, feeling has passed.  Pelvic cramping

## 2016-03-31 ENCOUNTER — Inpatient Hospital Stay (HOSPITAL_COMMUNITY): Payer: Medicaid Other | Admitting: Anesthesiology

## 2016-03-31 ENCOUNTER — Other Ambulatory Visit: Payer: Self-pay | Admitting: Obstetrics and Gynecology

## 2016-03-31 ENCOUNTER — Ambulatory Visit (HOSPITAL_COMMUNITY): Admission: RE | Admit: 2016-03-31 | Payer: Medicaid Other | Source: Ambulatory Visit

## 2016-03-31 ENCOUNTER — Encounter (HOSPITAL_COMMUNITY): Payer: Self-pay

## 2016-03-31 LAB — COMPREHENSIVE METABOLIC PANEL
ALBUMIN: 2.8 g/dL — AB (ref 3.5–5.0)
ALK PHOS: 140 U/L — AB (ref 38–126)
ALT: 53 U/L (ref 14–54)
AST: 39 U/L (ref 15–41)
Anion gap: 7 (ref 5–15)
BUN: 10 mg/dL (ref 6–20)
CALCIUM: 8 mg/dL — AB (ref 8.9–10.3)
CHLORIDE: 106 mmol/L (ref 101–111)
CO2: 21 mmol/L — AB (ref 22–32)
CREATININE: 0.59 mg/dL (ref 0.44–1.00)
GFR calc Af Amer: >60 mL/min (ref >60)
GFR calc non Af Amer: >60 mL/min (ref >60)
GLUCOSE: 85 mg/dL (ref 65–99)
Potassium: 3.5 mmol/L (ref 3.5–5.1)
SODIUM: 134 mmol/L — AB (ref 135–145)
Total Bilirubin: 0.5 mg/dL (ref 0.3–1.2)
Total Protein: 6.1 g/dL — ABNORMAL LOW (ref 6.5–8.1)

## 2016-03-31 LAB — CBC
HCT: 27.1 % — ABNORMAL LOW (ref 36.0–46.0)
HEMOGLOBIN: 9.4 g/dL — AB (ref 12.0–15.0)
MCH: 28.7 pg (ref 26.0–34.0)
MCHC: 34.7 g/dL (ref 30.0–36.0)
MCV: 82.9 fL (ref 78.0–100.0)
Platelets: 164 10*3/uL (ref 150–400)
RBC: 3.27 MIL/uL — AB (ref 3.87–5.11)
RDW: 14.3 % (ref 11.5–15.5)
WBC: 4.9 10*3/uL (ref 4.0–10.5)

## 2016-03-31 LAB — RPR: RPR Ser Ql: NONREACTIVE

## 2016-03-31 MED ORDER — EPHEDRINE 5 MG/ML INJ
10.0000 mg | INTRAVENOUS | Status: DC | PRN
  Filled 2016-03-31: qty 4

## 2016-03-31 MED ORDER — LACTATED RINGERS IV SOLN: 500.0000 mL | Freq: Once | INTRAVENOUS | Status: DC

## 2016-03-31 MED ORDER — PHENYLEPHRINE 40 MCG/ML (10ML) SYRINGE FOR IV PUSH (FOR BLOOD PRESSURE SUPPORT)
PREFILLED_SYRINGE | INTRAVENOUS | Status: AC
  Filled 2016-03-31: qty 10

## 2016-03-31 MED ORDER — FENTANYL 2.5 MCG/ML BUPIVACAINE 1/10 % EPIDURAL INFUSION (WH - ANES)
INTRAMUSCULAR | Status: AC
  Filled 2016-03-31: qty 125

## 2016-03-31 MED ORDER — PHENYLEPHRINE 40 MCG/ML (10ML) SYRINGE FOR IV PUSH (FOR BLOOD PRESSURE SUPPORT)
80.0000 ug | PREFILLED_SYRINGE | INTRAVENOUS | Status: DC | PRN
  Filled 2016-03-31: qty 5

## 2016-03-31 MED ORDER — LIDOCAINE HCL (PF) 1 % IJ SOLN
INTRAMUSCULAR | Status: DC | PRN
  Administered 2016-03-31: 4 mL via EPIDURAL
  Administered 2016-03-31: 6 mL via EPIDURAL

## 2016-03-31 MED ORDER — FENTANYL 2.5 MCG/ML BUPIVACAINE 1/10 % EPIDURAL INFUSION (WH - ANES)
14.0000 mL/h | INTRAMUSCULAR | Status: DC | PRN
  Administered 2016-03-31 – 2016-04-01 (×4): 14 mL/h via EPIDURAL
  Filled 2016-03-31 (×4): qty 125

## 2016-03-31 MED ORDER — PENICILLIN G POTASSIUM 5000000 UNITS IJ SOLR
2.5000 10*6.[IU] | INTRAVENOUS | Status: DC
  Administered 2016-03-31 – 2016-04-01 (×6): 2.5 10*6.[IU] via INTRAVENOUS
  Filled 2016-03-31 (×12): qty 2.5

## 2016-03-31 MED ORDER — TERBUTALINE SULFATE 1 MG/ML IJ SOLN: 0.2500 mg | Freq: Once | INTRAMUSCULAR | Status: DC | PRN

## 2016-03-31 MED ORDER — DIPHENHYDRAMINE HCL 50 MG/ML IJ SOLN: 12.5000 mg | INTRAMUSCULAR | Status: DC | PRN

## 2016-03-31 MED ORDER — PENICILLIN G POTASSIUM 5000000 UNITS IJ SOLR
5.0000 10*6.[IU] | Freq: Once | INTRAMUSCULAR | Status: AC
  Administered 2016-03-31: 5 10*6.[IU] via INTRAVENOUS
  Filled 2016-03-31: qty 5

## 2016-03-31 MED ORDER — OXYTOCIN 40 UNITS IN LACTATED RINGERS INFUSION - SIMPLE MED
1.0000 m[IU]/min | INTRAVENOUS | Status: DC
  Administered 2016-03-31: 2 m[IU]/min via INTRAVENOUS

## 2016-03-31 MED ORDER — LACTATED RINGERS IV SOLN
500.0000 mL | Freq: Once | INTRAVENOUS | Status: AC
  Administered 2016-04-01: 200 mL via INTRAVENOUS

## 2016-03-31 NOTE — Anesthesia Procedure Notes (Addendum)
Epidural Patient location during procedure: OB Start time: 03/31/2016 12:31 PM End time: 03/31/2016 12:37 PM  Staffing Anesthesiologist: Linton RumpALLAN, JENNIFER DICKERSON Performed: anesthesiologist   Preanesthetic Checklist Completed: patient identified, surgical consent, pre-op evaluation, timeout performed, IV checked, risks and benefits discussed and monitors and equipment checked  Epidural Patient position: sitting Prep: site prepped and draped and DuraPrep Patient monitoring: continuous pulse ox and blood pressure Approach: midline Location: L2-L3 Injection technique: LOR air  Needle:  Needle type: Tuohy  Needle gauge: 17 G Needle length: 9 cm and 9 Needle insertion depth: 7 cm Catheter type: closed end flexible Catheter size: 19 Gauge Catheter at skin depth: 12 cm Test dose: negative and 1.5% lidocaine  Assessment Events: blood not aspirated, injection not painful, no injection resistance, negative IV test and no paresthesia  Additional Notes Epidural placed easily on first attempt.Reason for block:procedure for pain

## 2016-03-31 NOTE — Progress Notes (Signed)
Pt doing well at this time. She has not tolerated exams well thus far. Discussed with pt that she could get an early epidural so that a foley bulb could be placed. Pt is in agreement with that plan. With epidural in place pt tolerated placement of epidural well. Pt is category 1 at this time. Cervix2/50/-3. Repeat labs where all wnl include LFTs and PLT

## 2016-03-31 NOTE — Progress Notes (Signed)
Patient resting comfortably in bed with epidural in place and magnesium sulfate infusing at 2 grams per hour. Patient denies headache, blurry vsion, epigastric pain. FHR CAT 1 with moderate variability and accels. Contractions are irregular and infrequent  Plan: Foley bulb and consider pitocin if cervix favorable and FHR appropriate. Luna KitchensKathryn Ares Cardozo CNM

## 2016-03-31 NOTE — Anesthesia Preprocedure Evaluation (Signed)
Anesthesia Evaluation  Patient identified by MRN, date of birth, ID band Patient awake    Reviewed: Allergy & Precautions, NPO status , Patient's Chart, lab work & pertinent test results  History of Anesthesia Complications Negative for: history of anesthetic complications  Airway Mallampati: III  TM Distance: >3 FB Neck ROM: Full    Dental  (+) Teeth Intact   Pulmonary neg pulmonary ROS,    Pulmonary exam normal breath sounds clear to auscultation       Cardiovascular hypertension (pre-eclampsia),  Rhythm:Regular Rate:Normal     Neuro/Psych PSYCHIATRIC DISORDERS Depression negative neurological ROS     GI/Hepatic Neg liver ROS, GERD  ,  Endo/Other  negative endocrine ROS  Renal/GU negative Renal ROS     Musculoskeletal   Abdominal (+) + obese,   Peds  Hematology negative hematology ROS (+)   Anesthesia Other Findings   Reproductive/Obstetrics (+) Pregnancy                             Anesthesia Physical Anesthesia Plan  ASA: III  Anesthesia Plan: Epidural   Post-op Pain Management:    Induction:   Airway Management Planned: Natural Airway  Additional Equipment:   Intra-op Plan:   Post-operative Plan:   Informed Consent: I have reviewed the patients History and Physical, chart, labs and discussed the procedure including the risks, benefits and alternatives for the proposed anesthesia with the patient or authorized representative who has indicated his/her understanding and acceptance.     Plan Discussed with:   Anesthesia Plan Comments: (I have discussed risks of neuraxial anesthesia including but not limited to infection, bleeding, nerve injury, back pain, headache, seizures, and failure of block. Patient denies bleeding disorders and is not currently anticoagulated. Labs have been reviewed. Risks and benefits discussed. All patient's questions answered.   Platelets 164)         Anesthesia Quick Evaluation

## 2016-03-31 NOTE — Progress Notes (Signed)
Patient seen and examined. SVE 4/50/-3 ; intact membranes. Patient's blood pressure well-controlled on magnesium. Will begin PCN for GBS positive.  FHR Cat 1. Begin pitocin; patient agrees to plan of care. Anticipate NSVD. Luna KitchensKathryn Demetres Prochnow CNM

## 2016-03-31 NOTE — Progress Notes (Signed)
LABOR PROGRESS NOTE  Carrie Cardenas is a 25 y.o. G1P0000 at 4313w4d  admitted for IOL for severe BPs.  Subjective: Doing well. No concerns.  Objective: BP 134/77   Pulse 93   Temp 98.7 F (37.1 C) (Oral)   Resp 18   Ht 5\' 5"  (1.651 m)   Wt 117 kg (258 lb)   LMP 01/06/2016 (Approximate)   SpO2 98%   BMI 42.93 kg/m  or  Vitals:   03/30/16 2225 03/30/16 2301 03/31/16 0001 03/31/16 0101  BP: 135/82 (!) 145/88 (!) 154/104 134/77  Pulse: 78 70 (!) 104 93  Resp: 18 18 18 18   Temp:      TempSrc:      SpO2:      Weight:      Height:        Dilation: 1 Effacement (%): 60 Cervical Position: Posterior Station: -2 Presentation: Vertex Exam by:: Philipp DeputyKim Shaw CNM  Labs: Lab Results  Component Value Date   WBC 4.1 03/30/2016   HGB 9.9 (L) 03/30/2016   HCT 29.3 (L) 03/30/2016   MCV 84.0 03/30/2016   PLT 166 03/30/2016    Patient Active Problem List   Diagnosis Date Noted  . Gestational hypertension, antepartum 03/30/2016  . Severe preeclampsia, third trimester 03/30/2016  . GBS (group B Streptococcus carrier), +RV culture, currently pregnant 03/24/2016  . Polyhydramnios 02/08/2016  . LGSIL (low grade squamous intraepithelial dysplasia) 01/19/2016  . Leg swelling in pregnancy in second trimester 01/12/2016  . Genital warts complicating pregnancy 12/16/2015  . Supervision of normal pregnancy in third trimester 09/30/2015  . History of gastric restrictive surgery 09/30/2015    Assessment / Plan: 25 y.o. G1P0000 at 4813w4d here for IOL for severe range BPs. On Mag gtt.  Labor: s/p Cytotec x 1. Pt unable to tolerate Foley placement. Will give another Cytotec and recheck in 4 hours. Fetal Wellbeing:  Category I Pain Control:  Well-controlled. Anticipated MOD:  SVD  Hilton SinclairKaty D Darus Hershman, MD 03/31/2016, 2:41 AM

## 2016-04-01 ENCOUNTER — Encounter (HOSPITAL_COMMUNITY): Payer: Self-pay | Admitting: General Practice

## 2016-04-01 DIAGNOSIS — Z3A38 38 weeks gestation of pregnancy: Secondary | ICD-10-CM

## 2016-04-01 DIAGNOSIS — O99824 Streptococcus B carrier state complicating childbirth: Secondary | ICD-10-CM

## 2016-04-01 LAB — MAGNESIUM
MAGNESIUM: 5.1 mg/dL — AB (ref 1.7–2.4)
MAGNESIUM: 6.4 mg/dL — AB (ref 1.7–2.4)

## 2016-04-01 MED ORDER — OXYTOCIN 40 UNITS IN LACTATED RINGERS INFUSION - SIMPLE MED
10.0000 [IU]/h | INTRAVENOUS | Status: DC
  Administered 2016-04-02: 2.5 [IU]/h via INTRAVENOUS
  Filled 2016-04-01: qty 1000

## 2016-04-01 MED ORDER — LABETALOL HCL 5 MG/ML IV SOLN
INTRAVENOUS | Status: AC
  Administered 2016-04-01: 40 mg via INTRAVENOUS
  Filled 2016-04-01: qty 8

## 2016-04-01 MED ORDER — LIDOCAINE-EPINEPHRINE (PF) 2 %-1:200000 IJ SOLN
INTRAMUSCULAR | Status: DC | PRN
  Administered 2016-04-01: 2 mL via INTRADERMAL
  Administered 2016-04-01 (×2): 3 mL via INTRADERMAL
  Administered 2016-04-01: 2 mL via INTRADERMAL

## 2016-04-01 MED ORDER — LABETALOL HCL 5 MG/ML IV SOLN
40.0000 mg | Freq: Once | INTRAVENOUS | Status: AC
  Administered 2016-04-01: 40 mg via INTRAVENOUS

## 2016-04-01 MED ORDER — MISOPROSTOL 200 MCG PO TABS
ORAL_TABLET | ORAL | Status: AC
  Filled 2016-04-01: qty 4

## 2016-04-01 MED ORDER — CARBOPROST TROMETHAMINE 250 MCG/ML IM SOLN
250.0000 ug | Freq: Once | INTRAMUSCULAR | Status: AC
  Administered 2016-04-01: 250 ug via INTRAMUSCULAR

## 2016-04-01 MED ORDER — DIPHENOXYLATE-ATROPINE 2.5-0.025 MG PO TABS
2.0000 | ORAL_TABLET | Freq: Four times a day (QID) | ORAL | Status: DC | PRN
  Administered 2016-04-01: 2 via ORAL
  Filled 2016-04-01 (×2): qty 2

## 2016-04-01 MED ORDER — MISOPROSTOL 200 MCG PO TABS
800.0000 ug | ORAL_TABLET | Freq: Once | ORAL | Status: AC
  Administered 2016-04-01: 800 ug via RECTAL

## 2016-04-01 NOTE — Progress Notes (Signed)
Patient ID: [name], female   DOB: 08/27/1990, 25 y.o.   MRN: [id number]  Comfortable in left lat Sims; rec'd IV labetalol 1hr ago  BP 145/92, other VSS FHR 120s, min variability, +accels Ctx irreg q 3-6 mins w/ Pit @ 4730mu/min Cx 9/100/vtx +1  IUP@term  Pre-e, severe BPs  Check cx in 1-2 hrs Mag level pending  Cam HaiSHAW, KIMBERLY CNM 04/01/2016 6:25 PM

## 2016-04-01 NOTE — Progress Notes (Signed)
Pt comfortable w/epidural.  Pitocin was a little slow to be increased over night despite irregular ctx and little cervical change.  AROM w/clear fluid and IUPC.  CTX q 2-6 minutes w/some coupling.  FHR 120's, Cat 1.  PIt at 8522mu/min.  MgSO4 @ 2gm/hr.  Had a brief period of decreased urine output.  Mgs04 level was 5.1.  Responded to IVF bolus.

## 2016-04-01 NOTE — Progress Notes (Signed)
Carrie Cardenas is a 25 y.o. G1P0000 at 4056w5d admitted for induction of labor due to Pre-eclamptic toxemia of pregnancy..  Subjective: Comfortable w/ epidural  Objective: BP 122/78   Pulse 74   Temp 97.8 F (36.6 C)   Resp 16   Ht 5\' 5"  (1.651 m)   Wt 117 kg (258 lb)   LMP 01/06/2016 (Approximate)   SpO2 98%   BMI 42.93 kg/m  I/O last 3 completed shifts: In: 8132.1 [P.O.:1623; I.V.:5816.9; Other:242.2; IV Piggyback:450] Out: 2620 [Urine:2620] Total I/O In: 1391 [P.O.:60; I.V.:1131; IV Piggyback:200] Out: 1100 [Urine:1100]  FHT:  FHR: 120-130 bpm, variability: moderate,  accelerations:  Present,  decelerations:  Absent UC:   regular, every 4 minutes w/ Pit @ 6024mu/min; MVUs less than adequate now- Pit increased SVE:   Dilation: 7.5 Effacement (%): 100 Station: -1 Exam by:: shaw  Labs: Lab Results  Component Value Date   WBC 4.9 03/31/2016   HGB 9.4 (L) 03/31/2016   HCT 27.1 (L) 03/31/2016   MCV 82.9 03/31/2016   PLT 164 03/31/2016    Assessment / Plan: IUP@term  Severe pre-e; BPs stable Active labor  Will check cx in next 2-3 hrs Anticipate SVD  SHAW, KIMBERLY CNM 04/01/2016, 2:47 PM

## 2016-04-01 NOTE — Progress Notes (Signed)
  Pt seen and evaluated. Epidural placed. Cannot feel contractions but reports discomfort. Contractions every 1-2 minutes on monitor, adequate MVUs >200 in 10 minutes. FHR 120's, category 1 tracing. Labetalol required x2 (total 60mg ) an hour ago for presures 160-177/87-93. Pressures now 133/85. No symptoms of headache, vision changes, stomach pain.  Dilation: 6 Effacement (%): 80 Cervical Position: Posterior Station: -1 Presentation: Vertex Exam by:: m wilkins rnc  Anticipate vaginal delivery  Carrie PattyAngela Delania Ferg, DO PGY-1, [facility] 04/01/2016 10:53 AM

## 2016-04-01 NOTE — Progress Notes (Signed)
Review Chart for possible admission.

## 2016-04-02 LAB — CBC
HCT: 19.1 % — ABNORMAL LOW (ref 36.0–46.0)
HEMATOCRIT: 20.5 % — AB (ref 36.0–46.0)
Hemoglobin: 7.4 g/dL — ABNORMAL LOW (ref 12.0–15.0)
Hemoglobin: 6.9 g/dL — CL (ref 12.0–15.0)
MCH: 29.4 pg (ref 26.0–34.0)
MCH: 29.9 pg (ref 26.0–34.0)
MCHC: 36.1 g/dL — AB (ref 30.0–36.0)
MCHC: 36.1 g/dL — AB (ref 30.0–36.0)
MCV: 81.3 fL (ref 78.0–100.0)
MCV: 82.7 fL (ref 78.0–100.0)
PLATELETS: 157 10*3/uL (ref 150–400)
Platelets: 171 10*3/uL (ref 150–400)
RBC: 2.31 MIL/uL — ABNORMAL LOW (ref 3.87–5.11)
RBC: 2.52 MIL/uL — AB (ref 3.87–5.11)
RDW: 14.6 % (ref 11.5–15.5)
RDW: 14.8 % (ref 11.5–15.5)
WBC: 16.9 10*3/uL — ABNORMAL HIGH (ref 4.0–10.5)
WBC: 14.8 10*3/uL — AB (ref 4.0–10.5)

## 2016-04-02 LAB — TYPE AND SCREEN
ABO/RH(D): A POS
Antibody Screen: NEGATIVE

## 2016-04-02 MED ORDER — COCONUT OIL OIL: 1.0000 "application " | TOPICAL_OIL | Status: DC | PRN

## 2016-04-02 MED ORDER — DIBUCAINE 1 % RE OINT: 1.0000 "application " | TOPICAL_OINTMENT | RECTAL | Status: DC | PRN

## 2016-04-02 MED ORDER — TETANUS-DIPHTH-ACELL PERTUSSIS 5-2.5-18.5 LF-MCG/0.5 IM SUSP: 0.5000 mL | Freq: Once | INTRAMUSCULAR | Status: DC

## 2016-04-02 MED ORDER — ONDANSETRON HCL 4 MG/2ML IJ SOLN: 4.0000 mg | INTRAMUSCULAR | Status: DC | PRN

## 2016-04-02 MED ORDER — ACETAMINOPHEN 325 MG PO TABS: 650.0000 mg | ORAL_TABLET | ORAL | Status: DC | PRN

## 2016-04-02 MED ORDER — ZOLPIDEM TARTRATE 5 MG PO TABS: 5.0000 mg | ORAL_TABLET | Freq: Every evening | ORAL | Status: DC | PRN

## 2016-04-02 MED ORDER — LACTATED RINGERS IV SOLN
INTRAVENOUS | Status: DC
  Administered 2016-04-02 (×2): via INTRAVENOUS

## 2016-04-02 MED ORDER — PRENATAL MULTIVITAMIN CH
1.0000 | ORAL_TABLET | Freq: Every day | ORAL | Status: DC
  Administered 2016-04-02 – 2016-04-04 (×3): 1 via ORAL
  Filled 2016-04-02 (×3): qty 1

## 2016-04-02 MED ORDER — PNEUMOCOCCAL VAC POLYVALENT 25 MCG/0.5ML IJ INJ
0.5000 mL | INJECTION | INTRAMUSCULAR | Status: DC
  Filled 2016-04-02: qty 0.5

## 2016-04-02 MED ORDER — SENNOSIDES-DOCUSATE SODIUM 8.6-50 MG PO TABS
2.0000 | ORAL_TABLET | ORAL | Status: DC
  Administered 2016-04-02 – 2016-04-04 (×3): 2 via ORAL
  Filled 2016-04-02 (×3): qty 2

## 2016-04-02 MED ORDER — SIMETHICONE 80 MG PO CHEW: 80.0000 mg | CHEWABLE_TABLET | ORAL | Status: DC | PRN

## 2016-04-02 MED ORDER — BENZOCAINE-MENTHOL 20-0.5 % EX AERO: 1.0000 "application " | INHALATION_SPRAY | CUTANEOUS | Status: DC | PRN

## 2016-04-02 MED ORDER — FERROUS SULFATE 325 (65 FE) MG PO TABS
325.0000 mg | ORAL_TABLET | Freq: Two times a day (BID) | ORAL | Status: DC
  Administered 2016-04-02 – 2016-04-04 (×5): 325 mg via ORAL
  Filled 2016-04-02 (×5): qty 1

## 2016-04-02 MED ORDER — WITCH HAZEL-GLYCERIN EX PADS: 1.0000 "application " | MEDICATED_PAD | CUTANEOUS | Status: DC | PRN

## 2016-04-02 MED ORDER — ONDANSETRON HCL 4 MG PO TABS: 4.0000 mg | ORAL_TABLET | ORAL | Status: DC | PRN

## 2016-04-02 MED ORDER — MAGNESIUM SULFATE 50 % IJ SOLN
2.0000 g/h | INTRAMUSCULAR | Status: AC
  Administered 2016-04-02: 2 g/h via INTRAVENOUS
  Filled 2016-04-02 (×2): qty 80

## 2016-04-02 MED ORDER — DIPHENHYDRAMINE HCL 25 MG PO CAPS: 25.0000 mg | ORAL_CAPSULE | Freq: Four times a day (QID) | ORAL | Status: DC | PRN

## 2016-04-02 MED ORDER — IBUPROFEN 600 MG PO TABS
600.0000 mg | ORAL_TABLET | Freq: Four times a day (QID) | ORAL | Status: DC
  Administered 2016-04-02 – 2016-04-04 (×11): 600 mg via ORAL
  Filled 2016-04-02 (×12): qty 1

## 2016-04-02 NOTE — Plan of Care (Signed)
Problem: Activity: Goal: Will verbalize the importance of balancing activity with adequate rest periods Outcome: Completed/Met Date Met: 04/02/16 OOB to Bathroom  Problem: Coping: Goal: Ability to cope will improve Outcome: Completed/Met Date Met: 04/02/16 Pt education done  Problem: Life Cycle: Goal: Risk for postpartum hemorrhage will decrease Outcome: Completed/Met Date Met: 04/02/16 Pt education. Pt verbalized understanding of same.  Problem: Nutritional: Goal: Mother's verbalization of comfort with breastfeeding process will improve Outcome: Not Met (add Reason) Pt. Stated that "i am too tired to breast feed, I want the baby to have a bottle.  Problem: Role Relationship: Goal: Ability to demonstrate positive interaction with newborn will improve Outcome: Completed/Met Date Met: 04/02/16 Holding baby in blanket.  Problem: Bowel/Gastric: Goal: Gastrointestinal status will improve Outcome: Completed/Met Date Met: 04/02/16 Eating  And taking Senokot tabs  Problem: Urinary Elimination: Goal: Ability to reestablish a normal urinary elimination pattern will improve Outcome: Not Met (add Reason) Foley cath insitu.

## 2016-04-02 NOTE — Progress Notes (Signed)
Post Partum Day 1 Subjective: no complaints and tolerating PO  Objective: Blood pressure 111/84, pulse 67, temperature 98.2 F (36.8 C), temperature source Oral, resp. rate 18, height 5\' 5"  (1.651 m), weight 117 kg (258 lb), last menstrual period 01/06/2016, SpO2 100 %, unknown if currently breastfeeding.  Physical Exam:  General: alert, cooperative and no distress Lochia: appropriate Uterine Fundus: firm Incision: n/a  DVT Evaluation: No evidence of DVT seen on physical exam. CMP Latest Ref Rng & Units 03/31/2016 03/30/2016 03/10/2016  Glucose 65 - 99 mg/dL 85 77 78  BUN 6 - 20 mg/dL 10 11 6   Creatinine 0.44 - 1.00 mg/dL 4.130.59 2.440.59 0.100.49  Sodium 135 - 145 mmol/L 134(L) 135 136  Potassium 3.5 - 5.1 mmol/L 3.5 3.5 3.5  Chloride 101 - 111 mmol/L 106 107 108  CO2 22 - 32 mmol/L 21(L) 24 23  Calcium 8.9 - 10.3 mg/dL 8.0(L) 8.7(L) 8.7(L)  Total Protein 6.5 - 8.1 g/dL 6.1(L) 6.4(L) 6.3(L)  Total Bilirubin 0.3 - 1.2 mg/dL 0.5 0.9 0.6  Alkaline Phos 38 - 126 U/L 140(H) 129(H) 111  AST 15 - 41 U/L 39 40 38  ALT 14 - 54 U/L 53 56(H) 61(H)     Recent Labs  04/02/16 0038 04/02/16 0559  HGB 7.4* 6.9*  HCT 20.5* 19.1*    Assessment/Plan: Breastfeeding, continue magnesium for 24 hr total Anemia, follow for significant symptoms, replace irin   LOS: 3 days   ARNOLD,JAMES 04/02/2016, 7:32 AM

## 2016-04-02 NOTE — Lactation Note (Signed)
This note was copied from a baby's chart. Lactation Consultation Note  Patient Name: Boy Tawanna SatDanielle Fiallo ZOXWR'UToday's Date: 04/02/2016 Reason for consult: Initial assessment [facility] is 14 hours old & [facility]. Baby was born at 242w5d and weighed 6 lbs 11.1 oz at birth. Per flowsheet, mom has been feeding baby formula but has a feeding choice of breast & formula. [facility] and mom was eating. Mom states she wants to BF but she has felt very weak (per nurse, her hgb was 6.9 g/dL at [id number] this morning, which mom has started iron & mom is on Mag). Mom reports that her nurse earlier had attempted to latch baby & showed her how to hand express but baby was too sleepy to BF (this is not recorded in flowsheet & mom unsure of time). Encouraged mom to BF as soon as she feels able to help her body produce more milk & to ask for her nurse or LC to come assist with first feeding. Also encouraged skin-to-skin as able. Provided mom with BF booklet, BF resources & feeding log; mom made aware of O/P services, breastfeeding support groups, community resources, and our phone # for post-discharge questions. Mom reports no questions at this time.  Maternal Data Has patient been taught Hand Expression?: Yes (per pt, nurse helped her ) Does the patient have breastfeeding experience prior to this delivery?: No  Feeding    LATCH Score/Interventions                      Lactation Tools Discussed/Used WIC Program: Yes   Consult Status Consult Status: Follow-up Date: 04/03/16 Follow-up type: In-patient    Oneal GroutLaura C Celisse Ciulla 04/02/2016, 11:32 AM

## 2016-04-02 NOTE — Anesthesia Postprocedure Evaluation (Signed)
Anesthesia Post Note  Patient: Carrie Cardenas  Procedure(s) Performed: * No procedures listed *  Patient location during evaluation: Mother Baby Anesthesia Type: Epidural Level of consciousness: awake and alert Pain management: satisfactory to patient Vital Signs Assessment: post-procedure vital signs reviewed and stable Respiratory status: respiratory function stable Cardiovascular status: stable Postop Assessment: no headache, no backache, epidural receding, patient able to bend at knees, no signs of nausea or vomiting and adequate PO intake Anesthetic complications: no     Last Vitals:  Vitals:   04/02/16 0506 04/02/16 0600  BP: 117/72 111/84  Pulse: 65 67  Resp: 16 18  Temp: 36.8 C     Last Pain:  Vitals:   04/02/16 0700  TempSrc:   PainSc: 0-No pain   Pain Goal:                 [name]

## 2016-04-02 NOTE — Progress Notes (Signed)
Hgb resulted 6.9 Dr Debroah LoopArnold called and informed.

## 2016-04-02 NOTE — Progress Notes (Signed)
MOB was referred for history of depression/anxiety. * Referral screened out by Clinical Social Worker because none of the following criteria appear to apply: ~ History of anxiety/depression during this pregnancy, or of post-partum depression. ~ Diagnosis of anxiety and/or depression within last 3 years OR * MOB's symptoms currently being treated with medication and/or therapy. Please contact the Clinical Social Worker if needs arise, or if MOB requests.  CSW completed chart review and no indication of MG concerns.   Blaine HamperAngel Boyd-Gilyard, MSW, LCSW Clinical Social Work [phone]

## 2016-04-03 NOTE — Plan of Care (Signed)
Problem: Education: Goal: Knowledge of [facility] Education information/materials will improve Outcome: Completed/Met Date Met: 04/03/16 Patient is aware of continued plan of care.  Problem: Safety: Goal: Ability to remain free from injury will improve Outcome: Completed/Met Date Met: 04/03/16 Tolerates ambulating in room well.  Problem: Health Behavior/Discharge Planning: Goal: Ability to manage health-related needs will improve Outcome: Completed/Met Date Met: 04/03/16 Magnesium drip discontinued and patient aware of signs and symptoms of preeclampsia.  Problem: Pain Managment: Goal: General experience of comfort will improve Outcome: Completed/Met Date Met: 04/03/16 Good pain control on po Motrin per patient.  Problem: Physical Regulation: Goal: Ability to maintain clinical measurements within normal limits will improve Outcome: Completed/Met Date Met: 04/03/16 Magnesium drip discontinued,bp stable,DTR's WNL. Goal: Will remain free from infection Outcome: Completed/Met Date Met: 04/03/16 VSS.no signs and symptoms of infection at this time.  Problem: Skin Integrity: Goal: Risk for impaired skin integrity will decrease Outcome: Completed/Met Date Met: 04/03/16 Patient is ambulatory.  Problem: Tissue Perfusion: Goal: Risk factors for ineffective tissue perfusion will decrease Outcome: Completed/Met Date Met: 04/03/16 VSS,No SOB or DOE.

## 2016-04-03 NOTE — Plan of Care (Signed)
Problem: Urinary Elimination: Goal: Ability to reestablish a normal urinary elimination pattern will improve Outcome: Completed/Met Date Met: 04/03/16 Voids without difficulty.

## 2016-04-03 NOTE — Progress Notes (Signed)
Post Partum Day 2 Subjective: Pt without complaints this Am. Denies any HA or visual changes. Tolerating diet. Bottle/breast feeding. Pain controlled.  Objective: Blood pressure 118/63, pulse 68, temperature 98.2 F (36.8 C), temperature source Oral, resp. rate 18, height 5\' 5"  (1.651 m), weight 261 lb (118.4 kg), last menstrual period 01/06/2016, SpO2 100 %, unknown if currently breastfeeding.  Physical Exam:  Lungs clear Heart RRR Abd soft + BS uterus firm, nl lochia Ext non tender  Recent Labs  04/02/16 0038 04/02/16 0559  HGB 7.4* 6.9*  HCT 20.5* 19.1*    Assessment/Plan: PPD # 2 TSVD Severe PEC  PPH secondary to above Anemia secondary to above  Pt has been off magnesium lees than 12 hrs. BP is stable. Will allow to increase activity today and monitor Bp. If remains stable possible d/c home later today or tomorrow. Continue with iron supplement   LOS: 4 days   Hermina StaggersMichael L Vihan Santagata 04/03/2016, 7:07 AM

## 2016-04-03 NOTE — Lactation Note (Signed)
This note was copied from a baby's chart. Lactation Consultation Note  Patient Name: Carrie Cardenas [id number]'s Date: 04/03/2016 Reason for consult: Follow-up assessment   Follow up with mom of 42 hour old infant. Infant is receiving bottles of formula. Mom reports she is tired and does not want to latch infant. She reported she was going to switch to formula, we discussed LEAD and benefits of BF, she then said she would like to pump. [facility] formula or a pump, generally not both. I advised [facility]. Mom declined latch assistance with latch at this time. Enc mom to BF 8-12 x a day and to formula feed infant 8 x a day.   Mom noted to have pendulous soft breasts, She reports she does not feel any breast changes today. Mom with history of Gastric Sleeve surgery and anemia.   Set up DEBP with instructions to pump every 2-3 hours for 15 minutes with DEBP on Initiate setting. Showed mom how to assemble, disassemble, clean pump parts and turn pump on. Mom was pumping when I left the room. Pump Loaner program discussed with mom as mom does not have a pump at home. Follow up tomorrow and prn.       Maternal Data Does the patient have breastfeeding experience prior to this delivery?: No  Feeding Feeding Type: Bottle Fed - Formula Nipple Type: Slow - flow  LATCH Score/Interventions                      Lactation Tools Discussed/Used WIC Program: Yes Pump Review: Milk Storage;Setup, frequency, and cleaning Initiated by:: Noralee StainSharon Hice, RN, IBCLC Date initiated:: 04/03/16   Consult Status Consult Status: Follow-up Date: 04/04/16 Follow-up type: In-patient    Silas FloodSharon S Hice 04/03/2016, 3:56 PM

## 2016-04-04 MED ORDER — DOCUSATE SODIUM 100 MG PO CAPS: 100.0000 mg | ORAL_CAPSULE | Freq: Two times a day (BID) | ORAL | 2 refills | Status: DC | PRN

## 2016-04-04 MED ORDER — IBUPROFEN 600 MG PO TABS: 600.0000 mg | ORAL_TABLET | Freq: Four times a day (QID) | ORAL | 3 refills | Status: DC

## 2016-04-04 NOTE — Lactation Note (Signed)
This note was copied from a baby's chart. Lactation Consultation Note  Patient Name: Carrie Cardenas  Mom states she has decided to exclusively formula feed.  She does not wish to pump breasts.   Maternal Data Formula Feeding for Exclusion: No Reason for exclusion: Mother's choice to formula feed on admision Has patient been taught Hand Expression?: Yes Does the patient have breastfeeding experience prior to this delivery?: No  Feeding    LATCH Score/Interventions                      Lactation Tools Discussed/Used     Consult Status      Huston FoleyMOULDEN, [name], 10:50 AM

## 2016-04-04 NOTE — Discharge Instructions (Signed)

## 2016-04-04 NOTE — Progress Notes (Signed)
Pt. Is discharged in the care of Aunnt. Downstairs per ambulatory with infant in arms.Understands all discharged instructions well Questions asked and answred needed for home use.. Stable. Understands all infant care.

## 2016-04-04 NOTE — Discharge Summary (Signed)
OB Discharge Summary     Patient Name: Carrie Cardenas DOB: 05/12/1991 MRN: [id number]  Date of admission: 03/30/2016 Delivering MD: Willadean CarolMAYO, KATY DODD   Date of discharge: 04/04/2016  Admitting diagnosis: 38.1w pelvic pressure, feet swelling preeclampsia Intrauterine pregnancy: 147w5d     Secondary diagnosis:  Active Problems:   Gestational hypertension, antepartum   Severe preeclampsia, third trimester      Discharge diagnosis: Term Pregnancy Delivered  preeclampsia                                                                                              Post partum procedures:seizure prophylaxis with magnesium sulfate x 24 hours  Complications: None  Hospital course:  Induction of Labor With Vaginal Delivery   25 y.o. yo G1P1001 at 8647w5d was admitted to the hospital 03/30/2016 for induction of labor.  Indication for induction: Preeclampsia.  Patient had an uncomplicated labor course as follows: Membrane Rupture Time/Date: 6:59 AM ,04/01/2016   Intrapartum Procedures: Episiotomy: None [1]                                         Lacerations:  1st degree [2]  Patient had delivery of a Viable infant.  Information for the patient's newborn:  Carrie Cardenas, Boy Carrie Cardenas [[id number]][[id number]]  Delivery Method: Vag-Spont   04/01/2016  Details of delivery can be found in separate delivery note.  Patient had a routine postpartum course with seizure prophylaxis with magnesium sulfate x 24 hours post delivery. Patient is discharged home 04/04/16 pending social work consult   Physical exam  Vitals:   04/03/16 1817 04/03/16 2103 04/04/16 0126 04/04/16 0509  BP: 139/82 (!) 105/47 140/72 121/63  Pulse: 96 100 (!) 105 92  Resp: 18 18 18 18   Temp: 100.1 F (37.8 C) 99.1 F (37.3 C) 99.3 F (37.4 C) 98.6 F (37 C)  TempSrc: Oral Oral Oral Oral  SpO2: 99% 100% 100% 96%  Weight:    117.8 kg (259 lb 12 oz)  Height:       General: alert, cooperative and no distress Lochia: appropriate Uterine  Fundus: firm DVT Evaluation: No evidence of DVT seen on physical exam. Labs: Lab Results  Component Value Date   WBC 16.9 (H) 04/02/2016   HGB 6.9 (LL) 04/02/2016   HCT 19.1 (L) 04/02/2016   MCV 82.7 04/02/2016   PLT 157 04/02/2016   CMP Latest Ref Rng & Units 03/31/2016  Glucose 65 - 99 mg/dL 85  BUN 6 - 20 mg/dL 10  Creatinine 0.860.44 - 5.781.00 mg/dL 4.690.59  Sodium 629135 - 528145 mmol/L 134(L)  Potassium 3.5 - 5.1 mmol/L 3.5  Chloride 101 - 111 mmol/L 106  CO2 22 - 32 mmol/L 21(L)  Calcium 8.9 - 10.3 mg/dL 8.0(L)  Total Protein 6.5 - 8.1 g/dL 6.1(L)  Total Bilirubin 0.3 - 1.2 mg/dL 0.5  Alkaline Phos 38 - 126 U/L 140(H)  AST 15 - 41 U/L 39  ALT 14 - 54 U/L 53    Discharge instruction: per After Visit  Summary and "Baby and Me Booklet".  After visit meds:    Medication List    TAKE these medications   acetaminophen 500 MG tablet Commonly known as:  TYLENOL Take 1,000 mg by mouth every 6 (six) hours as needed for mild pain or headache.   docusate sodium 100 MG capsule Commonly known as:  COLACE Take 1 capsule (100 mg total) by mouth 2 (two) times daily as needed.   ferrous sulfate 325 (65 FE) MG tablet Take 1 tablet (325 mg total) by mouth daily.   ibuprofen 600 MG tablet Commonly known as:  ADVIL,MOTRIN Take 1 tablet (600 mg total) by mouth every 6 (six) hours.   prenatal multivitamin Tabs tablet Take 1 tablet by mouth daily at 12 noon.       Diet: routine diet  Activity: Advance as tolerated. Pelvic rest for 6 weeks.   Outpatient follow up:4-6 weeks Follow up Appt: Future Appointments Date Time Provider Department Center  05/11/2016 1:40 PM [facility] [place], DO [facility]   Follow up Visit:No Follow-up on file.  Postpartum contraception: Undecided  Newborn Data: Live born female  Birth Weight: 6 lb 11.1 oz (3035 g) APGAR: 7, 9  Baby Feeding: Bottle and Breast Disposition:home with mother   04/04/2016 Catalina Antigua, MD

## 2016-04-04 NOTE — Clinical Social Work Maternal (Signed)
CLINICAL SOCIAL WORK MATERNAL/CHILD NOTE  Patient Details  Name: Carrie Cardenas MRN: [id number] Date of Birth: [birth year]  Date:  04/04/2016  Clinical Social Worker Initiating Note:  Laurey Arrow Date/ Time Initiated:  04/04/16/1301     Child's Name:  Carrie Cardenas   Legal Guardian:  Mother   Need for Interpreter:  None   Date of Referral:  04/04/16     Reason for Referral:  [place], including SI    Referral Source:  [facility]   Address:  [place] [address]  Phone number:  [phone]   Household Members:  Self, Relatives   Natural Supports (not living in the home):  Extended Family, Immediate Family   Professional Supports:  (referral made to [employer])   Employment: Unemployed   Type of Work:     Education:  Database administrator Resources:  [employer]   Other Resources:  [employer], Physicist, medical    Cultural/Religious Considerations Which May Impact Care:  Per [employer], MOB is [employer].   Strengths:  Ability to meet basic needs , Pediatrician chosen , Home prepared for child    Risk Factors/Current Problems:  Mental Health Concerns    Cognitive State:  Able to Concentrate , Linear Thinking    Mood/Affect:  Flat , Tearful , Interested , Relaxed    CSW Assessment: CSW met with MOB to complete an assessment for MH hx.  MOB was polite and inviting.  MOB was engaged during the assessment, however, MOB demonstrated little to no eye contact with CSW. MOB was attentive and appropriate with the infant during the assessment. MOB appeared attached and bonded with the infant as evident by MOB engaging in infant massages and feeding infant. CSW inquired about MOB's MH hx and MOB acknowledged a dx of MDD.  MOB reported that MOB was bullied and pick on in school due to MOB's obesity.  MOB communicated that MOB was on medication, however since MOB gastric surgery, MOB symptoms and signs of depression  decreased. When CSW inquired about MOB's supports, MOB tearfully stated MOB's maternal grandmother passed in September 2017, and MOB has had a difficult time adjusting. MOB has lived with MOB's grandmother, since 62 mother passed unexpectedly in 2011. CSW validated and normalized MOB's thoughts and feelings. MOB is currently residing with MOB's aunt, whom has been supportive.  MOB communicated that MOB does not have a relationship with FOB Charlynne Cousins) and FOB was not aware that MOB was pregnant.  MOB informed CSW that MOB met FOB via online dating and has attempted to contact FOB via telephone and has not been successful. CSW educated MOB about PPD.  CSW informed MOB of possible supports and interventions to decrease PPD and reviewed supports for MOB. CSW also encouraged MOB to seek medical attention if needed for increased signs and symptoms for PPD. CSW asked several questions about PPD and became tearful again.  MOB communicated that MOB was interested in a medication regiment to assist MOB with MOB's recent loss of MOB's grandmother and feelings of sadness.  CSW informed MOB that CSW would consult with MOB's bedside nurse. CSW offered MOB resources for in-home counseling and parenting education and MOB embraced the resource.  CSW will make a referral for the Healthy Start Program.  CSW reviewed safe sleep and SIDS information and MOB appeared knowledgeable.  MOB responded to CSW questions appropriately and asked appropriate questions.  CSW provided MOB with information to add infant to MOB's  WIC, Food Stamps, and Medicaid applications.  CSW thanked MOB for meeting with CSW and provided MOB with CSW contact information.    CSW spoke with MOB's bedside nurse about MOB's request for medication and bedside nurse said she will inform MOB's provider.  CSW made a referral to the Healthy Start Program  CSW Plan/Description:  Information/Referral to [employer] , [employer] , No  Further Intervention Required/No Barriers to Discharge   Laurey Arrow, MSW, LCSW Clinical Social Work [phone]   Dimple Nanas, LCSW 04/04/2016, 1:05 PM

## 2016-04-05 ENCOUNTER — Inpatient Hospital Stay (HOSPITAL_COMMUNITY)
Admission: AD | Admit: 2016-04-05 | Discharge: 2016-04-13 | DRG: 769 | Disposition: A | Discharge status: Home / Self Care | Payer: Medicaid Other | Source: Ambulatory Visit | Attending: Internal Medicine | Admitting: Internal Medicine

## 2016-04-05 ENCOUNTER — Inpatient Hospital Stay (HOSPITAL_COMMUNITY): Payer: Medicaid Other

## 2016-04-05 ENCOUNTER — Encounter (HOSPITAL_COMMUNITY)
Admission: AD | Disposition: A | Discharge status: Home / Self Care | Payer: Self-pay | Source: Ambulatory Visit | Attending: Family Medicine

## 2016-04-05 ENCOUNTER — Inpatient Hospital Stay (HOSPITAL_COMMUNITY): Payer: Medicaid Other | Admitting: Anesthesiology

## 2016-04-05 ENCOUNTER — Encounter (HOSPITAL_COMMUNITY): Payer: Self-pay | Admitting: *Deleted

## 2016-04-05 DIAGNOSIS — A419 Sepsis, unspecified organism: Secondary | ICD-10-CM | POA: Diagnosis not present

## 2016-04-05 DIAGNOSIS — O1495 Unspecified pre-eclampsia, complicating the puerperium: Secondary | ICD-10-CM | POA: Diagnosis not present

## 2016-04-05 DIAGNOSIS — R58 Hemorrhage, not elsewhere classified: Secondary | ICD-10-CM

## 2016-04-05 DIAGNOSIS — R571 Hypovolemic shock: Secondary | ICD-10-CM | POA: Diagnosis not present

## 2016-04-05 DIAGNOSIS — O99214 Obesity complicating childbirth: Secondary | ICD-10-CM | POA: Diagnosis present

## 2016-04-05 DIAGNOSIS — K922 Gastrointestinal hemorrhage, unspecified: Secondary | ICD-10-CM

## 2016-04-05 DIAGNOSIS — Z9884 Bariatric surgery status: Secondary | ICD-10-CM | POA: Diagnosis not present

## 2016-04-05 DIAGNOSIS — D62 Acute posthemorrhagic anemia: Secondary | ICD-10-CM | POA: Diagnosis present

## 2016-04-05 DIAGNOSIS — J9690 Respiratory failure, unspecified, unspecified whether with hypoxia or hypercapnia: Secondary | ICD-10-CM

## 2016-04-05 DIAGNOSIS — R51 Headache: Secondary | ICD-10-CM | POA: Diagnosis present

## 2016-04-05 DIAGNOSIS — J9601 Acute respiratory failure with hypoxia: Secondary | ICD-10-CM | POA: Diagnosis not present

## 2016-04-05 DIAGNOSIS — T148XXA Other injury of unspecified body region, initial encounter: Secondary | ICD-10-CM

## 2016-04-05 DIAGNOSIS — Z6841 Body Mass Index (BMI) 40.0 and over, adult: Secondary | ICD-10-CM

## 2016-04-05 DIAGNOSIS — I272 Pulmonary hypertension, unspecified: Secondary | ICD-10-CM | POA: Diagnosis present

## 2016-04-05 DIAGNOSIS — D500 Iron deficiency anemia secondary to blood loss (chronic): Secondary | ICD-10-CM | POA: Diagnosis not present

## 2016-04-05 DIAGNOSIS — K226 Gastro-esophageal laceration-hemorrhage syndrome: Secondary | ICD-10-CM | POA: Diagnosis not present

## 2016-04-05 DIAGNOSIS — E876 Hypokalemia: Secondary | ICD-10-CM | POA: Diagnosis not present

## 2016-04-05 DIAGNOSIS — N179 Acute kidney failure, unspecified: Secondary | ICD-10-CM | POA: Diagnosis not present

## 2016-04-05 DIAGNOSIS — Z9889 Other specified postprocedural states: Secondary | ICD-10-CM | POA: Diagnosis not present

## 2016-04-05 DIAGNOSIS — Z452 Encounter for adjustment and management of vascular access device: Secondary | ICD-10-CM

## 2016-04-05 DIAGNOSIS — O165 Unspecified maternal hypertension, complicating the puerperium: Secondary | ICD-10-CM

## 2016-04-05 HISTORY — PX: DILATION AND EVACUATION: SHX1459

## 2016-04-05 LAB — CBC
HCT: 21.1 % — ABNORMAL LOW (ref 36.0–46.0)
HEMATOCRIT: 13.7 % — AB (ref 36.0–46.0)
HEMATOCRIT: 12.3 % — AB (ref 36.0–46.0)
HEMOGLOBIN: 4.4 g/dL — AB (ref 12.0–15.0)
HEMOGLOBIN: 3.9 g/dL — AB (ref 12.0–15.0)
HEMOGLOBIN: 7.4 g/dL — AB (ref 12.0–15.0)
MCH: 28.8 pg (ref 26.0–34.0)
MCH: 28.7 pg (ref 26.0–34.0)
MCH: 30.1 pg (ref 26.0–34.0)
MCHC: 32.1 g/dL (ref 30.0–36.0)
MCHC: 31.7 g/dL (ref 30.0–36.0)
MCHC: 35.1 g/dL (ref 30.0–36.0)
MCV: 89.5 fL (ref 78.0–100.0)
MCV: 90.4 fL (ref 78.0–100.0)
MCV: 85.8 fL (ref 78.0–100.0)
Platelets: 155 10*3/uL (ref 150–400)
Platelets: 147 10*3/uL — ABNORMAL LOW (ref 150–400)
Platelets: 150 10*3/uL (ref 150–400)
RBC: 1.53 MIL/uL — ABNORMAL LOW (ref 3.87–5.11)
RBC: 1.36 MIL/uL — ABNORMAL LOW (ref 3.87–5.11)
RBC: 2.46 MIL/uL — AB (ref 3.87–5.11)
RDW: 14.5 % (ref 11.5–15.5)
RDW: 14.6 % (ref 11.5–15.5)
RDW: 14.6 % (ref 11.5–15.5)
WBC: 8.8 10*3/uL (ref 4.0–10.5)
WBC: 8.8 10*3/uL (ref 4.0–10.5)
WBC: 12.7 10*3/uL — ABNORMAL HIGH (ref 4.0–10.5)

## 2016-04-05 LAB — COMPREHENSIVE METABOLIC PANEL
ALK PHOS: 85 U/L (ref 38–126)
ALT: 45 U/L (ref 14–54)
ANION GAP: 6 (ref 5–15)
AST: 42 U/L — ABNORMAL HIGH (ref 15–41)
Albumin: 2.7 g/dL — ABNORMAL LOW (ref 3.5–5.0)
BILIRUBIN TOTAL: 0.6 mg/dL (ref 0.3–1.2)
BUN: 9 mg/dL (ref 6–20)
CALCIUM: 8.6 mg/dL — AB (ref 8.9–10.3)
CO2: 25 mmol/L (ref 22–32)
CREATININE: 0.52 mg/dL (ref 0.44–1.00)
Chloride: 108 mmol/L (ref 101–111)
Glucose, Bld: 87 mg/dL (ref 65–99)
Potassium: 4.4 mmol/L (ref 3.5–5.1)
Sodium: 139 mmol/L (ref 135–145)
TOTAL PROTEIN: 5.5 g/dL — AB (ref 6.5–8.1)

## 2016-04-05 LAB — MASSIVE TRANSFUSION PROTOCOL ORDER (BLOOD BANK NOTIFICATION)

## 2016-04-05 LAB — DIC (DISSEMINATED INTRAVASCULAR COAGULATION) PANEL
APTT: 29 s (ref 24–36)
FIBRINOGEN: 306 mg/dL (ref 210–475)
INR: 1.07
PROTHROMBIN TIME: 14 s (ref 11.4–15.2)
SMEAR REVIEW: NONE SEEN

## 2016-04-05 LAB — DIC (DISSEMINATED INTRAVASCULAR COAGULATION)PANEL
D-Dimer, Quant: 4.9 ug/mL-FEU — ABNORMAL HIGH (ref 0.00–0.50)
Platelets: 147 10*3/uL — ABNORMAL LOW (ref 150–400)

## 2016-04-05 LAB — PREPARE RBC (CROSSMATCH)

## 2016-04-05 LAB — HEMOGLOBIN AND HEMATOCRIT, BLOOD
HCT: 25.2 % — ABNORMAL LOW (ref 36.0–46.0)
HEMOGLOBIN: 8.5 g/dL — AB (ref 12.0–15.0)

## 2016-04-05 LAB — PROTEIN / CREATININE RATIO, URINE
Creatinine, Urine: 93 mg/dL
PROTEIN CREATININE RATIO: 0.1 mg/mg{creat} (ref 0.00–0.15)
TOTAL PROTEIN, URINE: 9 mg/dL

## 2016-04-05 SURGERY — DILATION AND EVACUATION, UTERUS
Anesthesia: Choice

## 2016-04-05 MED ORDER — SODIUM CHLORIDE 0.9 % IV SOLN
Freq: Once | INTRAVENOUS | Status: AC
  Administered 2016-04-05: 12:00:00 via INTRAVENOUS

## 2016-04-05 MED ORDER — ROCURONIUM BROMIDE 100 MG/10ML IV SOLN
INTRAVENOUS | Status: DC | PRN
  Administered 2016-04-05: 20 mg via INTRAVENOUS

## 2016-04-05 MED ORDER — FAMOTIDINE IN NACL 20-0.9 MG/50ML-% IV SOLN
INTRAVENOUS | Status: AC
  Filled 2016-04-05: qty 50

## 2016-04-05 MED ORDER — OXYTOCIN 40 UNITS IN LACTATED RINGERS INFUSION - SIMPLE MED
INTRAVENOUS | Status: AC
  Filled 2016-04-05: qty 1000

## 2016-04-05 MED ORDER — AZITHROMYCIN 250 MG PO TABS
1000.0000 mg | ORAL_TABLET | Freq: Once | ORAL | Status: AC
  Administered 2016-04-05: 1000 mg via ORAL
  Filled 2016-04-05: qty 4

## 2016-04-05 MED ORDER — FENTANYL CITRATE (PF) 100 MCG/2ML IJ SOLN
INTRAMUSCULAR | Status: AC
  Filled 2016-04-05: qty 2

## 2016-04-05 MED ORDER — SENNOSIDES-DOCUSATE SODIUM 8.6-50 MG PO TABS
1.0000 | ORAL_TABLET | Freq: Every evening | ORAL | Status: DC | PRN
  Filled 2016-04-05: qty 1

## 2016-04-05 MED ORDER — PIPERACILLIN-TAZOBACTAM 3.375 G IVPB 30 MIN
3.3750 g | Freq: Three times a day (TID) | INTRAVENOUS | Status: DC
  Administered 2016-04-05: 3.375 g via INTRAVENOUS
  Filled 2016-04-05 (×5): qty 50

## 2016-04-05 MED ORDER — ZOLPIDEM TARTRATE 5 MG PO TABS: 5.0000 mg | ORAL_TABLET | Freq: Every evening | ORAL | Status: DC | PRN

## 2016-04-05 MED ORDER — OXYCODONE-ACETAMINOPHEN 5-325 MG PO TABS: 1.0000 | ORAL_TABLET | ORAL | Status: DC | PRN

## 2016-04-05 MED ORDER — ALUM & MAG HYDROXIDE-SIMETH 200-200-20 MG/5ML PO SUSP
30.0000 mL | ORAL | Status: DC | PRN
  Filled 2016-04-05: qty 30

## 2016-04-05 MED ORDER — LACTATED RINGERS IV SOLN
INTRAVENOUS | Status: DC | PRN
  Administered 2016-04-05: 12:00:00 via INTRAVENOUS

## 2016-04-05 MED ORDER — ACETAMINOPHEN 500 MG PO TABS
1000.0000 mg | ORAL_TABLET | Freq: Four times a day (QID) | ORAL | Status: DC | PRN
  Administered 2016-04-05: 1000 mg via ORAL
  Filled 2016-04-05: qty 2

## 2016-04-05 MED ORDER — HYDROMORPHONE HCL 1 MG/ML IJ SOLN: 0.2000 mg | INTRAMUSCULAR | Status: DC | PRN

## 2016-04-05 MED ORDER — METHYLERGONOVINE MALEATE 0.2 MG/ML IJ SOLN
0.2000 mg | Freq: Once | INTRAMUSCULAR | Status: AC
Start: 2016-04-05 — End: 2016-04-05
  Administered 2016-04-05: 0.2 mg via INTRAMUSCULAR

## 2016-04-05 MED ORDER — ACETAMINOPHEN 325 MG PO TABS
650.0000 mg | ORAL_TABLET | Freq: Once | ORAL | Status: DC
Start: 2016-04-05 — End: 2016-04-05

## 2016-04-05 MED ORDER — SOD CITRATE-CITRIC ACID 500-334 MG/5ML PO SOLN: 30.0000 mL | Freq: Once | ORAL | Status: DC

## 2016-04-05 MED ORDER — LABETALOL HCL 5 MG/ML IV SOLN: 20.0000 mg | INTRAVENOUS | Status: DC | PRN

## 2016-04-05 MED ORDER — FUROSEMIDE 10 MG/ML IJ SOLN
20.0000 mg | Freq: Once | INTRAMUSCULAR | Status: AC
  Administered 2016-04-05: 20 mg via INTRAVENOUS

## 2016-04-05 MED ORDER — ESMOLOL HCL 100 MG/10ML IV SOLN
INTRAVENOUS | Status: AC
  Filled 2016-04-05: qty 10

## 2016-04-05 MED ORDER — HYDRALAZINE HCL 20 MG/ML IJ SOLN: 5.0000 mg | INTRAMUSCULAR | Status: DC | PRN

## 2016-04-05 MED ORDER — CYCLOBENZAPRINE HCL 10 MG PO TABS
10.0000 mg | ORAL_TABLET | Freq: Once | ORAL | Status: AC
Start: 2016-04-05 — End: 2016-04-05
  Administered 2016-04-05: 10 mg via ORAL
  Filled 2016-04-05: qty 1

## 2016-04-05 MED ORDER — ETOMIDATE 2 MG/ML IV SOLN
INTRAVENOUS | Status: AC
  Filled 2016-04-05: qty 10

## 2016-04-05 MED ORDER — FENTANYL CITRATE (PF) 100 MCG/2ML IJ SOLN
INTRAMUSCULAR | Status: DC | PRN
  Administered 2016-04-05: 50 ug via INTRAVENOUS
  Administered 2016-04-05: 100 ug via INTRAVENOUS
  Administered 2016-04-05: 50 ug via INTRAVENOUS

## 2016-04-05 MED ORDER — FENTANYL CITRATE (PF) 100 MCG/2ML IJ SOLN: 25.0000 ug | INTRAMUSCULAR | Status: DC | PRN

## 2016-04-05 MED ORDER — ACETAMINOPHEN 500 MG PO TABS: 1000.0000 mg | ORAL_TABLET | Freq: Four times a day (QID) | ORAL | Status: DC | PRN

## 2016-04-05 MED ORDER — MIDAZOLAM HCL 2 MG/2ML IJ SOLN
INTRAMUSCULAR | Status: AC
  Filled 2016-04-05: qty 2

## 2016-04-05 MED ORDER — DOXYCYCLINE HYCLATE 100 MG IV SOLR
200.0000 mg | Freq: Once | INTRAVENOUS | Status: AC
  Administered 2016-04-05: 200 mg via INTRAVENOUS
  Filled 2016-04-05: qty 200

## 2016-04-05 MED ORDER — SIMETHICONE 80 MG PO CHEW
80.0000 mg | CHEWABLE_TABLET | Freq: Four times a day (QID) | ORAL | Status: DC | PRN
  Filled 2016-04-05: qty 1

## 2016-04-05 MED ORDER — LACTATED RINGERS IV SOLN
INTRAVENOUS | Status: DC
  Administered 2016-04-05 – 2016-04-06 (×2): via INTRAVENOUS

## 2016-04-05 MED ORDER — ETOMIDATE 2 MG/ML IV SOLN
INTRAVENOUS | Status: DC | PRN
  Administered 2016-04-05: 20 mg via INTRAVENOUS

## 2016-04-05 MED ORDER — PIPERACILLIN-TAZOBACTAM 3.375 G IVPB
3.3750 g | Freq: Three times a day (TID) | INTRAVENOUS | Status: DC
  Administered 2016-04-06 – 2016-04-07 (×5): 3.375 g via INTRAVENOUS
  Filled 2016-04-05 (×5): qty 50

## 2016-04-05 MED ORDER — MENTHOL 3 MG MT LOZG: 1.0000 | LOZENGE | OROMUCOSAL | Status: DC | PRN

## 2016-04-05 MED ORDER — SUCCINYLCHOLINE CHLORIDE 200 MG/10ML IV SOSY
PREFILLED_SYRINGE | INTRAVENOUS | Status: AC
  Filled 2016-04-05: qty 10

## 2016-04-05 MED ORDER — ONDANSETRON HCL 4 MG PO TABS: 4.0000 mg | ORAL_TABLET | Freq: Four times a day (QID) | ORAL | Status: DC | PRN

## 2016-04-05 MED ORDER — PROPOFOL 10 MG/ML IV BOLUS
INTRAVENOUS | Status: AC
  Filled 2016-04-05: qty 20

## 2016-04-05 MED ORDER — MISOPROSTOL 200 MCG PO TABS
ORAL_TABLET | ORAL | Status: AC
  Filled 2016-04-05: qty 4

## 2016-04-05 MED ORDER — BISACODYL 10 MG RE SUPP: 10.0000 mg | Freq: Every day | RECTAL | Status: DC | PRN

## 2016-04-05 MED ORDER — OXYTOCIN 40 UNITS IN LACTATED RINGERS INFUSION - SIMPLE MED
40.0000 [IU] | INTRAVENOUS | Status: DC
  Administered 2016-04-05: 40 m[IU]/min via INTRAVENOUS
  Administered 2016-04-05: 40 mL via INTRAVENOUS

## 2016-04-05 MED ORDER — FUROSEMIDE 10 MG/ML IJ SOLN
20.0000 mg | Freq: Once | INTRAMUSCULAR | Status: AC
  Administered 2016-04-05: 20 mg via INTRAVENOUS
  Filled 2016-04-05: qty 2

## 2016-04-05 MED ORDER — SUGAMMADEX SODIUM 200 MG/2ML IV SOLN
INTRAVENOUS | Status: DC | PRN
  Administered 2016-04-05: 200 mg via INTRAVENOUS

## 2016-04-05 MED ORDER — METHYLERGONOVINE MALEATE 0.2 MG/ML IJ SOLN
INTRAMUSCULAR | Status: AC
  Filled 2016-04-05: qty 1

## 2016-04-05 MED ORDER — LACTATED RINGERS IV SOLN: INTRAVENOUS | Status: DC | PRN

## 2016-04-05 MED ORDER — MISOPROSTOL 200 MCG PO TABS
800.0000 ug | ORAL_TABLET | Freq: Once | ORAL | Status: AC
  Administered 2016-04-05: 800 ug via BUCCAL

## 2016-04-05 MED ORDER — FAMOTIDINE IN NACL 20-0.9 MG/50ML-% IV SOLN
20.0000 mg | Freq: Once | INTRAVENOUS | Status: AC
  Administered 2016-04-05: 20 mg via INTRAVENOUS

## 2016-04-05 MED ORDER — PANTOPRAZOLE SODIUM 40 MG PO TBEC
40.0000 mg | DELAYED_RELEASE_TABLET | Freq: Every day | ORAL | Status: DC
  Administered 2016-04-06: 40 mg via ORAL
  Filled 2016-04-05: qty 1

## 2016-04-05 MED ORDER — MIDAZOLAM HCL 5 MG/5ML IJ SOLN
INTRAMUSCULAR | Status: DC | PRN
  Administered 2016-04-05 (×2): 1 mg via INTRAVENOUS

## 2016-04-05 MED ORDER — SODIUM CHLORIDE 0.9 % IV SOLN: Freq: Once | INTRAVENOUS | Status: DC

## 2016-04-05 MED ORDER — SOD CITRATE-CITRIC ACID 500-334 MG/5ML PO SOLN
ORAL | Status: AC
  Filled 2016-04-05: qty 15

## 2016-04-05 MED ORDER — PRENATAL MULTIVITAMIN CH
1.0000 | ORAL_TABLET | Freq: Every day | ORAL | Status: DC
  Filled 2016-04-05 (×2): qty 1

## 2016-04-05 MED ORDER — LIDOCAINE HCL (CARDIAC) 20 MG/ML IV SOLN
INTRAVENOUS | Status: AC
  Filled 2016-04-05: qty 5

## 2016-04-05 MED ORDER — FUROSEMIDE 10 MG/ML IJ SOLN
INTRAMUSCULAR | Status: AC
  Filled 2016-04-05: qty 2

## 2016-04-05 MED ORDER — DOCUSATE SODIUM 100 MG PO CAPS
100.0000 mg | ORAL_CAPSULE | Freq: Two times a day (BID) | ORAL | Status: DC
  Administered 2016-04-05 – 2016-04-06 (×2): 100 mg via ORAL
  Filled 2016-04-05 (×3): qty 1

## 2016-04-05 MED ORDER — DIPHENHYDRAMINE HCL 25 MG PO CAPS: 25.0000 mg | ORAL_CAPSULE | Freq: Once | ORAL | Status: DC

## 2016-04-05 MED ORDER — ONDANSETRON HCL 4 MG/2ML IJ SOLN
INTRAMUSCULAR | Status: DC | PRN
  Administered 2016-04-05: 4 mg via INTRAVENOUS

## 2016-04-05 MED ORDER — ONDANSETRON HCL 4 MG/2ML IJ SOLN
4.0000 mg | Freq: Four times a day (QID) | INTRAMUSCULAR | Status: DC | PRN
  Administered 2016-04-07 – 2016-04-08 (×2): 4 mg via INTRAVENOUS
  Filled 2016-04-05 (×3): qty 2

## 2016-04-05 MED ORDER — SUCCINYLCHOLINE CHLORIDE 20 MG/ML IJ SOLN
INTRAMUSCULAR | Status: DC | PRN
  Administered 2016-04-05: 120 mg via INTRAVENOUS

## 2016-04-05 MED ORDER — MAGNESIUM CITRATE PO SOLN: 1.0000 | Freq: Once | ORAL | Status: DC | PRN

## 2016-04-05 MED ORDER — PROMETHAZINE HCL 25 MG/ML IJ SOLN: 6.2500 mg | INTRAMUSCULAR | Status: DC | PRN

## 2016-04-05 MED ORDER — METHYLERGONOVINE MALEATE 0.2 MG/ML IJ SOLN
INTRAMUSCULAR | Status: DC | PRN
  Administered 2016-04-05: 0.2 mg via INTRAMUSCULAR

## 2016-04-05 SURGICAL SUPPLY — 24 items
ADAPTER VACURETTE TBG SET 14 (CANNULA) ×2 IMPLANT
BALLN POSTPARTUM SOS BAKRI (BALLOONS) ×3
BALLOON POSTPARTUM SOS BAKRI (BALLOONS) IMPLANT
CATH ROBINSON RED A/P 16FR (CATHETERS) ×3 IMPLANT
CLOTH BEACON ORANGE TIMEOUT ST (SAFETY) ×3 IMPLANT
DECANTER SPIKE VIAL GLASS SM (MISCELLANEOUS) ×3 IMPLANT
GLOVE BIOGEL PI IND STRL 7.0 (GLOVE) ×3 IMPLANT
GLOVE BIOGEL PI INDICATOR 7.0 (GLOVE) ×6
GLOVE ECLIPSE 7.0 STRL STRAW (GLOVE) ×3 IMPLANT
GOWN STRL REUS W/TWL LRG LVL3 (GOWN DISPOSABLE) ×8 IMPLANT
KIT BERKELEY 1ST TRIMESTER 3/8 (MISCELLANEOUS) ×3 IMPLANT
NS IRRIG 1000ML POUR BTL (IV SOLUTION) ×3 IMPLANT
PACK VAGINAL MINOR WOMEN LF (CUSTOM PROCEDURE TRAY) ×3 IMPLANT
PAD OB MATERNITY 4.3X12.25 (PERSONAL CARE ITEMS) ×3 IMPLANT
PAD PREP 24X48 CUFFED NSTRL (MISCELLANEOUS) ×3 IMPLANT
SET BERKELEY SUCTION TUBING (SUCTIONS) ×3 IMPLANT
SYRINGE 60CC LL (MISCELLANEOUS) ×2 IMPLANT
TOWEL OR 17X24 6PK STRL BLUE (TOWEL DISPOSABLE) ×6 IMPLANT
VACURETTE 10 RIGID CVD (CANNULA) IMPLANT
VACURETTE 14MM CVD 1/2 BASE (CANNULA) ×2 IMPLANT
VACURETTE 6 ASPIR F TIP BERK (CANNULA) IMPLANT
VACURETTE 7MM CVD STRL WRAP (CANNULA) IMPLANT
VACURETTE 8 RIGID CVD (CANNULA) IMPLANT
VACURETTE 9 RIGID CVD (CANNULA) IMPLANT

## 2016-04-05 NOTE — MAU Note (Signed)
Pt to OR via stretcher.

## 2016-04-05 NOTE — MAU Note (Signed)
Donette LarryMelanie Bhambri CNM notified of vaginal bleeding, orders received

## 2016-04-05 NOTE — Progress Notes (Signed)
CRITICAL VALUE ALERT  Critical value received: HBG 3.9  Date of notification: 04/05/16  Time of notification: 1212  Critical value read back:yes  Nurse who received alert:  Paysen Goza spurlock-frizzell   MD notified (1st page):  Anyanwu  Time of first page:  1212  MD notified (2nd page):  Time of second page:  Responding MD: Anyanwu  Time MD responded:  On unit when call received

## 2016-04-05 NOTE — Progress Notes (Addendum)
Patient is in PACU and is receiving first unit of FFP and has a fever of 101.3.  Doxycycline IV also going in.  Will complete transfusion of FFP unit, give Tylenol 1000 mg po x 1. Zosyn ordered to broaden coverage. Bakri output ~ 100 ml. Will continue close observation.  Tereso NewcomerUgonna A Orbin Mayeux, MD

## 2016-04-05 NOTE — Op Note (Signed)
Carrie Cardenas PROCEDURE DATE:  04/05/2016  PREOPERATIVE DIAGNOSES: Retained products of conception s/p SVD on 04/01/16; postpartum hemorrhage with anemia POSTOPERATIVE DIAGNOSES: The same PROCEDURE:   Dilation and Curettage under ultrasound guidance SURGEON:  Dr. Jaynie CollinsUgonna Anyanwu ASSISTANT: Dr. Ernestina PennaNicholas Schenk  INDICATIONS: 25 y.o.  G1P1001 with retained products of conception (RPOCs) and hemorrhage after SVD on 04/01/16, needing urgent surgical completion.  Risks of surgery were discussed with the patient and her friend including but not limited to: bleeding which may require transfusion; infection which may require antibiotics; injury to uterus or surrounding organs; need for additional procedures including laparotomy or hysterectomy; possibility of intrauterine scarring which may impair future fertility; and other postoperative/anesthesia complications. Written informed consent was obtained.  FINDINGS:  Significant amount of RPOCs with significant clots and blood.  Total EBL about 2 liters.    ANESTHESIA:    General INTRAVENOUS FLUIDS:  4 units of pRBCs, 2 units of FFP, 600 ml crystalloid ESTIMATED BLOOD LOSS:  Over 2000 ml since the beginning of the post delivery hemorrhage. SPECIMENS:  RPOC sent to pathology COMPLICATIONS:  None immediate.  PROCEDURE DETAILS:  The patient was then taken to the operating room where general anesthesia was administered and was found to be adequate.  After an adequate timeout was performed, she was placed in the dorsal lithotomy position and examined; then prepped and draped in the sterile manner.   A vaginal speculum was then placed in the patient's vagina and a ring forceps was applied to the anterior lip of the cervix.  The cervix was already about 1 cm dilated; a 14 mm suction curette was then advanced into the uterus, the suction device was then activated and curette slowly rotated to clear further placental fragments.  A sharp curettage was then performed  to confirm complete emptying of the uterus. No further products seen on ultrasound.  There was significant bleeding noted so IM Methergine was repeated; this did not help with the bleeding.  A Bakri balloon was placed and filled with 240 ml of water, and connected to a foley bag. Kerlix guaze was placed vaginally.  A urinary foley catheter was placed into the bladder. Bleeding was noted to subside with the Bakri balloon in place.   All instruments were removed from the patient's vagina.  Sponge and instrument counts were correct times two.  The patient tolerated the procedure well and was taken to the recovery area awake, extubated and in stable condition.   Jaynie CollinsUGONNA  ANYANWU, MD, FACOG Attending Obstetrician & Gynecologist Faculty Practice, [facility]

## 2016-04-05 NOTE — MAU Provider Note (Signed)
History     CSN: [id number]  Arrival date and time: 04/05/16 [id number]   First Provider Initiated Contact with Patient 04/05/16 [phone]      Chief Complaint  Patient presents with  . Headache  . Vaginal Bleeding   Postpartum s/p SVD 4 days ago here with c/o heavy vaginal bleeding when she got up around 0600 this am. She reports passing a fist sized clot. Prior to that bleeding was lite. She also c/o left temporal HA that radiates to the back. She reports seeing floaters since arrival to hospital. She denies epigastric pain. She is formula feeding. She was admitted for pre-e, underwent IOL, given Magnesium Sulfate, and d/c'd home on PPD #3.    OB History    Gravida Para Term Preterm AB Living   1 1 1  0 0 1   SAB TAB Ectopic Multiple Live Births   0 0 0 0 1      Past Medical History:  Diagnosis Date  . Depression    pt hospitalized at 25 years old for major depression    Past Surgical History:  Procedure Laterality Date  . GASTRIC BYPASS    . SLEEVE GASTROPLASTY  2015    Family History  Problem Relation Age of Onset  . Fibroids Mother   . Anemia Mother   . Pulmonary embolism Mother   . Cancer Maternal Grandmother     Social History  Substance Use Topics  . Smoking status: Never Smoker  . Smokeless tobacco: Never Used  . Alcohol use No    Allergies: No Known Allergies  Prescriptions Prior to Admission  Medication Sig Dispense Refill Last Dose  . acetaminophen (TYLENOL) 500 MG tablet Take 1,000 mg by mouth every 6 (six) hours as needed for mild pain or headache.   03/29/2016 at Unknown time  . docusate sodium (COLACE) 100 MG capsule Take 1 capsule (100 mg total) by mouth 2 (two) times daily as needed. 30 capsule 2   . ferrous sulfate 325 (65 FE) MG tablet Take 1 tablet (325 mg total) by mouth daily. 30 tablet 3 Past Month at Unknown time  . ibuprofen (ADVIL,MOTRIN) 600 MG tablet Take 1 tablet (600 mg total) by mouth every 6 (six) hours. 30 tablet 3   . Prenatal  Vit-Fe Fumarate-FA (PRENATAL MULTIVITAMIN) TABS tablet Take 1 tablet by mouth daily at 12 noon.   Past Month at Unknown time    Review of Systems  Constitutional: Negative.   Eyes: Negative for blurred vision and double vision.  Genitourinary: Negative.   Neurological: Positive for headaches.   Physical Exam   Blood pressure 143/94, pulse 97, temperature 99.2 F (37.3 C), temperature source Oral, resp. rate 18, last menstrual period 01/06/2016, SpO2 100 %, currently breastfeeding.  Physical Exam  Constitutional: She is oriented to person, place, and time. She appears well-developed and well-nourished.  HENT:  Head: Normocephalic and atraumatic.  Neck: Normal range of motion. Neck supple.  Cardiovascular: Normal rate and regular rhythm.   Respiratory: Effort normal and breath sounds normal.  GI: Soft. She exhibits no distension. There is no tenderness.  Genitourinary:  Genitourinary Comments: External: no lesions Vagina: rugated, moderate lochia rubra in vault, cervix FT, FF below U    Musculoskeletal: Normal range of motion.  Neurological: She is alert and oriented to person, place, and time.  Skin: Skin is warm and dry.  Psychiatric: She has a normal mood and affect.   Results for orders placed or performed during the hospital encounter  of 04/05/16 (from the past 24 hour(s))  Protein / creatinine ratio, urine     Status: None   Collection Time: 04/05/16  8:55 AM  Result Value Ref Range   Creatinine, Urine 93.00 mg/dL   Total Protein, Urine 9 mg/dL   Protein Creatinine Ratio 0.10 0.00 - 0.15 mg/mg[Cre]  CBC     Status: Abnormal   Collection Time: 04/05/16  9:06 AM  Result Value Ref Range   WBC 8.8 4.0 - 10.5 K/uL   RBC 1.53 (L) 3.87 - 5.11 MIL/uL   Hemoglobin 4.4 (LL) 12.0 - 15.0 g/dL   HCT 41.3 (L) 24.4 - 01.0 %   MCV 89.5 78.0 - 100.0 fL   MCH 28.8 26.0 - 34.0 pg   MCHC 32.1 30.0 - 36.0 g/dL   RDW 27.2 53.6 - 64.4 %   Platelets 155 150 - 400 K/uL  Comprehensive  metabolic panel     Status: Abnormal   Collection Time: 04/05/16  9:06 AM  Result Value Ref Range   Sodium 139 135 - 145 mmol/L   Potassium 4.4 3.5 - 5.1 mmol/L   Chloride 108 101 - 111 mmol/L   CO2 25 22 - 32 mmol/L   Glucose, Bld 87 65 - 99 mg/dL   BUN 9 6 - 20 mg/dL   Creatinine, Ser 0.34 0.44 - 1.00 mg/dL   Calcium 8.6 (L) 8.9 - 10.3 mg/dL   Total Protein 5.5 (L) 6.5 - 8.1 g/dL   Albumin 2.7 (L) 3.5 - 5.0 g/dL   AST 42 (H) 15 - 41 U/L   ALT 45 14 - 54 U/L   Alkaline Phosphatase 85 38 - 126 U/L   Total Bilirubin 0.6 0.3 - 1.2 mg/dL   GFR calc non Af Amer >60 >60 mL/min   GFR calc Af Amer >60 >60 mL/min   Anion gap 6 5 - 15    MAU Course  Procedures Tylenol 1 gm po x1  MDM Labs ordered and reviewed. Discussed presentation, clinical findings, and labs with Dr. Macon Large. Plan for admit and transfusion.  Assessment and Plan  Blood loss anemia Postpartum HA Worsening HTN  Admit Transfuse PRBCs Pre-e labs pending MD to manage  Donette Larry, CNM 04/05/2016, 8:36 AM

## 2016-04-05 NOTE — Addendum Note (Signed)
Addendum  created 04/05/16 2053 by Graciela HusbandsWynn O Kortez Murtagh, CRNA   Sign clinical note

## 2016-04-05 NOTE — MAU Note (Signed)
Woke up to feed baby around 4,  Noted a headache (did not take anything).  Was really hot in the hot, felt a little confused.  Went upstairs, aunt noted she was tracking blood.  Went back to sleep after she fed and and changed the baby.  Woke up felt wet.  Seemed watery,passed a large fist sized clot.  Blood noted on bottom of feet on arrival,  Blood noted on gown on arrival, did not have a pad on, was using liners- "they didn't give me any pads". Tried to breast feed, but is sticking to bottle, baby is doing fine. Denies visual changes or epigastric pain.  Swelling noted below knees.

## 2016-04-05 NOTE — Transfer of Care (Signed)
Immediate Anesthesia Transfer of Care Note  Patient: Carrie Cardenas  Procedure(s) Performed: Procedure(s): DILATATION AND EVACUATION (N/A)  Patient Location: PACU  Anesthesia Type:General  Level of Consciousness: awake, alert  and oriented  Airway & Oxygen Therapy: Patient Spontanous Breathing and Patient connected to face mask oxygen  Post-op Assessment: Report given to RN and Post -op Vital signs reviewed and stable  Post vital signs: Reviewed and stable  Last Vitals:  Vitals:   04/05/16 1133 04/05/16 1147  BP:  157/89  Pulse:  114  Resp:    Temp: 36.9 C     Last Pain:  Vitals:   04/05/16 0756  TempSrc: Oral  PainSc:          Complications: No apparent anesthesia complications

## 2016-04-05 NOTE — Anesthesia Procedure Notes (Signed)
Procedure Name: Intubation Date/Time: 04/05/2016 12:17 PM Performed by: Junious SilkGILBERT, Carlos Quackenbush Pre-anesthesia Checklist: Patient identified, Emergency Drugs available, Suction available, Patient being monitored and Timeout performed Patient Re-evaluated:Patient Re-evaluated prior to inductionOxygen Delivery Method: Circle system utilized Preoxygenation: Pre-oxygenation with 100% oxygen Intubation Type: IV induction, Cricoid Pressure applied and Rapid sequence Laryngoscope Size: Glidescope and 4 Grade View: Grade I Tube type: Oral Tube size: 7.0 mm Number of attempts: 1 Airway Equipment and Method: Stylet and Video-laryngoscopy Placement Confirmation: ETT inserted through vocal cords under direct vision,  positive ETCO2 and breath sounds checked- equal and bilateral Secured at: 22 cm Tube secured with: Tape Dental Injury: Teeth and Oropharynx as per pre-operative assessment

## 2016-04-05 NOTE — H&P (Signed)
OBSTETRIC ADMISSION HISTORY AND PHYSICAL  Carrie Cardenas is a 25 y.o. female G1P1001 postpartum s/p SVD 4 days being admitted for anemia, worsening HTN, and HA. She c/o heavy vaginal bleeding when she got up around 0600 this am and passed a fist sized clot. She also c/o left temporal HA that radiates to the back. She reports seeing floaters since arrival to hospital. She denies epigastric pain. She is formula feeding. She was previously admitted for pre-e, underwent IOL, given Magnesium Sulfate, and d/c'd home on PPD #3.  Prenatal History/Complications:  Past Medical History: Past Medical History:  Diagnosis Date  . Depression    pt hospitalized at 25 years old for major depression    Past Surgical History: Past Surgical History:  Procedure Laterality Date  . GASTRIC BYPASS    . SLEEVE GASTROPLASTY  2015    Obstetrical History: OB History    Gravida Para Term Preterm AB Living   0 0 1   SAB TAB Ectopic Multiple Live Births   0 0 0 0 1      Social History: Social History   Social History  . Marital status: Single    Spouse name: N/A  . Number of children: N/A  . Years of education: N/A   Social History Main Topics  . Smoking status: Never Smoker  . Smokeless tobacco: Never Used  . Alcohol use No  . Drug use: No  . Sexual activity: No   Other Topics Concern  . None   Social History Narrative  . None    Family History: Family History  Problem Relation Age of Onset  . Fibroids Mother   . Anemia Mother   . Pulmonary embolism Mother   . Cancer Maternal Grandmother     Allergies: No Known Allergies  Prescriptions Prior to Admission  Medication Sig Dispense Refill Last Dose  . acetaminophen (TYLENOL) 500 MG tablet Take 1,000 mg by mouth every 6 (six) hours as needed for mild pain or headache.   03/29/2016 at Unknown time  . docusate sodium (COLACE) 100 MG capsule Take 1 capsule (100 mg total) by mouth 2 (two) times daily as needed. 30 capsule 2   .  ferrous sulfate 325 (65 FE) MG tablet Take 1 tablet (325 mg total) by mouth daily. 30 tablet 3 Past Month at Unknown time  . ibuprofen (ADVIL,MOTRIN) 600 MG tablet Take 1 tablet (600 mg total) by mouth every 6 (six) hours. 30 tablet 3   . Prenatal Vit-Fe Fumarate-FA (PRENATAL MULTIVITAMIN) TABS tablet Take 1 tablet by mouth daily at 12 noon.   Past Month at Unknown time     Review of Systems   All systems reviewed and negative except as stated in HPI  Blood pressure 156/97, pulse 117, temperature 99.2 F (37.3 C), temperature source Oral, resp. rate 18, last menstrual period 01/06/2016, SpO2 100 %, currently breastfeeding. General appearance: alert, cooperative and no distress Lungs: clear to auscultation bilaterally Heart: regular rate and rhythm Abdomen: soft, non-tender; bowel sounds normal Pelvic: nml involution, cervix FT, moderate lochia rubra Extremities: Homans sign is negative, no sign of DVT, 2+ BLE edema  Results for orders placed or performed during the hospital encounter of 04/05/16 (from the past 24 hour(s))  Protein / creatinine ratio, urine   Collection Time: 04/05/16  8:55 AM  Result Value Ref Range   Creatinine, Urine 93.00 mg/dL   Total Protein, Urine 9 mg/dL   Protein Creatinine Ratio 0.10 0.00 - 0.15 mg/mg[Cre]  CBC   Collection Time: 04/05/16  9:06 AM  Result Value Ref Range   WBC 8.8 4.0 - 10.5 K/uL   RBC 1.53 (L) 3.87 - 5.11 MIL/uL   Hemoglobin 4.4 (LL) 12.0 - 15.0 g/dL   HCT 16.1 (L) 09.6 - 04.5 %   MCV 89.5 78.0 - 100.0 fL   MCH 28.8 26.0 - 34.0 pg   MCHC 32.1 30.0 - 36.0 g/dL   RDW 40.9 81.1 - 91.4 %   Platelets 155 150 - 400 K/uL  Comprehensive metabolic panel   Collection Time: 04/05/16  9:06 AM  Result Value Ref Range   Sodium 139 135 - 145 mmol/L   Potassium 4.4 3.5 - 5.1 mmol/L   Chloride 108 101 - 111 mmol/L   CO2 25 22 - 32 mmol/L   Glucose, Bld 87 65 - 99 mg/dL   BUN 9 6 - 20 mg/dL   Creatinine, Ser 7.82 0.44 - 1.00 mg/dL   Calcium  8.6 (L) 8.9 - 10.3 mg/dL   Total Protein 5.5 (L) 6.5 - 8.1 g/dL   Albumin 2.7 (L) 3.5 - 5.0 g/dL   AST 42 (H) 15 - 41 U/L   ALT 45 14 - 54 U/L   Alkaline Phosphatase 85 38 - 126 U/L   Total Bilirubin 0.6 0.3 - 1.2 mg/dL   GFR calc non Af Amer >60 >60 mL/min   GFR calc Af Amer >60 >60 mL/min   Anion gap 6 5 - 15    Patient Active Problem List   Diagnosis Date Noted  . Blood loss anemia 04/05/2016  . Gestational hypertension, antepartum 03/30/2016  . Severe preeclampsia, third trimester 03/30/2016  . GBS (group B Streptococcus carrier), +RV culture, currently pregnant 03/24/2016  . Polyhydramnios 02/08/2016  . LGSIL (low grade squamous intraepithelial dysplasia) 01/19/2016  . Leg swelling in pregnancy in second trimester 01/12/2016  . Genital warts complicating pregnancy 12/16/2015  . Supervision of normal pregnancy in third trimester 09/30/2015  . History of gastric restrictive surgery 09/30/2015    Assessment: Carrie Cardenas is a 25 y.o. G1P1001 postpartum SVD 4 days ago  1. Blood loss anemia 2. Postpartum HA 3. Worsening HTN   Plan: 1. Admit 2. Transfuse PRBCs 3. Pre-e labs pending  Donette Larry, CNM  04/05/2016, 10:49 AM

## 2016-04-05 NOTE — MAU Note (Signed)
HBG report from lab 4.4., provider Donette LarryMelanie Bhambri CNM notified

## 2016-04-05 NOTE — MAU Note (Signed)
Pt up to bathroom to void, pt called out and needed assistance, large amount of bleeding noted on bathroom floor and running down patient's legs, pt cleaned up and assisted back to bed

## 2016-04-05 NOTE — Addendum Note (Signed)
Addendum  created 04/05/16 1447 by Shanon PayorSuzanne M Roma Bierlein, CRNA   Charge Capture section accepted

## 2016-04-05 NOTE — Anesthesia Postprocedure Evaluation (Signed)
Anesthesia Post Note  Patient: Tawanna SatDanielle Lupinski  Procedure(s) Performed: Procedure(s) (LRB): DILATATION AND EVACUATION (N/A)  Patient location during evaluation: PACU Anesthesia Type: General Level of consciousness: awake and alert Pain management: pain level controlled Vital Signs Assessment: post-procedure vital signs reviewed and stable Respiratory status: spontaneous breathing, nonlabored ventilation, respiratory function stable and patient connected to nasal cannula oxygen Cardiovascular status: blood pressure returned to baseline and stable Postop Assessment: no signs of nausea or vomiting Anesthetic complications: no     Last Vitals:  Vitals:   04/05/16 1330 04/05/16 1345  BP: (!) 145/86 (!) 154/110  Pulse: 93 91  Resp: 12 (!) 27  Temp:      Last Pain:  Vitals:   04/05/16 0756  TempSrc: Oral  PainSc:    Pain Goal:                 Cecile HearingStephen Edward Nels Munn

## 2016-04-05 NOTE — Anesthesia Postprocedure Evaluation (Signed)
Anesthesia Post Note  Patient: Tawanna SatDanielle Paar  Procedure(s) Performed: Procedure(s) (LRB): DILATATION AND EVACUATION (N/A)  Patient location during evaluation: [facility] Anesthesia Type: General Level of consciousness: awake and alert Pain management: satisfactory to patient Vital Signs Assessment: post-procedure vital signs reviewed and stable Respiratory status: spontaneous breathing and respiratory function stable Cardiovascular status: stable Postop Assessment: adequate PO intake Anesthetic complications: no     Last Vitals:  Vitals:   04/05/16 1650 04/05/16 1758  BP: (!) 143/99 (!) 144/90  Pulse: 92 90  Resp: 18 18  Temp: (!) 38.1 C 37.7 C    Last Pain:  Vitals:   04/05/16 1758  TempSrc: Oral  PainSc:    Pain Goal:                 Cason Luffman

## 2016-04-05 NOTE — Anesthesia Preprocedure Evaluation (Signed)
Anesthesia Evaluation  Patient identified by MRN, date of birth, ID band Patient awake    Reviewed: Allergy & Precautions, NPO status , Patient's Chart, lab work & pertinent test resultsPreop documentation limited or incomplete due to emergent nature of procedure.  Airway Mallampati: III  TM Distance: >3 FB Neck ROM: Full    Dental  (+) Teeth Intact, Dental Advisory Given   Pulmonary neg pulmonary ROS,    Pulmonary exam normal breath sounds clear to auscultation       Cardiovascular Exercise Tolerance: Good negative cardio ROS Normal cardiovascular exam Rhythm:Regular Rate:Normal     Neuro/Psych PSYCHIATRIC DISORDERS Depression negative neurological ROS     GI/Hepatic negative GI ROS, Neg liver ROS, S/p gastric bypass   Endo/Other  Morbid obesity  Renal/GU negative Renal ROS     Musculoskeletal negative musculoskeletal ROS (+)   Abdominal   Peds  Hematology  (+) Blood dyscrasia, anemia ,   Anesthesia Other Findings Day of surgery medications reviewed with the patient.  Reproductive/Obstetrics S/p delivery 4 days ago, now with uncontrolled vaginal bleeding                             Anesthesia Physical Anesthesia Plan  ASA: III and emergent  Anesthesia Plan: General   Post-op Pain Management:    Induction: Intravenous  Airway Management Planned: Oral ETT and Video Laryngoscope Planned  Additional Equipment:   Intra-op Plan:   Post-operative Plan: Extubation in OR  Informed Consent: I have reviewed the patients History and Physical, chart, labs and discussed the procedure including the risks, benefits and alternatives for the proposed anesthesia with the patient or authorized representative who has indicated his/her understanding and acceptance.   Dental advisory given  Plan Discussed with: CRNA  Anesthesia Plan Comments: (Risks/benefits of general anesthesia discussed  with patient including risk of damage to teeth, lips, gum, and tongue, nausea/vomiting, allergic reactions to medications, and the possibility of heart attack, stroke and death.  All patient questions answered.  Patient wishes to proceed.)        Anesthesia Quick Evaluation

## 2016-04-05 NOTE — Progress Notes (Signed)
Faculty Practice OB/GYN Attending Note  Subjective:  Called to evaluate patient with continued heavy bleeding after Methergine, Pitocin and Cytotec administration.  She getting transfused with blood.  Objective:  Blood pressure 157/89, pulse 114, temperature 98.5 F (36.9 C), resp. rate 18, last menstrual period 01/06/2016, SpO2 100 %, currently breastfeeding.  Gen: NAD HENT: Normocephalic, atraumatic Lungs: Normal respiratory effort Heart: Tachycardic Abdomen: Tender, fundus enlarged but firm Pelvic: Continued moderate bleeding Ext: 2+ DTRs, no edema, no cyanosis, negative Homan's sign  Bedside ultrasound: Significant retained products of conception (RPOC)  Assessment & Plan:  25 y.o. G1P1001 with RPOC with ongoing hemorrhage. Urgent D&E needed, will do under ultrasound guidance.  MTP activated.  Risks of surgery including bleeding, infection, injury to surrounding organs, need for additional procedures, possibility of intrauterine scarring which may impair future fertility, risk of need hysterectomy for further uncontrolled bleeding and other postoperative/anesthesia complications were explained to patient.   Written informed consent was obtained.  To OR when ready.   Jaynie CollinsUGONNA  ANYANWU, MD, FACOG Attending Obstetrician & Gynecologist Faculty Practice, [facility] [place]

## 2016-04-06 ENCOUNTER — Encounter (HOSPITAL_COMMUNITY): Payer: Self-pay | Admitting: Obstetrics & Gynecology

## 2016-04-06 LAB — CBC
HEMATOCRIT: 16.2 % — AB (ref 36.0–46.0)
Hemoglobin: 5.7 g/dL — CL (ref 12.0–15.0)
MCH: 30.2 pg (ref 26.0–34.0)
MCHC: 35.2 g/dL (ref 30.0–36.0)
MCV: 85.7 fL (ref 78.0–100.0)
Platelets: 145 10*3/uL — ABNORMAL LOW (ref 150–400)
RBC: 1.89 MIL/uL — ABNORMAL LOW (ref 3.87–5.11)
RDW: 15.3 % (ref 11.5–15.5)
WBC: 9.1 10*3/uL (ref 4.0–10.5)

## 2016-04-06 LAB — PREPARE FRESH FROZEN PLASMA
UNIT DIVISION: 0
UNIT DIVISION: 0

## 2016-04-06 LAB — PREPARE PLATELET PHERESIS: Unit division: 0

## 2016-04-06 LAB — PREPARE RBC (CROSSMATCH)

## 2016-04-06 MED ORDER — SODIUM CHLORIDE 0.9 % IV SOLN
Freq: Once | INTRAVENOUS | Status: AC
  Administered 2016-04-06: 08:00:00 via INTRAVENOUS

## 2016-04-06 NOTE — Progress Notes (Signed)
Patient ID: Carrie Cardenas, female   DOB: 05-01-1991, 25 y.o.   MRN: [id number]   Subjective: Interval History: Minimal drainage in Bakri. Still feels exhausted and light headed at times.  Objective: Vital signs in last 24 hours: Temp:  [98.3 F (36.8 C)-101.3 F (38.5 C)] 99.1 F (37.3 C) (10/18 1047) Pulse Rate:  [89-126] 122 (10/18 1047) Resp:  [9-27] 18 (10/18 1047) BP: (127-155)/(64-110) 127/88 (10/18 1047) SpO2:  [97 %-100 %] 100 % (10/18 0815) Weight:  [259 lb (117.5 kg)] 259 lb (117.5 kg) (10/18 4098)  Intake/Output from previous day: 10/17 0701 - 10/18 0700 In: 4694.6 [P.O.:421; I.V.:2439.6] Out: 5110 [Urine:4275; Drains:60] Intake/Output this shift: Total I/O In: 232 [Blood:232] Out: 560 [Urine:550; Drains:10]  General appearance: alert, cooperative, appears stated age and pale Lungs: normal effort Abdomen: uterus with balloon in place, firm Extremities: Homans sign is negative, no sign of DVT  Results for orders placed or performed during the hospital encounter of 04/05/16 (from the past 24 hour(s))  Hemoglobin and hematocrit, blood (STAT)     Status: Abnormal   Collection Time: 04/05/16  3:00 PM  Result Value Ref Range   Hemoglobin 8.5 (L) 12.0 - 15.0 g/dL   HCT 11.9 (L) 14.7 - 82.9 %  CBC     Status: Abnormal   Collection Time: 04/05/16  7:31 PM  Result Value Ref Range   WBC 12.7 (H) 4.0 - 10.5 K/uL   RBC 2.46 (L) 3.87 - 5.11 MIL/uL   Hemoglobin 7.4 (L) 12.0 - 15.0 g/dL   HCT 56.2 (L) 13.0 - 86.5 %   MCV 85.8 78.0 - 100.0 fL   MCH 30.1 26.0 - 34.0 pg   MCHC 35.1 30.0 - 36.0 g/dL   RDW 78.4 69.6 - 29.5 %   Platelets 150 150 - 400 K/uL  CBC     Status: Abnormal   Collection Time: 04/06/16  5:19 AM  Result Value Ref Range   WBC 9.1 4.0 - 10.5 K/uL   RBC 1.89 (L) 3.87 - 5.11 MIL/uL   Hemoglobin 5.7 (LL) 12.0 - 15.0 g/dL   HCT 28.4 (L) 13.2 - 44.0 %   MCV 85.7 78.0 - 100.0 fL   MCH 30.2 26.0 - 34.0 pg   MCHC 35.2 30.0 - 36.0 g/dL   RDW 10.2 72.5 -  36.6 %   Platelets 145 (L) 150 - 400 K/uL  Prepare fresh frozen plasma     Status: None (Preliminary result)   Collection Time: 04/06/16  7:00 AM  Result Value Ref Range   Unit Number [id number]    Blood Component Type FP24, THAWED    Unit division 00    Status of Unit ISSUED    Transfusion Status OK TO TRANSFUSE   Prepare RBC     Status: None   Collection Time: 04/06/16  7:00 AM  Result Value Ref Range   Order Confirmation ORDER PROCESSED BY BLOOD BANK     Studies/Results: [place] Intraoperative  Result Date: 04/05/2016 CLINICAL DATA:  Ultrasound was provided for use by the ordering physician, and a technical charge was applied by the performing facility.  No radiologist interpretation/professional services rendered.   [place] Mfm Fetal Bpp Wo Non Stress  Result Date: 03/24/2016 OBSTETRICAL ULTRASOUND: This exam was performed within a [place] Ultrasound Department. The OB US report was generated in the AS system, and faxed to the ordering physician.  This report is available in the [employer]. See the AS Obstetric US report via the Image Link.  [place] Mfm Fetal Bpp Wo Non Stress  Result Date: 03/18/2016 OBSTETRICAL ULTRASOUND: This exam was performed within a [place] [facility] Department. The OB US report was generated in the AS system, and faxed to the ordering physician.  This report is available in the [employer]. See the AS Obstetric US report via the Image Link.  [place] Mfm Fetal Bpp Wo Non Stress  Result Date: 03/10/2016 OBSTETRICAL ULTRASOUND: This exam was performed within a [place] Ultras[facility] Department. The OB US report was generated in the AS system, and faxed to the ordering physician.  This report is available in the [employer] PACS. See the AS Obstetric US report via the Image Link.  [place] Mfm Ob Follow Up  Result Date: 03/18/2016 OBSTETRICAL ULTRASOUND: This exam was performed within a [place] Ultrasound Department. The OB US report was generated in the AS system, and  faxed to the ordering physician.  This report is available in the [employer] P[employer]. See the AS Obstetric US report via the Image Link.   Scheduled Meds: . docusate sodium  100 mg Oral BID  . pantoprazole  40 mg Oral Daily  . piperacillin-tazobactam  3.375 g Intravenous Q8H   Continuous Infusions: . lactated ringers 125 mL/hr at 04/06/16 0312   PRN Meds:acetaminophen, alum & mag hydroxide-simeth, bisacodyl, hydrALAZINE, HYDROmorphone (DILAUDID) injection, labetalol, magnesium citrate, menthol-cetylpyridinium, ondansetron **OR** ondansetron (ZOFRAN) IV, oxyCODONE-acetaminophen, senna-docusate, simethicone, zolpidem  Assessment/Plan: Active Problems:   Blood loss anemia   Retained products of conception, following delivery with hemorrhage   Secondary postpartum hemorrhage   Retained products of conception with hemorrhage  Begin slow deflation of the balloon. 50 cc/hour. Removal with removal of vaginal packing later today Continue transfusion of 3u PRBC's and 1 u FFP today. Repeat CBC post transfusion. Continue Zosyn for possible infection.   LOS: 1 day   Reva Boresanya S Patrich Heinze, MD 04/06/2016 12:47 PM

## 2016-04-06 NOTE — Progress Notes (Signed)
Results for Carrie Cardenas, [name] (MRN [id number]) as of 04/06/2016 06:32  Ref. Range 04/06/2016 05:19  WBC Latest Ref Range: 4.0 - 10.5 K/uL 9.1  RBC Latest Ref Range: 3.87 - 5.11 MIL/uL 1.89 (L)  Hemoglobin Latest Ref Range: 12.0 - 15.0 g/dL 5.7 (LL)  HCT Latest Ref Range: 36.0 - 46.0 % 16.2 (L)  MCV Latest Ref Range: 78.0 - 100.0 fL 85.7  MCH Latest Ref Range: 26.0 - 34.0 pg 30.2  MCHC Latest Ref Range: 30.0 - 36.0 g/dL 40.935.2  RDW Latest Ref Range: 11.5 - 15.5 % 15.3  Platelets Latest Ref Range: 150 - 400 K/uL 145 (L)    Report to Dr. Macon LargeAnyanwu re Hemoglobin of 5.7.  Discussed POC and will contact Dr. Russella DarStark to place orders for 3 units PRBC, 1 unit FFP.

## 2016-04-06 NOTE — Progress Notes (Signed)
  50cc fluid withdrawn from Bakri balloon.  Patient tolerated well and no excessive vaginal bleeding noted.

## 2016-04-06 NOTE — Progress Notes (Signed)
I removed final 30 cc of saline and removed the Bakri and vaginal packing. Minimal bleeding noted. 3rd unit of PRBC's to go in. Will d/c foley and check her CBC after her last unit.

## 2016-04-06 NOTE — Progress Notes (Signed)
50 cc fluid removed from Bakri balloon.  Dr. Shawnie PonsPratt present.  Patient tolerated well.

## 2016-04-06 NOTE — Progress Notes (Signed)
50 cc fluid removed from Bakri balloon.  Patient tolerated well.  No excessive vaginal bleeding noted.

## 2016-04-06 NOTE — Progress Notes (Addendum)
MD notified of changes with vitals, no new orders received. Instructed to continue monitoring. Client resting with no complaints at this time

## 2016-04-06 NOTE — Progress Notes (Signed)
CSW met MOB to complete the consult regarding WIC information and transportation barriers to infant's Well-Child appointment.  When CSW arrived, MOB was resting.  MOB gave CSW permission to meet with MOB to discuss [facility], care of the infant, and transportation barriers. MOB reported that MOB was feeling "a little better," but was ready to go home and be with her baby.  CSW inquired about infant's Well-Child appointment at [facility], and per MOB, MOB's Aunt rescheduled the appointment.  MOB stated that the pediatrician office requested that MOB accompany the infant to the initial appointment. MOB communicated that the next appointment is scheduled for Friday; however, MOB did not know the time.  CSW offered to provide a note or a telephone call to the pediatrician's office in the event that MOB is still hospitalized; MOB declined and communicated, "my Aunt and I will work it out." CSW also offered MOB contact information for [employer], and MOB declined.  MOB communicated that MOB prefer to ride [place].  CSW offered MOB bus passes and MOB declined the bus passes, and stated that MOB currently has bus fare. CSW inquired about MOB's [facility] appointment.  MOB stated that MOB will make a [facility] appointment once MOB is dc from the hospital.  CSW encouraged MOB to contact the [facility] today to schedule an appointment for infant in effort decrease the wait for the infant's [facility] voucher.  MOB communicated that MOB will have MOB's aunt to contact the office today. MOB reported that MOB's Aunt is caring for infant while MOB is hospitalized and is assisting MOB with making appointments.  CSW provided MOB with CSW contact information and encouraged MOB to contact CSW if a need arise.  MOB had not questions or concerns at this time.  Laurey Arrow, MSW, LCSW Clinical Social Work [phone]

## 2016-04-07 ENCOUNTER — Encounter (HOSPITAL_COMMUNITY): Payer: Self-pay

## 2016-04-07 ENCOUNTER — Inpatient Hospital Stay (HOSPITAL_COMMUNITY): Payer: Medicaid Other | Admitting: Anesthesiology

## 2016-04-07 ENCOUNTER — Inpatient Hospital Stay (HOSPITAL_COMMUNITY): Payer: Medicaid Other

## 2016-04-07 ENCOUNTER — Encounter (HOSPITAL_COMMUNITY)
Admission: AD | Disposition: A | Discharge status: Home / Self Care | Payer: Self-pay | Source: Ambulatory Visit | Attending: Family Medicine

## 2016-04-07 DIAGNOSIS — Z452 Encounter for adjustment and management of vascular access device: Secondary | ICD-10-CM

## 2016-04-07 DIAGNOSIS — R58 Hemorrhage, not elsewhere classified: Secondary | ICD-10-CM

## 2016-04-07 DIAGNOSIS — Z9884 Bariatric surgery status: Secondary | ICD-10-CM

## 2016-04-07 DIAGNOSIS — D500 Iron deficiency anemia secondary to blood loss (chronic): Secondary | ICD-10-CM

## 2016-04-07 DIAGNOSIS — J9601 Acute respiratory failure with hypoxia: Secondary | ICD-10-CM

## 2016-04-07 DIAGNOSIS — R571 Hypovolemic shock: Secondary | ICD-10-CM

## 2016-04-07 DIAGNOSIS — K226 Gastro-esophageal laceration-hemorrhage syndrome: Secondary | ICD-10-CM

## 2016-04-07 HISTORY — PX: ESOPHAGOGASTRODUODENOSCOPY: SHX5428

## 2016-04-07 HISTORY — DX: Gastro-esophageal laceration-hemorrhage syndrome: K22.6

## 2016-04-07 LAB — HEMOGLOBIN AND HEMATOCRIT, BLOOD
HCT: 18.9 % — ABNORMAL LOW (ref 36.0–46.0)
HCT: 17.6 % — ABNORMAL LOW (ref 36.0–46.0)
HEMATOCRIT: 16.5 % — AB (ref 36.0–46.0)
HEMOGLOBIN: 6 g/dL — AB (ref 12.0–15.0)
HEMOGLOBIN: 6.9 g/dL — AB (ref 12.0–15.0)
Hemoglobin: 6.2 g/dL — CL (ref 12.0–15.0)

## 2016-04-07 LAB — PREPARE FRESH FROZEN PLASMA: UNIT DIVISION: 0

## 2016-04-07 LAB — DIC (DISSEMINATED INTRAVASCULAR COAGULATION) PANEL
APTT: 29 s (ref 24–36)
PLATELETS: 109 10*3/uL — AB (ref 150–400)
SMEAR REVIEW: NONE SEEN

## 2016-04-07 LAB — GLUCOSE, CAPILLARY: GLUCOSE-CAPILLARY: 91 mg/dL (ref 65–99)

## 2016-04-07 LAB — MRSA PCR SCREENING: MRSA BY PCR: NEGATIVE

## 2016-04-07 LAB — DIC (DISSEMINATED INTRAVASCULAR COAGULATION)PANEL
D-Dimer, Quant: 1.05 ug/mL-FEU — ABNORMAL HIGH (ref 0.00–0.50)
Fibrinogen: 167 mg/dL — ABNORMAL LOW (ref 210–475)
INR: 1.28
Prothrombin Time: 16.1 seconds — ABNORMAL HIGH (ref 11.4–15.2)

## 2016-04-07 LAB — PREPARE RBC (CROSSMATCH)

## 2016-04-07 LAB — TRIGLYCERIDES: Triglycerides: 166 mg/dL — ABNORMAL HIGH (ref <150)

## 2016-04-07 LAB — ABO/RH: ABO/RH(D): A POS

## 2016-04-07 LAB — PROCALCITONIN: PROCALCITONIN: 0.42 ng/mL

## 2016-04-07 LAB — LACTIC ACID, PLASMA: LACTIC ACID, VENOUS: 0.9 mmol/L (ref 0.5–1.9)

## 2016-04-07 SURGERY — EGD (ESOPHAGOGASTRODUODENOSCOPY)
Anesthesia: Moderate Sedation

## 2016-04-07 SURGERY — EGD (ESOPHAGOGASTRODUODENOSCOPY)
Anesthesia: General

## 2016-04-07 MED ORDER — PROPOFOL 10 MG/ML IV BOLUS
INTRAVENOUS | Status: AC
  Filled 2016-04-07: qty 20

## 2016-04-07 MED ORDER — ORAL CARE MOUTH RINSE
15.0000 mL | Freq: Four times a day (QID) | OROMUCOSAL | Status: DC
  Administered 2016-04-08 – 2016-04-09 (×6): 15 mL via OROMUCOSAL

## 2016-04-07 MED ORDER — LACTATED RINGERS IV SOLN: INTRAVENOUS | Status: DC

## 2016-04-07 MED ORDER — DEXTROSE-NACL 5-0.45 % IV SOLN
INTRAVENOUS | Status: DC
  Administered 2016-04-07 – 2016-04-09 (×3): via INTRAVENOUS

## 2016-04-07 MED ORDER — DIPHENHYDRAMINE HCL 50 MG/ML IJ SOLN
INTRAMUSCULAR | Status: AC
  Filled 2016-04-07: qty 1

## 2016-04-07 MED ORDER — SODIUM CHLORIDE 0.9 % IV SOLN
Freq: Once | INTRAVENOUS | Status: AC
Start: 2016-04-07 — End: 2016-04-07
  Administered 2016-04-07: 02:00:00 via INTRAVENOUS

## 2016-04-07 MED ORDER — FENTANYL CITRATE (PF) 100 MCG/2ML IJ SOLN
INTRAMUSCULAR | Status: DC | PRN
  Administered 2016-04-07 (×5): 25 ug via INTRAVENOUS

## 2016-04-07 MED ORDER — PROPOFOL 1000 MG/100ML IV EMUL
0.0000 ug/kg/min | INTRAVENOUS | Status: DC
  Administered 2016-04-07 (×2): 50 ug/kg/min via INTRAVENOUS
  Administered 2016-04-07: 43 ug/kg/min via INTRAVENOUS
  Administered 2016-04-08: 20 ug/kg/min via INTRAVENOUS
  Administered 2016-04-08: 25 ug/kg/min via INTRAVENOUS
  Administered 2016-04-08: 20 ug/kg/min via INTRAVENOUS
  Administered 2016-04-08: 50 ug/kg/min via INTRAVENOUS
  Administered 2016-04-08: 40 ug/kg/min via INTRAVENOUS
  Administered 2016-04-09: 20 ug/kg/min via INTRAVENOUS
  Filled 2016-04-07 (×9): qty 100

## 2016-04-07 MED ORDER — FENTANYL CITRATE (PF) 100 MCG/2ML IJ SOLN
INTRAMUSCULAR | Status: AC
  Filled 2016-04-07: qty 2

## 2016-04-07 MED ORDER — MIDAZOLAM HCL 2 MG/2ML IJ SOLN
INTRAMUSCULAR | Status: AC
  Filled 2016-04-07: qty 2

## 2016-04-07 MED ORDER — PROPOFOL 500 MG/50ML IV EMUL
INTRAVENOUS | Status: DC | PRN
Start: 2016-04-07 — End: 2016-04-07
  Administered 2016-04-07: 50 ug/kg/min via INTRAVENOUS

## 2016-04-07 MED ORDER — SODIUM CHLORIDE 0.9 % IV SOLN
Freq: Once | INTRAVENOUS | Status: AC
  Administered 2016-04-07: 11:00:00 via INTRAVENOUS

## 2016-04-07 MED ORDER — PIPERACILLIN-TAZOBACTAM 3.375 G IVPB
3.3750 g | Freq: Three times a day (TID) | INTRAVENOUS | Status: DC
  Administered 2016-04-07 – 2016-04-13 (×18): 3.375 g via INTRAVENOUS
  Filled 2016-04-07 (×21): qty 50

## 2016-04-07 MED ORDER — SODIUM CHLORIDE 0.9 % IV SOLN
INTRAVENOUS | Status: DC
  Administered 2016-04-07: 14:00:00 via INTRAVENOUS

## 2016-04-07 MED ORDER — FENTANYL CITRATE (PF) 100 MCG/2ML IJ SOLN
INTRAMUSCULAR | Status: AC
  Filled 2016-04-07: qty 4

## 2016-04-07 MED ORDER — MIDAZOLAM HCL 10 MG/2ML IJ SOLN
INTRAMUSCULAR | Status: DC | PRN
  Administered 2016-04-07 (×2): 2 mg via INTRAVENOUS
  Administered 2016-04-07: 1 mg via INTRAVENOUS
  Administered 2016-04-07: 2 mg via INTRAVENOUS
  Administered 2016-04-07: 1 mg via INTRAVENOUS
  Administered 2016-04-07: 2 mg via INTRAVENOUS
  Administered 2016-04-07: 1 mg via INTRAVENOUS
  Administered 2016-04-07: 2 mg via INTRAVENOUS
  Administered 2016-04-07: 1 mg via INTRAVENOUS

## 2016-04-07 MED ORDER — SUCCINYLCHOLINE CHLORIDE 20 MG/ML IJ SOLN
INTRAMUSCULAR | Status: DC | PRN
  Administered 2016-04-07: 120 mg via INTRAVENOUS

## 2016-04-07 MED ORDER — SODIUM CHLORIDE 0.9 % IV SOLN
Freq: Once | INTRAVENOUS | Status: AC
  Administered 2016-04-08: 02:00:00 via INTRAVENOUS

## 2016-04-07 MED ORDER — ACETAMINOPHEN 325 MG PO TABS
650.0000 mg | ORAL_TABLET | Freq: Once | ORAL | Status: AC
  Administered 2016-04-07: 650 mg via ORAL
  Filled 2016-04-07: qty 2

## 2016-04-07 MED ORDER — EPINEPHRINE PF 1 MG/10ML IJ SOSY
PREFILLED_SYRINGE | INTRAMUSCULAR | Status: DC | PRN
  Administered 2016-04-07: 9 mL

## 2016-04-07 MED ORDER — FENTANYL BOLUS VIA INFUSION
50.0000 ug | INTRAVENOUS | Status: DC | PRN
  Administered 2016-04-07 – 2016-04-09 (×3): 50 ug via INTRAVENOUS
  Filled 2016-04-07: qty 50

## 2016-04-07 MED ORDER — FENTANYL 2500MCG IN NS 250ML (10MCG/ML) PREMIX INFUSION
25.0000 ug/h | INTRAVENOUS | Status: DC
  Administered 2016-04-07: 25 ug/h via INTRAVENOUS
  Administered 2016-04-08: 75 ug/h via INTRAVENOUS
  Filled 2016-04-07 (×3): qty 250

## 2016-04-07 MED ORDER — PANTOPRAZOLE SODIUM 40 MG IV SOLR
40.0000 mg | Freq: Two times a day (BID) | INTRAVENOUS | Status: DC
  Administered 2016-04-11 – 2016-04-12 (×3): 40 mg via INTRAVENOUS
  Filled 2016-04-07 (×4): qty 40

## 2016-04-07 MED ORDER — ROCURONIUM BROMIDE 100 MG/10ML IV SOLN
INTRAVENOUS | Status: DC | PRN
  Administered 2016-04-07: 35 mg via INTRAVENOUS

## 2016-04-07 MED ORDER — FENTANYL CITRATE (PF) 100 MCG/2ML IJ SOLN: 50.0000 ug | Freq: Once | INTRAMUSCULAR | Status: DC

## 2016-04-07 MED ORDER — SODIUM CHLORIDE 0.9 % IV SOLN
Freq: Once | INTRAVENOUS | Status: AC
  Administered 2016-04-07: 22:00:00 via INTRAVENOUS

## 2016-04-07 MED ORDER — BUTAMBEN-TETRACAINE-BENZOCAINE 2-2-14 % EX AERO
INHALATION_SPRAY | CUTANEOUS | Status: DC | PRN
  Administered 2016-04-07: 2 via TOPICAL

## 2016-04-07 MED ORDER — SODIUM CHLORIDE 0.9 % IV SOLN
Freq: Once | INTRAVENOUS | Status: AC
  Administered 2016-04-07: 23:00:00 via INTRAVENOUS

## 2016-04-07 MED ORDER — EPINEPHRINE PF 1 MG/10ML IJ SOSY
PREFILLED_SYRINGE | INTRAMUSCULAR | Status: AC
  Filled 2016-04-07: qty 10

## 2016-04-07 MED ORDER — SODIUM CHLORIDE 0.9 % IV SOLN
8.0000 mg/h | INTRAVENOUS | Status: AC
  Administered 2016-04-07 – 2016-04-10 (×7): 8 mg/h via INTRAVENOUS
  Filled 2016-04-07 (×16): qty 80

## 2016-04-07 MED ORDER — FUROSEMIDE 10 MG/ML IJ SOLN
20.0000 mg | Freq: Once | INTRAMUSCULAR | Status: AC
  Administered 2016-04-07: 20 mg via INTRAVENOUS
  Filled 2016-04-07: qty 2

## 2016-04-07 MED ORDER — PROPOFOL 10 MG/ML IV BOLUS
INTRAVENOUS | Status: DC | PRN
  Administered 2016-04-07: 150 mg via INTRAVENOUS

## 2016-04-07 MED ORDER — CHLORHEXIDINE GLUCONATE 0.12% ORAL RINSE (MEDLINE KIT)
15.0000 mL | Freq: Two times a day (BID) | OROMUCOSAL | Status: DC
  Administered 2016-04-08 – 2016-04-09 (×4): 15 mL via OROMUCOSAL

## 2016-04-07 MED ORDER — INSULIN ASPART 100 UNIT/ML ~~LOC~~ SOLN: 0.0000 [IU] | SUBCUTANEOUS | Status: DC

## 2016-04-07 MED ORDER — PANTOPRAZOLE SODIUM 40 MG IV SOLR
80.0000 mg | Freq: Once | INTRAVENOUS | Status: AC
  Administered 2016-04-07: 80 mg via INTRAVENOUS
  Filled 2016-04-07: qty 80

## 2016-04-07 MED ORDER — PANTOPRAZOLE SODIUM 40 MG IV SOLR
40.0000 mg | Freq: Two times a day (BID) | INTRAVENOUS | Status: DC
  Administered 2016-04-07: 40 mg via INTRAVENOUS
  Filled 2016-04-07 (×2): qty 40

## 2016-04-07 MED ORDER — LACTATED RINGERS IV SOLN
INTRAVENOUS | Status: DC | PRN
  Administered 2016-04-07: 16:00:00 via INTRAVENOUS

## 2016-04-07 MED ORDER — MIDAZOLAM HCL 5 MG/ML IJ SOLN
INTRAMUSCULAR | Status: AC
  Filled 2016-04-07: qty 2

## 2016-04-07 MED ORDER — SODIUM CHLORIDE 0.9 % IV SOLN
25.0000 ug/h | INTRAVENOUS | Status: DC
  Filled 2016-04-07: qty 50

## 2016-04-07 MED ORDER — FAMOTIDINE IN NACL 20-0.9 MG/50ML-% IV SOLN
20.0000 mg | Freq: Two times a day (BID) | INTRAVENOUS | Status: DC
Start: 2016-04-07 — End: 2016-04-07
  Filled 2016-04-07 (×2): qty 50

## 2016-04-07 MED ORDER — SUCRALFATE 1 GM/10ML PO SUSP
1.0000 g | Freq: Three times a day (TID) | ORAL | Status: DC
  Administered 2016-04-07 – 2016-04-13 (×18): 1 g via ORAL
  Filled 2016-04-07 (×27): qty 10

## 2016-04-07 NOTE — Progress Notes (Signed)
Interim Progress Note:   Went to evaluate patient. First unit of pRBC is transfusing. BP still stable. Still is having some tarry/dark red blood from rectum. No other sites of bleeding noted. Will obtain post-transfusion hgb about 2 hours after second unit is transfused. 2 units are held ahead in case we need.   Palma HolterKanishka G Gunadasa, MD PGY 2 Family Medicine

## 2016-04-07 NOTE — Progress Notes (Signed)
Lab reported critical hemoglobin of 6.9 to RN. Reported to [employer].

## 2016-04-07 NOTE — Progress Notes (Signed)
Patient ID: Carrie Cardenas, female   DOB: 08/14/1990, 25 y.o.   MRN: [id number] Subjective: Pt s/p SVD  04/01/2016.  Pt was readmitted for retained products of conceptions.  She is POD #2 from a D&C.  Pt is /p Bakri balloon insertion and removal with no further GYN bleeding.  Pt has some spotting of light lochia but, denies vaginal bleeding.   Pt reports hematemesis of 3 clots overnight.  Pt with melanotic stools overnight. The last one was around midnight. Pt recalls now that she did take NSAIDS postpartum (she initially denies any NSAIDS).  She reports feeling dizzy but, otherwise has no SOB.  She is upset about being separated from her infant who is beign cared for by her aunt.    She denies currently nausea.   Patient reports no problems voiding.  Objective: I have reviewed patient's vital signs, intake and output and medications. BP 119/71 (BP Location: Right Arm)   Pulse (!) 117   Temp 99.6 F (37.6 C) (Oral)   Resp 18   Ht 5\' 5"  (1.651 m)   Wt 253 lb 1.3 oz (114.8 kg)   LMP 01/06/2016 (Approximate)   SpO2 100%   Breastfeeding? Yes   BMI 42.11 kg/m  I/O last 3 completed shifts: In: 6051.2 [P.O.:1140; I.V.:2462.5; Blood:2248.7; IV Piggyback:200] Out: 4561 [Urine:4500; Drains:61] No intake/output data recorded.  General: alert, appears stated age and no distress Resp: clear to auscultation bilaterally Cardio: regular rate and rhythm, S1, S2 normal, no murmur, click, rub or gallop GI: soft, non-tender; bowel sounds normal; no masses,  no organomegaly Extremities: extremities normal, atraumatic, no cyanosis or edema Vaginal Bleeding: none CBC Latest Ref Rng & Units 04/07/2016 04/06/2016 04/06/2016  WBC 4.0 - 10.5 K/uL - - 9.1  Hemoglobin 12.0 - 15.0 g/dL 6.9(LL) 6.0(LL) 5.7(LL)  Hematocrit 36.0 - 46.0 % 18.9(L) 16.5(L) 16.2(L)  Platelets 150 - 400 K/uL - - 145(L)   Assessment: s/p Procedure(s): DILATATION AND EVACUATION (N/A): stable but, pt has falling Hbg despite 5  units of RBCs and 1 unit of FFP. Suspect GI bleed.  Pt needs a GI consult. Pt refuses transfer to [facility] due to her mothers death there prev.  Have called [facility] hosp to assist with transfer and consult.  GYN issues stable at present  [facility] Dr. Shelly Flattenavid Merrell is the accepting physician.  Pt will go to telemetry.  GI consult Cont IV Protonix/ Carafate for now Transfuse when ready 2 untis of PRBCs    LOS: 2 days    Cardenas, Carrie Miyamoto 04/07/2016, 10:12 AM

## 2016-04-07 NOTE — Progress Notes (Signed)
Alerted by Medical resident that pt remains anemic and didn't respond to blood transfusions thru out day. hgb remains 6 after multiple units of blood and some FFP. Fortunately pt is normotensive.   Recommend transfuse 2 u prbc. Cryo.  rec GI attempt a 2nd attempt to control this endoscopically.  If 2nd endoscopic attempt fails then IR for transarterial embolization and then surgery as last resort.  Keep at least 2u prbc ahead.   Mary SellaEric M. Andrey CampanileWilson, MD, FACS General, Bariatric, & Minimally Invasive Surgery [facility] [place]

## 2016-04-07 NOTE — Progress Notes (Signed)
EGD procedure was interrupted so that MDs could intubate the patient for the procedure.

## 2016-04-07 NOTE — Anesthesia Procedure Notes (Signed)
Procedure Name: Intubation Date/Time: 04/07/2016 3:27 PM Performed by: Marena ChancyBECKNER, Delaney Schnick S Pre-anesthesia Checklist: Patient identified, Emergency Drugs available, Suction available and Patient being monitored Patient Re-evaluated:Patient Re-evaluated prior to inductionOxygen Delivery Method: Circle System Utilized Preoxygenation: Pre-oxygenation with 100% oxygen Intubation Type: IV induction, Rapid sequence and Cricoid Pressure applied Laryngoscope Size: Miller and 2 Grade View: Grade II Tube type: Oral Number of attempts: 1 Airway Equipment and Method: Stylet and Oral airway Placement Confirmation: ETT inserted through vocal cords under direct vision,  positive ETCO2 and breath sounds checked- equal and bilateral Tube secured with: Tape Dental Injury: Teeth and Oropharynx as per pre-operative assessment

## 2016-04-07 NOTE — Transfer of Care (Deleted)
Immediate Anesthesia Transfer of Care Note  Patient: Carrie Cardenas  Procedure(s) Performed: Procedure(s): ESOPHAGOGASTRODUODENOSCOPY (EGD) (N/A)  Patient Location: PACU  Anesthesia Type:General  Level of Consciousness: awake, alert  and oriented  Airway & Oxygen Therapy: Patient remains intubated per anesthesia plan and Patient placed on Ventilator (see vital sign flow sheet for setting)  Post-op Assessment: Report given to RN and Post -op Vital signs reviewed and stable  Post vital signs: Reviewed and stable  Last Vitals:  Vitals:   04/07/16 1520 04/07/16 1525  BP: (!) 144/124   Pulse: (!) 150   Resp: (!) 23 11  Temp:      Last Pain:  Vitals:   04/07/16 1405  TempSrc: Oral  PainSc:       Patients Stated Pain Goal: 3 (04/06/16 2007)  Complications: No apparent anesthesia complications

## 2016-04-07 NOTE — Progress Notes (Signed)
Day of Surgery Procedure(s) (LRB):POD#2 D&C, Bakri balloon placement tranfused 5 units PRBC 1 u FFP  Note from 0100 this am: Patient ID: Carrie Cardenas, female   DOB: 08/19/1990, 25 y.o.   MRN: [id number]   Blood pressure (!) 126/99, pulse (!) 141, temperature 100.1 F (37.8 C), temperature source Oral, resp. rate 18, height 5\' 5"  (1.651 m), weight 259 lb (117.5 kg), last menstrual period 01/06/2016, SpO2 100 %, currently breastfeeding.  Called to see patient regarding: pt had emesis with clots in it, total probably 50 cc of blood, no food or other fluid was in the emesis Pt has not been taking NSAIDs by history,  has not been given any here She denies a history of PUD or hematemesis in the past.  She did state she had reflux the last few weeks of pregnancy She denies current abdominal pain She states she did not have anything similar happen with her gastric sleeve surgery  Her exam is benign no abdominal tenderness except pelvic, no bleeding that I can see orally or pharyngeally from intubation from her surgery  Possible Mallory-Weiss tear vs active upper GI bleed  Will monitor emesis going forward I have ordered IV protonix and carafate slurry in case this is a stomach issue, especially in light of a gastric sleeve surgery 2 years ago    Additionally her H/H post transfusion just returned at 6/18, will transfuse 2 more units PRBC   Ronique Simerly H, MD 04/07/2016 12:52 AM      Subjective: Patient reports feels better, slept since I saw her at 0100 Comfortable no more emesis.   Did have a melanotic after the emesis, I did not hemoccult it because of vaginal bleeding would have been contaminated anyway, I am sure it is transported blood from the upper GI tract given her hematemesis earlier  Very scant vaginal bleeding since Bakri came out  Objective: I have reviewed patient's vital signs, intake and output, medications, labs, microbiology, pathology and radiology  results.  General: alert, cooperative and no distress GI: soft, non-tender; bowel sounds normal; no masses,  no organomegaly, completely benign, no evidence of a perforation  H/H s/p last 2 units of blood 6.9/18.9:  This does not reflect an appropriate rise in values given the amount of blood she has had and the scant vaginal bleeding observed since the Bakri came out  Assessment: Upper GI bleed:  Mallory-Weiss tear vs gastric erosion/ulcer bleeding  Plan: Feel with the poor rise in hemoglobin, the patient needs GI evaluation, probable upper endoscopy for evaluation of her bleeding.  Discussed with Drs Erin FullingHarraway-Smith and Shawnie PonsPratt who concur.  Dr Erin FullingHarraway-Smith to tarrange the GI consult  Pt is aware we were awaiting the hemoglobin results prior to deciding whether needed GI involved   LOS: 2 days    Kimberly Nieland H

## 2016-04-07 NOTE — Progress Notes (Signed)
Client vomiting frank red blood with multiple small clots, client denies any pain or discomfort currently Dr Rhae HammockEurer notified and at bedside to evaluate. Vitals stable, see flowsheet. Will continue monitoring

## 2016-04-07 NOTE — Procedures (Signed)
Central Venous Catheter Insertion Procedure Note Tawanna SatDanielle Makar [id number] 03/23/1991  Procedure: Insertion of Central Venous Catheter Indications: Assessment of intravascular volume, Drug and/or fluid administration and Frequent blood sampling  Procedure Details Consent: Unable to obtain consent because of altered level of consciousness. Time Out: Verified patient identification, verified procedure, site/side was marked, verified correct patient position, special equipment/implants available, medications/allergies/relevent history reviewed, required imaging and test results available.  Performed  Maximum sterile technique was used including antiseptics, cap, gloves, gown, hand hygiene, mask and sheet. Skin prep: Chlorhexidine; local anesthetic administered A antimicrobial bonded/coated triple lumen catheter was placed in the left internal jugular vein using the Seldinger technique.  Evaluation Blood flow good Complications: No apparent complications Patient did tolerate procedure well. Chest X-ray ordered to verify placement.  CXR: pending.  Procedure performed under direct ultrasound guidance for real time vessel cannulation.      Rutherford Guysahul Ayleah Hofmeister, [place] - [facility] [place] [facility] Pager: [phone]  or [phone] 04/07/2016, 4:36 PM

## 2016-04-07 NOTE — Progress Notes (Signed)
Patient ID: Carrie Cardenas, female   DOB: [birth year]awanna Sat3/1992, 25 y.o.   MRN: [id number]   Blood pressure (!) 126/99, pulse (!) 141, temperature 100.1 F (37.8 C), temperature source Oral, resp. rate 18, height 5\' 5"  (1.651 m), weight 259 lb (117.5 kg), last menstrual period 01/06/2016, SpO2 100 %, currently breastfeeding.  Called to see patient regarding: pt had emesis with clots in it, total probably 50 cc of blood, no food or other fluid was in the emesis Pt has not been taking NSAIDs by history,  has not been given any here She denies a history of PUD or hematemesis in the past.  She did state she had reflux the last few weeks of pregnancy She denies current abdominal pain She states she did not have anything similar happen with her gastric sleeve surgery  Her exam is benign no abdominal tenderness except pelvic, no bleeding that I can see orally or pharyngeally from intubation from her surgery  Possible Mallory-Weiss tear vs active upper GI bleed  Will monitor emesis going forward I have ordered IV protonix and carafate slurry in case this is a stomach issue, especially in light of a gastric sleeve surgery 2 years ago    Additionally her Cardenas/Cardenas post transfusion just returned at 6/18, will transfuse 2 more units PRBC   Lazaro ArmsEURE,Carrie Zaun H, MD 04/07/2016 12:52 AM

## 2016-04-07 NOTE — Consult Note (Signed)
PULMONARY / CRITICAL CARE MEDICINE   Name: Carrie Cardenas MRN: [id number] DOB: [birth year]    ADMISSION DATE:  04/05/2016 CONSULTATION DATE:  10/19  REFERRING MD:  Bosie Clos (GI)   CHIEF COMPLAINT:  GI bleed   HISTORY OF PRESENT ILLNESS:   25yo female with hx morbid obesity s/p gastric bypass who is 10 days post partum after spontaneous vaginal delivery 10/13 c/b preeclampsia. She was readmitted 10/17 for retained products and underwent d&c.  Hgb labile with intermittent anemia throughout her admission. She did report NSAID use for pain mgmt after delivery. Overnight 10/18 had hematemesis and worsening anemia with progressive melonic stools.  She was tx from [facility] 10/19 to [facility] for GI workup and EGD which revealed large, bleeding mallory-weiss tear which was very difficult to control despite multiple attempts at cautery, epi, hemoclip.  She was intubated during the procedure for airway protection given the significant amount of bleeding.  Surgery Ezzard Standing) at bedside during EGD.  After the above attempts some hemostasis was achieved and it was decided to observe for now in ICU on vent and PCCM asked to assume care.   PAST MEDICAL HISTORY :  She  has a past medical history of Depression.  PAST SURGICAL HISTORY: She  has a past surgical history that includes Sleeve Gastroplasty (2015); Gastric bypass; and Dilation and evacuation (N/A, 04/05/2016).  No Known Allergies  No current facility-administered medications on file prior to encounter.    Current Outpatient Prescriptions on File Prior to Encounter  Medication Sig  . acetaminophen (TYLENOL) 500 MG tablet Take 1,000 mg by mouth every 6 (six) hours as needed for mild pain or headache.  . ferrous sulfate 325 (65 FE) MG tablet Take 1 tablet (325 mg total) by mouth daily.  . Prenatal Vit-Fe Fumarate-FA (PRENATAL MULTIVITAMIN) TABS tablet Take 1 tablet by mouth daily at 12 noon.  . docusate sodium (COLACE) 100 MG capsule Take 1  capsule (100 mg total) by mouth 2 (two) times daily as needed. (Patient not taking: Reported on 04/05/2016)  . ibuprofen (ADVIL,MOTRIN) 600 MG tablet Take 1 tablet (600 mg total) by mouth every 6 (six) hours. (Patient not taking: Reported on 04/05/2016)    FAMILY HISTORY:  Her indicated that her mother is deceased. She indicated that her father is alive. She indicated that the status of her maternal grandmother is unknown.    SOCIAL HISTORY: She  reports that she has never smoked. She has never used smokeless tobacco. She reports that she does not drink alcohol or use drugs.  REVIEW OF SYSTEMS:   Unable, as per HPI obtained from chart and staff.   SUBJECTIVE:    VITAL SIGNS: BP (!) 144/124   Pulse (!) 150   Temp 98.9 F (37.2 C) (Oral)   Resp 11   Ht 5\' 5"  (1.651 m)   Wt 114.8 kg (253 lb 1.3 oz)   LMP 01/06/2016 (Approximate)   SpO2 100%   Breastfeeding? Yes   BMI 42.11 kg/m   HEMODYNAMICS:    VENTILATOR SETTINGS:    INTAKE / OUTPUT: I/O last 3 completed shifts: In: 6051.2 [P.O.:1140; I.V.:2462.5; Blood:2248.7; IV Piggyback:200] Out: 4561 [Urine:4500; Drains:61]  PHYSICAL EXAMINATION: General:  Obese female, NAD on vent  Neuro:  Sedated, RASS -2 HEENT:  Mm dry, ETT, dried blood  Cardiovascular:  s1s2 rrr Lungs:  resps even non labored on vent, diminished bases  Abdomen: obese, round, soft, hypoactive bs  Musculoskeletal:  Warm and dry, no sig edema  LABS:  BMET  Recent Labs Lab 04/05/16 0906  NA 139  K 4.4  CL 108  CO2 25  BUN 9  CREATININE 0.52  GLUCOSE 87    Electrolytes  Recent Labs Lab 04/01/16 0123 04/01/16 1820 04/05/16 0906  CALCIUM  --   --  8.6*  MG 5.1* 6.4*  --     CBC  Recent Labs Lab 04/05/16 1147  04/05/16 1931 04/06/16 0519 04/06/16 2255 04/07/16 0745  WBC 8.8  --  12.7* 9.1  --   --   HGB 3.9*  < > 7.4* 5.7* 6.0* 6.9*  HCT 12.3*  < > 21.1* 16.2* 16.5* 18.9*  PLT 147*  147*  --  150 145*  --   --   < > = values  in this interval not displayed.  Coag's  Recent Labs Lab 04/05/16 1147  APTT 29  INR 1.07    Sepsis Markers No results for input(s): LATICACIDVEN, PROCALCITON, O2SATVEN in the last 168 hours.  ABG No results for input(s): PHART, PCO2ART, PO2ART in the last 168 hours.  Liver Enzymes  Recent Labs Lab 04/05/16 0906  AST 42*  ALT 45  ALKPHOS 85  BILITOT 0.6  ALBUMIN 2.7*    Cardiac Enzymes No results for input(s): TROPONINI, PROBNP in the last 168 hours.  Glucose No results for input(s): GLUCAP in the last 168 hours.  Imaging No results found.   STUDIES:  EGD 10/19>>> Bleeding Mallory-Weiss tear that was very difficult to control the bleeding despite bicap gold probe cautery, epinephrine submucosal injection, and hemoclip. During procedure patient intubated for airway protection and due to ineffective sedation. See endopro note for details. If rebleeding occurs, then needs surgery and Dr. Ezzard StandingNewman and colleagues on board to assist if needed.  CULTURES:   ANTIBIOTICS: Zosyn 10/17>>  SIGNIFICANT EVENTS: 10/17>> d&c for retained products of conception  10/19>> EGD   LINES/TUBES: ETT 10/19>>>  DISCUSSION: 25yo female s/p vaginal delivery 10/13 who underwent d&c 10/17 for retained products with UGI bleed r/t mallory weiss tear.  Intubated for airway protection during procedure.   ASSESSMENT / PLAN:  GASTROINTESTINAL Upper GI bleeding r/t significant mallory weiss tear - s/p EGD 10/19 with clips, epi, cautery  P:   tx ICU now  GI following  Surgery aware - if re-bleeds will need OR  PPI gtt  NPO   HEMATOLOGIC Blood loss anemia  P:  Stat CBC, type and cross  Goal Hgb >8  DIC panel  PRBC now   PULMONARY Need for mechanical ventilation - intubated for airway protection 10/19 during EGD  P:   Vent support - 8cc/kg  F/u CXR  F/u ABG Daily SBT   CARDIOVASCULAR Hypotension - mild - r/t hemorrhage/volume depletion  P:  Volume resuscitation with  PRBC, FFP  Place CVL  Check CVP  No need for vasopressors at this time   RENAL No active issue  P:   Stat CMET    INFECTIOUS Retained products of conception - d/p d&c 10/17 P:   Empiric zosyn   ENDOCRINE No active issue  P:   Monitor glucose on chem   NEUROLOGIC Sedation needs on vent  P:   RASS goal: -1 Propofol, fent gtt  Need to keep her fairly sedated for now to prevent wretching/further tearing    FAMILY  - Updates:   Family updated at length by GI, surgery and were gone 10/19 at PCCM eval.   - Inter-disciplinary family meet or Palliative Care meeting due by:  10/26  Dirk Dress, NP 04/07/2016  4:18 PM Pager: [phone] or [phone]  ATTENDING NOTE / ATTESTATION NOTE :   I have discussed the case with the resident/APP  [facility].   I agree with the resident/APP's  history, physical examination, assessment, and plans.    I have edited the above note and modified it according to our agreed history, physical examination, assessment and plan.   Briefly, pt had a spontaneous vaginal delivery on 04/01/16. Had 800 mls blood loss and first degree laceration. Pregnancy c/b preeclampsia. She was discharged and came back on 10/17  for vaginal bleeding. She had D&C on 10/17 for retained products of conception. Allegedly, was wretching and vomiting during this time.  She developed hematemesis and melena and became severely anemic. She ended up getting 5upRBC before she was transferred to [facility] for further management. Last Hb before transfer was 6.9 and she was on her 6th u pRBC. DIC work up was (-) on 10/17.  EGD showed medium sized Mallory Weis  Tear which was clipped. Pt's bleeding was difficult to control initially. Surgery was present for procedure as well.  Pt was intubated for airway protection.  Of note, pt with low grade fever since 10/17, was started on zosyn.   Pt seen, sedated, intubated. BP 100/70, HR 120, RR 20, 100% o2 sats. Afebrile. ETT in  place. Comfortable. (-) NVD.  Good ae. CTA. Tachycardic, (-) m.  (+) BS, obese, non tender. (-) vaginal bleeding noted.  Warm distal extremities. (-) edema.   Labs reviewed.  Labs this admission are pending.   Assessment/Plan: 1. Hypovolemic shock 2/2 significant GI bleed from [employer] tear. (-) evidence for DIC based on labs on 10/17. GI following pt.  Surgery (Dr. Ezzard Standing) aware of pt incase she has more bleeding and will need surgical intervention. She is S/P 6u pRBC. Stat labs now. Check Hb and hct q6. Recheck DIC panel. Keep NPO. Keep on protonix drip. Will keep intubated for now.  2. Acute hypoxemic resp failure 2/2 unable to protect airway 2/2 sedation with EGD. Keep intubated for now. Follow up abg, CXR.  3. Fever, post partum. Panculture. Cont zosyn for now. Check PCT. Check lactate. Will add vanc if with clinical deterioration overnight.  4. S/P D and C for retained products of conception.  Ob/Gyn has signed off prior to transfer to [facility].  Will call them back if needed.   Pt is single, her mother is deceased, she is estranged from father.  She only has an aunt and an uncle.  I called up uncle and discussed with him pt's over all condition and prognosis.    I have spent 35  minutes of critical care time with this patient today.   Pollie Meyer, MD 04/07/2016, 4:36 PM [place] [facility] Pager [phone] After 3 pm or if no answer, call [phone]

## 2016-04-07 NOTE — Progress Notes (Signed)
Interim Progress Note:  Nursing reported of hemoglobin of 6.2 this evening. Also noted of dark red blood per rectum x 2 since shift change. Patient's blood pressure is stable. Will order 2 units pRBC and 3u FFP. Discussed with Dr. Arsenio LoaderSommer in [facility].  Palma HolterKanishka G Gunadasa, MD PGY 2 Family Medicine

## 2016-04-07 NOTE — Progress Notes (Signed)
Late entry: Patient was sent to [facility] following EGD due to patient being intubated. Harold BarbanLiz Honeycutt gave report to Gean QuintMaryann Shaver, [facility] RN.

## 2016-04-07 NOTE — Progress Notes (Signed)
[facility]. Report given to Isa RankinJustin S. RN of [facility]. Emtala form completed.

## 2016-04-07 NOTE — Consult Note (Signed)
[facility] Consult Note  Carrie SatDanielle Romick 10/30/1990  [id number].    Requesting MD: Roderic ScarceV Schooler Chief Complaint/Reason for Consult: Mallory-Weiss tear  HPI:  Carrie Cardenas is a 25yo female PMH h/o gastric sleeve in [place] 2 years ago. Of note patient is currently intubated therefore history was taken from her chart.  She is s/p vaginal birth on 04/01/16 that was complicated by preeclampsia and she recovered and was discharged. She was readmitted on 04/05/16 due to retained products of conception and had a D&C that day.  Last night she had several episodes of hematemesis with clots and had black tarry stools last night and this morning. Hgb 8.5 on 04/05/16 and dropped to 5.7 yesterday and now 6.9 after 5 U PRBCs that was given since the Hgb of 5.7. [facility] due to symptomatic anemia and GI bleed. She was directly admitted to endoscopy for emergent endoscopy.  During endoscopy by Dr. Roderic ScarceV. Schooler general surgery was consulted due to concern that they would be unable to stop bleeding. Procedure was stopped and patient intubated. After this the endoscopy was again advanced and fortunately the bleeding did in fact stop.   She is going to be transferred to the ICU to be monitored.  PMH significant for obesity Nonsmoker Abdominal surgical history includes gastric sleeve in [place] 2 years ago.  According to her aunt, she weight as much as 500 pounds prior to the sleeve.  She lost some weight before the sleeve.  Her low weight, before the pregnancy was 220 to 240.  ROS: All systems reviewed and otherwise negative except for as above  Family History  Problem Relation Age of Onset  . Fibroids Mother   . Anemia Mother   . Pulmonary embolism Mother   . Cancer Maternal Grandmother     Past Medical History:  Diagnosis Date  . Depression    pt hospitalized at 25 years old for major depression    Past Surgical History:  Procedure Laterality Date  .  DILATION AND EVACUATION N/A 04/05/2016   Procedure: DILATATION AND EVACUATION;  Surgeon: Tereso NewcomerUgonna A Anyanwu, MD;  Location: [facility]S;  Service: Gynecology;  Laterality: N/A;  . GASTRIC BYPASS    . SLEEVE GASTROPLASTY  2015    Social History:  reports that she has never smoked. She has never used smokeless tobacco. She reports that she does not drink alcohol or use drugs.  Allergies: No Known Allergies  Medications Prior to Admission  Medication Sig Dispense Refill  . acetaminophen (TYLENOL) 500 MG tablet Take 1,000 mg by mouth every 6 (six) hours as needed for mild pain or headache.    . ferrous sulfate 325 (65 FE) MG tablet Take 1 tablet (325 mg total) by mouth daily. 30 tablet 3  . Prenatal Vit-Fe Fumarate-FA (PRENATAL MULTIVITAMIN) TABS tablet Take 1 tablet by mouth daily at 12 noon.    . docusate sodium (COLACE) 100 MG capsule Take 1 capsule (100 mg total) by mouth 2 (two) times daily as needed. (Patient not taking: Reported on 04/05/2016) 30 capsule 2  . ibuprofen (ADVIL,MOTRIN) 600 MG tablet Take 1 tablet (600 mg total) by mouth every 6 (six) hours. (Patient not taking: Reported on 04/05/2016) 30 tablet 3    Blood pressure (!) 144/124, pulse (!) 150, temperature 98.9 F (37.2 C), temperature source Oral, resp. rate 11, height 5\' 5"  (1.651 m), weight 253 lb 1.3 oz (114.8 kg), last menstrual period 01/06/2016, SpO2 100 %, currently breastfeeding. Physical Exam:  General: intubated, sedated, obese AA female who is laying in bed in NAD HEENT: head is normocephalic, atraumatic.   Heart: regular, rate, and rhythm.  No obvious murmurs, gallops, or rubs noted Lungs: lung sounds equal bilaterally.  Respiratory effort nonlabored Abd: healed lap incisions from previous surgery. Soft/doughy, obese, NT/ND, +BS MS: all 4 extremities are symmetrical with no cyanosis, clubbing, or edema. Skin: warm and dry with no masses, lesions, or rashes Psych: intubated, sedated  Results for orders placed or  performed during the hospital encounter of 04/05/16 (from the past 48 hour(s))  CBC     Status: Abnormal   Collection Time: 04/05/16  7:31 PM  Result Value Ref Range   WBC 12.7 (H) 4.0 - 10.5 K/uL   RBC 2.46 (L) 3.87 - 5.11 MIL/uL   Hemoglobin 7.4 (L) 12.0 - 15.0 g/dL   HCT 16.1 (L) 09.6 - 04.5 %   MCV 85.8 78.0 - 100.0 fL   MCH 30.1 26.0 - 34.0 pg   MCHC 35.1 30.0 - 36.0 g/dL   RDW 40.9 81.1 - 91.4 %   Platelets 150 150 - 400 K/uL  CBC     Status: Abnormal   Collection Time: 04/06/16  5:19 AM  Result Value Ref Range   WBC 9.1 4.0 - 10.5 K/uL   RBC 1.89 (L) 3.87 - 5.11 MIL/uL   Hemoglobin 5.7 (LL) 12.0 - 15.0 g/dL    Comment: DELTA CHECK NOTED REPEATED TO VERIFY CRITICAL RESULT CALLED TO, READ BACK BY AND VERIFIED WITH: KEYS,B. @0628  ON 04/06/2016 BY BOVELL,A.    HCT 16.2 (L) 36.0 - 46.0 %   MCV 85.7 78.0 - 100.0 fL   MCH 30.2 26.0 - 34.0 pg   MCHC 35.2 30.0 - 36.0 g/dL   RDW 78.2 95.6 - 21.3 %   Platelets 145 (L) 150 - 400 K/uL  Prepare fresh frozen plasma     Status: None   Collection Time: 04/06/16  7:00 AM  Result Value Ref Range   Unit Number [id number]    Blood Component Type FP24, THAWED    Unit division 00    Status of Unit ISSUED,FINAL    Transfusion Status OK TO TRANSFUSE   Prepare RBC     Status: None   Collection Time: 04/06/16  7:00 AM  Result Value Ref Range   Order Confirmation ORDER PROCESSED BY BLOOD BANK   Hemoglobin and hematocrit, blood     Status: Abnormal   Collection Time: 04/06/16 10:55 PM  Result Value Ref Range   Hemoglobin 6.0 (LL) 12.0 - 15.0 g/dL    Comment: REPEATED TO VERIFY CRITICAL RESULT CALLED TO, READ BACK BY AND VERIFIED WITH: D HILL @ 0040 04/07/16 BY S GEZAHEGN    HCT 16.5 (L) 36.0 - 46.0 %  Prepare RBC     Status: None   Collection Time: 04/07/16 12:40 AM  Result Value Ref Range   Order Confirmation ORDER PROCESSED BY BLOOD BANK   Hemoglobin and hematocrit, blood     Status: Abnormal   Collection Time: 04/07/16  7:45  AM  Result Value Ref Range   Hemoglobin 6.9 (LL) 12.0 - 15.0 g/dL    Comment: POST TRANSFUSION SPECIMEN REPEATED TO VERIFY CRITICAL RESULT CALLED TO, READ BACK BY AND VERIFIED WITH: FIELD,K. @0822  ON 04/07/2016 BY BOVELL,A.    HCT 18.9 (L) 36.0 - 46.0 %   No results found.    Assessment/Plan GI bleed, Mallory-Weiss tear - several episodes of hematemesis with clots, had black tarry  stools last night and this morning - [facility] due to symptomatic anemia - patient has received 5 uPRBC - s/p endoscopy this afternoon where bleeding was stopped. Patient transferred to the unit for observation  ABL anemia - most recent hg 6.9, s/p 5 uPRBC Intubated H/o gastric sleeve in [place] 2 years ago Postpartum - SVD 03/31/2016, complicated by preeclampsia, D&C for retained products of conception on 04/05/2016  Plan - Bleeding has been stopped at this time but if it persists she may need an exploratory laparotomy.   Will continue to follow patient. Continue to monitor hg and transfuse PRN.   Edson Snowball, [facility] 04/07/2016, 3:52 PM Pager: [phone] Consults: [phone] Mon-Fri 7:00 am-4:30 pm Sat-Sun 7:00 am-11:30 am  Agree with above. I was present with Dr. Bosie Clos at the last upper endoscopy. It appears that she has a Mallory Weiss tear about 1 cm below the EG junction.  The MW tear is about 2.5 cm in length, has a clip on it, and is not bleeding.  Will send to ICU, resuscitate, needs IV access, and is intubated.  I talked to her aunt, Marga Gramajo, and her great aunt, Lorre Nick, after the procedure with Dr. Bosie Clos.  The seriousness of her condition was stressed.  MS. Barley lives with [facility], she is not married, and has no parents.  Will follow.  Ovidio Kin, MD, [facility] Surgery Pager: [phone] Office phone:  [phone]

## 2016-04-07 NOTE — Progress Notes (Signed)
2 Days Post-Op Procedure(s) (LRB): DILATATION AND EVACUATION (N/A) PostPartum day #6 s/p SVD  Subjective: Patient reports no problems voiding.  Pt reports hematemesis of 3 clots overnight.  Pt with melanotic stools overnight. The last one was around midnight. Pt recalls now that she did take NSAIDS postpartum (she initially denies any NSAIDS).  She reports feeling dizzy but, otherwise has no SOB.  She is upset about being separated from her infant who is beign cared for by her aunt.    Pt has some spotting of light lochia but, denies vaginal bleeding.  She denies currently nausea.     Objective: I have reviewed patient's vital signs, intake and output and medications. BP 119/71 (BP Location: Right Arm)   Pulse (!) 117   Temp 99.6 F (37.6 C) (Oral)   Resp 18   Ht 5\' 5"  (1.651 m)   Wt 253 lb 1.3 oz (114.8 kg)   LMP 01/06/2016 (Approximate)   SpO2 100%   Breastfeeding? Yes   BMI 42.11 kg/m  I/O last 3 completed shifts: In: 6051.2 [P.O.:1140; I.V.:2462.5; Blood:2248.7; IV Piggyback:200] Out: 4561 [Urine:4500; Drains:61] No intake/output data recorded.   General: alert, appears stated age and no distress Resp: clear to auscultation bilaterally Cardio: regular rate and rhythm, S1, S2 normal, no murmur, click, rub or gallop GI: soft, non-tender; bowel sounds normal; no masses,  no organomegaly Extremities: extremities normal, atraumatic, no cyanosis or edema Vaginal Bleeding: none CBC Latest Ref Rng & Units 04/07/2016 04/06/2016 04/06/2016  WBC 4.0 - 10.5 K/uL - - 9.1  Hemoglobin 12.0 - 15.0 g/dL 6.9(LL) 6.0(LL) 5.7(LL)  Hematocrit 36.0 - 46.0 % 18.9(L) 16.5(L) 16.2(L)  Platelets 150 - 400 K/uL - - 145(L)   Assessment: s/p Procedure(s): DILATATION AND EVACUATION (N/A): stable but, pt has falling Hbg despite 5 units of RBCs and 1 unit of FFP. Suspect GI bleed.  Pt needs a GI consult. Pt refuses transfer to [facility] due to her mothers death.  Have called [facility] hosp to assist with transfer  vs consult.  GYN issues stable at present  Plan: NPO  GI consult Cont IV Protonix Transfuse when ready 2 untis of PRBCs Stale for transfer if GI [facility]    LOS: 2 days    HARRAWAY-SMITH, Nidhi Jacome 04/07/2016, 10:12 AM

## 2016-04-07 NOTE — Progress Notes (Signed)
Pt came to PACU on Propofol infusion.

## 2016-04-07 NOTE — Consult Note (Signed)
Medical Consultation   Carrie Cardenas  AVW:[id number]  DOB: [birth year]  DOA: 04/05/2016  PCP: No primary care provider on file.   Outpatient Specialists: OBGYN, [facility]   Requesting physician: Dr. Erin Fulling  Reason for consultation: GI bleed and symptomatic anemia.   History of Present Illness: Carrie Cardenas is an 25 y.o. female with a past medical history significant for morbid obesity status post gastric bypass and sleeve gastroplasty in 2015 presenting to [facility] as an emergent transfer from [facility] due to symptomatic anemia and GI bleed. Of note patient's recent medical history significant for the following  Spontaneous vaginal delivery on 04/01/2016, complicated by preeclampsia. EBL at time of delivery 800 mL with first degree laceration noted Patient was discharged after an appropriate manner of time but returned on 10/17 D&C performed on 04/05/2016 due to retained products of conception. No complication reported from the procedure.  Hemoglobin has remained labile since admission on the 17th. Patient has received a total of 5 units PRBC since that time. Patient has developed progressive worsening hematemesis and melanotic stools. Of note overnight patient received 1 unit PRBC after additional hematemesis episode. Despite receiving PRBC patient remains symptomatic with an unchanged hemoglobin. Patient is to receive 2 more unit PRBC prior to and en-route to [facility].   Patient admitted directly to endoscopy suites for emergent endoscopy. All and endoscopy patient endorsed feeling extremely tired with some associated lower central abdominal discomfort. Patient denies any active fevers, chest pain or, productive cough, dysuria, frequency, back pain, lower extremity swelling, vaginal discharge/bleeding.  Review of Systems:  ROS As per HPI otherwise 10 point review of systems negative.    Past Medical History: Past Medical  History:  Diagnosis Date  . Depression    pt hospitalized at 25 years old for major depression    Past Surgical History: Past Surgical History:  Procedure Laterality Date  . DILATION AND EVACUATION N/A 04/05/2016   Procedure: DILATATION AND EVACUATION;  Surgeon: Tereso Newcomer, MD;  Location: [facility] ORS;  Service: Gynecology;  Laterality: N/A;  . GASTRIC BYPASS    . SLEEVE GASTROPLASTY  2015     Allergies:  No Known Allergies   Social History:  reports that she has never smoked. She has never used smokeless tobacco. She reports that she does not drink alcohol or use drugs.   Family History: Family History  Problem Relation Age of Onset  . Fibroids Mother   . Anemia Mother   . Pulmonary embolism Mother   . Cancer Maternal Grandmother      Physical Exam: Vitals:   04/07/16 1149 04/07/16 1204 04/07/16 1256 04/07/16 1405  BP: 105/69 107/79 136/81 128/78  Pulse: (!) 131 (!) 132 (!) 121 (!) 130  Resp: 20 20 20  (!) 0  Temp: 100 F (37.8 C) 99.3 F (37.4 C) 99 F (37.2 C) 98.9 F (37.2 C)  TempSrc: Oral Oral Oral Oral  SpO2: 100% 100% 100% 100%  Weight:      Height:        General:  Appears calm and comfortable Eyes:  PERRL, EOMI, normal lids, iris ENT:  grossly normal hearing, lips & tongue, mmm Neck:  no LAD, masses or thyromegaly Cardiovascular:  RRR, no m/r/g.1+ bilateral lower extremity edema Respiratory:  CTA bilaterally, no w/r/r. Normal respiratory effort. Abdomen:  soft, Mild suprapubic tenderness while palpating fundus of patient's uterus which is below the umbilicus Skin:  no rash or induration seen on limited exam Musculoskeletal:  grossly normal tone BUE/BLE, good ROM, no bony abnormality Psychiatric:  grossly normal mood and affect, speech fluent and appropriate, AOx3 Neurologic:  CN 2-12 grossly intact, moves all extremities in coordinated fashion, sensation intact  Data reviewed:  I have personally reviewed following labs and imaging studies Labs:   CBC:  Recent Labs Lab 04/02/16 0559 04/05/16 0906 04/05/16 1147 04/05/16 1500 04/05/16 1931 04/06/16 0519 04/06/16 2255 04/07/16 0745  WBC 16.9* 8.8 8.8  --  12.7* 9.1  --   --   HGB 6.9* 4.4* 3.9* 8.5* 7.4* 5.7* 6.0* 6.9*  HCT 19.1* 13.7* 12.3* 25.2* 21.1* 16.2* 16.5* 18.9*  MCV 82.7 89.5 90.4  --  85.8 85.7  --   --   PLT 157 155 147*  147*  --  150 145*  --   --     Basic Metabolic Panel:  Recent Labs Lab 04/01/16 0123 04/01/16 1820 04/05/16 0906  NA  --   --  139  K  --   --  4.4  CL  --   --  108  CO2  --   --  25  GLUCOSE  --   --  87  BUN  --   --  9  CREATININE  --   --  0.52  CALCIUM  --   --  8.6*  MG 5.1* 6.4*  --    GFR Estimated Creatinine Clearance: 137.1 mL/min (by C-G formula based on SCr of 0.52 mg/dL). Liver Function Tests:  Recent Labs Lab 04/05/16 0906  AST 42*  ALT 45  ALKPHOS 85  BILITOT 0.6  PROT 5.5*  ALBUMIN 2.7*   No results for input(s): LIPASE, AMYLASE in the last 168 hours. No results for input(s): AMMONIA in the last 168 hours. Coagulation profile  Recent Labs Lab 04/05/16 1147  INR 1.07    Cardiac Enzymes: No results for input(s): CKTOTAL, CKMB, CKMBINDEX, TROPONINI in the last 168 hours. BNP: Invalid input(s): POCBNP CBG: No results for input(s): GLUCAP in the last 168 hours. D-Dimer  Recent Labs  04/05/16 1147  DDIMER 4.90*   Hgb A1c No results for input(s): HGBA1C in the last 72 hours. Lipid Profile No results for input(s): CHOL, HDL, LDLCALC, TRIG, CHOLHDL, LDLDIRECT in the last 72 hours. Thyroid function studies No results for input(s): TSH, T4TOTAL, T3FREE, THYROIDAB in the last 72 hours.  Invalid input(s): FREET3 Anemia work up No results for input(s): VITAMINB12, FOLATE, FERRITIN, TIBC, IRON, RETICCTPCT in the last 72 hours. Urinalysis    Component Value Date/Time   COLORURINE ORANGE (A) 03/30/2016 1715   APPEARANCEUR CLEAR 03/30/2016 1715   LABSPEC 1.020 03/30/2016 1715   PHURINE 6.0  03/30/2016 1715   GLUCOSEU NEGATIVE 03/30/2016 1715   HGBUR LARGE (A) 03/30/2016 1715   BILIRUBINUR SMALL (A) 03/30/2016 1715   KETONESUR 15 (A) 03/30/2016 1715   PROTEINUR 30 (A) 03/30/2016 1715   UROBILINOGEN >=8.0 02/08/2016 1617   NITRITE NEGATIVE 03/30/2016 1715   LEUKOCYTESUR SMALL (A) 03/30/2016 1715     Microbiology No results found for this or any previous visit (from the past 240 hour(s)).     Inpatient Medications:   Scheduled Meds: . [MAR Hold] pantoprazole (PROTONIX) IV  40 mg Intravenous Q12H  . [MAR Hold] piperacillin-tazobactam  3.375 g Intravenous Q8H  . [MAR Hold] sucralfate  1 g Oral TID WC & HS   Continuous Infusions: . sodium chloride    . lactated ringers Stopped (  04/07/16 0104)  . lactated ringers 125 mL/hr at 04/07/16 0345     Radiological Exams on Admission: No results found.  Impression/Recommendations Active Problems:   Blood loss anemia   Retained products of conception, following delivery with hemorrhage   Secondary postpartum hemorrhage   Retained products of conception with hemorrhage   Mallory-Weiss tear  Symptomatic Anemia: Likely secondary to upper GI bleed. Other considerations include DIC (though unlikely review of labs shows normal fibrinogen, d-dimer, platelet count), and lower GI bleed. High level of concern for Mallory-Weiss tear versus upper gastrointestinal ulceration. Coordination of care undertaken for emergent endoscopy with Dr.Schooler, of [facility] gastroenterology. Patient currently in the endoscopy with evidence of moderate sized Mallory-Weiss tear. - Stat labs still pending including repeat d-dimer, fibrinogen, CBC, CMET  - Due to high level of concern the patient's condition may decompensate critical care medicine was consult and have agreed to take the patient onto their service.  Tachycardia: Heart rate ranging from 100 tender 139. Suspect volume related - EKG - Aggressive IVF  Postpartum: SVD 03/31/2016, complicated  by preeclampsia, D&C for retained products of conception on 04/05/2016. Patient does not plan to breast-feed. Patient no longer vaginally bleeding. Fundus appears appropriate given her postpartum state and low likelihood of hemorrhaging.  Obesity/status post gastric bypass/sleeve:  - Avoid NSAIDs  Elevated temp: Patient started on Zosyn on 04/06/2016. Discussed with Dr.Haraway-Smith who agrees that there is no overt sign of infectious etiology who then DC'd prior to transfer to [facility]. WBC normal. Last UA on 03/30/2016 which was a contaminated sample. No culture. - Blood cultures if temperature spikes above 100.4 - Urinalysis  Thank you for this consultation.  Our [facility] hospitalist team will follow the patient with you.  Time Spent: Coordination of care >70 min  MERRELL, DAVID J M.D. [facility] 04/07/2016, 3:20 PM

## 2016-04-07 NOTE — Progress Notes (Signed)
Patient transported on vent from PACU to 75M-05 without complications.

## 2016-04-07 NOTE — Progress Notes (Signed)
   Patient coming from [facility] for treatment of symptomatic anemia. Of note patient's history significant for the following:  Spontaneous vaginal delivery on 04/01/2016, complicated by preeclampsia. EBL at time of delivery 800 mL with first degree laceration noted Patient was discharged after an appropriate manner of time but returned on 10/17 D&C performed on 04/05/2016 due to retained products of conception. No complication reported from the procedure.  Hemoglobin has remained labile since admission on the 17th. Patient has received a total of 5 units PRBC since that time. Patient has developed progressive worsening hematemesis and melanotic stools. Of note overnight patient received 1 unit PRBC after additional hematemesis episode. Despite receiving PRBC patient remains symptomatic with an unchanged hemoglobin. Patient is to receive 1 more unit PRBC prior to [facility]. Patient has been accepted to a telemetry bed on inpatient status. Patient appears to be mildly tachycardic, blood pressure and respiratory rate remain stable. Lower GI consult at for emergent evaluation upon patient's arrival. Review of labs show normal fibrinogen levels, platelets, APTT, INR. Doubt DIC but likely GI source. Patient is status post gastric bypass with significant NSAID use after delivery.  Patient is currently nothing by mouth. [facility], Dr. Bosie ClosSchooler, has been consulted and will see the pt upon arrival in endoscopy. Pt accepted [facility] and [facility] aware.   Shelly Flattenavid Merrell, MD [facility] 04/07/2016, 11:38 AM

## 2016-04-07 NOTE — Consult Note (Signed)
Referring Provider: Dr. Shawnie Pons Primary Care Physician:  No primary care provider on file. Primary Gastroenterologist:  Gentry Fitz  Reason for Consultation:  GI bleed  HPI: Carrie Cardenas is a 25 y.o. female s/p vaginal birth on 04/01/16 that was complicated by preeclampsia and she recovered and was d/c'd. She was readmitted on 04/05/16 due to retained products of conception and had a D & C that day. Last night she had several episodes of hematemesis (red blood) with clots and had black tarry stools last night and this morning. No further hematemesis today. Hgb 8.5 on 04/05/16 and dropped to 5.7 yesterday and now 6.9 after 5 U PRBCs that was given since the Hgb of 5.7. Denies abdominal pain. Has been taking Motrin several times per day since delivery. She had a gastric sleeve procedure done in [place] 2 years ago and states that she has not had any problems with her stomach since that time. No history of peptic ulcer disease. Hemodynamically stable. Patient transferred from [facility] today after discussion with her Ob/Gyn doctor.     Past Medical History:  Diagnosis Date  . Depression    pt hospitalized at 25 years old for major depression    Past Surgical History:  Procedure Laterality Date  . DILATION AND EVACUATION N/A 04/05/2016   Procedure: DILATATION AND EVACUATION;  Surgeon: Tereso Newcomer, MD;  Location: [facility];  Service: Gynecology;  Laterality: N/A;  . GASTRIC BYPASS    . SLEEVE GASTROPLASTY  2015    Prior to Admission medications   Medication Sig Start Date End Date Taking? Authorizing Provider  acetaminophen (TYLENOL) 500 MG tablet Take 1,000 mg by mouth every 6 (six) hours as needed for mild pain or headache.   Yes Historical Provider, MD  ferrous sulfate 325 (65 FE) MG tablet Take 1 tablet (325 mg total) by mouth daily. 01/12/16  Yes Duane Lope, NP  Prenatal Vit-Fe Fumarate-FA (PRENATAL MULTIVITAMIN) TABS tablet Take 1 tablet by mouth daily at 12 noon.   Yes  Historical Provider, MD  docusate sodium (COLACE) 100 MG capsule Take 1 capsule (100 mg total) by mouth 2 (two) times daily as needed. Patient not taking: Reported on 04/05/2016 04/04/16   Catalina Antigua, MD  ibuprofen (ADVIL,MOTRIN) 600 MG tablet Take 1 tablet (600 mg total) by mouth every 6 (six) hours. Patient not taking: Reported on 04/05/2016 04/04/16   Catalina Antigua, MD    Scheduled Meds: . [MAR Hold] pantoprazole (PROTONIX) IV  40 mg Intravenous Q12H  . [MAR Hold] piperacillin-tazobactam  3.375 g Intravenous Q8H  . [MAR Hold] sucralfate  1 g Oral TID WC & HS   Continuous Infusions: . sodium chloride    . lactated ringers Stopped (04/07/16 0104)  . lactated ringers 125 mL/hr at 04/07/16 0345   PRN Meds:.[MAR Hold] acetaminophen, [MAR Hold] alum & mag hydroxide-simeth, [MAR Hold] hydrALAZINE, [MAR Hold]  HYDROmorphone (DILAUDID) injection, [MAR Hold] labetalol, [MAR Hold] magnesium citrate, [MAR Hold] menthol-cetylpyridinium, [MAR Hold] ondansetron **OR** [MAR Hold] ondansetron (ZOFRAN) IV, [MAR Hold] oxyCODONE-acetaminophen, [MAR Hold] senna-docusate, [MAR Hold] simethicone, [MAR Hold] zolpidem  Allergies as of 04/05/2016  . (No Known Allergies)    Family History  Problem Relation Age of Onset  . Fibroids Mother   . Anemia Mother   . Pulmonary embolism Mother   . Cancer Maternal Grandmother     Social History   Social History  . Marital status: Single    Spouse name: N/A  . Number of children: N/A  . Years  of education: N/A   Occupational History  . Not on file.   Social History Main Topics  . Smoking status: Never Smoker  . Smokeless tobacco: Never Used  . Alcohol use No  . Drug use: No  . Sexual activity: No   Other Topics Concern  . Not on file   Social History Narrative  . No narrative on file    Review of Systems: All negative except as stated above in HPI.  Physical Exam: Vital signs: Vitals:   04/07/16 1204 04/07/16 1256  BP: 107/79 136/81   Pulse: (!) 132 (!) 121  Resp: 20 20  Temp: 99.3 F (37.4 C) 99 F (37.2 C)   Last BM Date: 04/07/16 General:   Alert,  obese, pleasant and cooperative in NAD HEENT: anicteric sclera Neck: supple, nontender Lungs:  Clear throughout to auscultation.   No wheezes, crackles, or rhonchi. No acute distress. Heart:  Regular rate and rhythm; no murmurs, clicks, rubs,  or gallops. Abdomen: diffuse tenderness with guarding, soft, nondistended, obese, +BS  Rectal:  Deferred Ext: no edema  GI:  Lab Results:  Recent Labs  04/05/16 1147  04/05/16 1931 04/06/16 0519 04/06/16 2255 04/07/16 0745  WBC 8.8  --  12.7* 9.1  --   --   HGB 3.9*  < > 7.4* 5.7* 6.0* 6.9*  HCT 12.3*  < > 21.1* 16.2* 16.5* 18.9*  PLT 147*  147*  --  150 145*  --   --   < > = values in this interval not displayed. BMET  Recent Labs  04/05/16 0906  NA 139  K 4.4  CL 108  CO2 25  GLUCOSE 87  BUN 9  CREATININE 0.52  CALCIUM 8.6*   LFT  Recent Labs  04/05/16 0906  PROT 5.5*  ALBUMIN 2.7*  AST 42*  ALT 45  ALKPHOS 85  BILITOT 0.6   PT/INR  Recent Labs  04/05/16 1147  LABPROT 14.0  INR 1.07     Studies/Results: No results found.  Impression/Plan: 25 yo with acute onset of hematemesis and melena concerning for a Mallory-Weiss tear over an ulcer although she denies retching or vomiting clear fluid prior to seeing the red blood vomitus. EGD to further evaluate. Protonix IV Q 12 hours. Supportive care. Additional blood transfusion to be given.    LOS: 2 days   Ksenia Kunz C.  04/07/2016, 1:55 PM  Pager [phone]  If no answer or after 5 PM call [phone]

## 2016-04-07 NOTE — Transfer of Care (Signed)
Immediate Anesthesia Transfer of Care Note  Patient: Carrie Cardenas  Procedure(s) Performed: Procedure(s): ESOPHAGOGASTRODUODENOSCOPY (EGD) (N/A)  Patient Location: PACU  Anesthesia Type:General  Level of Consciousness: sedated and unresponsive  Airway & Oxygen Therapy: Patient remains intubated per anesthesia plan and Patient placed on Ventilator (see vital sign flow sheet for setting)  Post-op Assessment: Report given to RN and Post -op Vital signs reviewed and stable  Post vital signs: Reviewed and stable  Last Vitals:  Vitals:   04/07/16 1525 04/07/16 1610  BP:    Pulse:  (!) 118  Resp: 11 19  Temp:      Last Pain:  Vitals:   04/07/16 1405  TempSrc: Oral  PainSc:       Patients Stated Pain Goal: 3 (04/06/16 2007)  Complications: No apparent anesthesia complications

## 2016-04-07 NOTE — Op Note (Signed)
[facility] Patient Name: Carrie Cardenas Procedure Date : 04/07/2016 MRN: [id number] Attending MD: Shirley Friar , MD Date of Birth: [birth year] CSN: [id number] Age: 25 Admit Type: Inpatient Procedure:                Upper GI endoscopy Indications:              Hematemesis, Melena Providers:                Shirley Friar, MD, Priscella Mann, RN,                            [facility], Technician Referring MD:              Medicines:                Fentanyl 125 micrograms IV, Midazolam 14 mg IV,                            Propofol per Anesthesia, Cetacaine spray Complications:            No immediate complications. Estimated Blood Loss:     Estimated blood loss: 10 mL requiring treatment                            with electrocautery. Procedure:                Pre-Anesthesia Assessment:                           - Prior to the procedure, a History and Physical                            was performed, and patient medications and                            allergies were reviewed. The patient's tolerance of                            previous anesthesia was also reviewed. The risks                            and benefits of the procedure and the sedation                            options and risks were discussed with the patient.                            All questions were answered, and informed consent                            was obtained. Prior Anticoagulants: The patient has                            taken no previous anticoagulant or antiplatelet  agents. ASA Grade Assessment: III - A patient with                            severe systemic disease. After reviewing the risks                            and benefits, the patient was deemed in                            satisfactory condition to undergo the procedure.                           After obtaining informed consent, the endoscope was   passed under direct vision. Throughout the                            procedure, the patient's blood pressure, pulse, and                            oxygen saturations were monitored continuously. The                            [id number] ([id number] scope was introduced through the                            mouth, and advanced to the second part of duodenum.                            The upper GI endoscopy was extremely difficult due                            to excessive bleeding and ineffective sedation.                            Successful completion of the procedure was aided by                            increasing the dose of sedation medication,                            controlling the bleeding and performing the                            maneuvers documented (below) in this report. Scope In: Scope Out: Findings:      The examined esophagus was normal.      A medium bleeding Mallory-Weiss tear with stigmata of recent bleeding       was found. To stop active bleeding, one hemostatic clip was successfully       placed but the bleeding continued. Area was unsuccessfully injected with       9 mL of a 1:10,000 solution of epinephrine for hemostasis. Coagulation       for hemostasis using heater probe was successful. Estimated blood loss:       10 mL.      Evidence  of a sleeve gastrectomy was found in the gastric body.      The examined duodenum was normal. Impression:               - Normal esophagus.                           - Mallory-Weiss tear. Clip was placed. Treatment                            not successful. Treated with a heater probe.                           - A sleeve gastrectomy was found.                           - Normal examined duodenum.                           - No specimens collected. Moderate Sedation:      Moderate (conscious) sedation was administered by the endoscopy nurse       and supervised by the endoscopist. The following parameters were        monitored: oxygen saturation, heart rate, blood pressure, and response       to care. Recommendation:           - Surgery if rebleeding occurs. Protonix drip. NPO.                            Keep intubated until further notice.                           - NPO.                           - Post procedure medication orders were given. Procedure Code(s):        --- Professional ---                           [phone], Esophagogastroduodenoscopy, flexible,                            transoral; with control of bleeding, any method Diagnosis Code(s):        --- Professional ---                           K92.0, Hematemesis                           K22.6, Gastro-esophageal laceration-hemorrhage                            syndrome                           Z98.84, Bariatric surgery status                           K92.1, Melena (includes Hematochezia) CPT copyright 2016 American  Medical Association. All rights reserved. The codes documented in this report are preliminary and upon coder review may  be revised to meet current compliance requirements. Shirley Friar, MD 04/07/2016 4:08:40 PM This report has been signed electronically. Number of Addenda: 0

## 2016-04-07 NOTE — Brief Op Note (Signed)
Bleeding Mallory-Weiss tear that was very difficult to control the bleeding despite bicap gold probe cautery, epinephrine submucosal injection, and hemoclip. During procedure patient intubated for airway protection and due to ineffective sedation. See endopro note for details. If rebleeding occurs, then needs surgery and Dr. Ezzard StandingNewman and colleagues on board to assist if needed.

## 2016-04-08 ENCOUNTER — Inpatient Hospital Stay (HOSPITAL_COMMUNITY): Payer: Medicaid Other

## 2016-04-08 ENCOUNTER — Encounter (HOSPITAL_COMMUNITY): Payer: Self-pay | Admitting: Gastroenterology

## 2016-04-08 ENCOUNTER — Encounter (HOSPITAL_COMMUNITY)
Admission: AD | Disposition: A | Discharge status: Home / Self Care | Payer: Self-pay | Source: Ambulatory Visit | Attending: Family Medicine

## 2016-04-08 DIAGNOSIS — R58 Hemorrhage, not elsewhere classified: Secondary | ICD-10-CM

## 2016-04-08 HISTORY — PX: ESOPHAGOGASTRODUODENOSCOPY: SHX5428

## 2016-04-08 LAB — GLUCOSE, CAPILLARY
GLUCOSE-CAPILLARY: 66 mg/dL (ref 65–99)
GLUCOSE-CAPILLARY: 78 mg/dL (ref 65–99)
GLUCOSE-CAPILLARY: 78 mg/dL (ref 65–99)
Glucose-Capillary: 61 mg/dL — ABNORMAL LOW (ref 65–99)
Glucose-Capillary: 69 mg/dL (ref 65–99)
Glucose-Capillary: 72 mg/dL (ref 65–99)
Glucose-Capillary: 79 mg/dL (ref 65–99)

## 2016-04-08 LAB — BASIC METABOLIC PANEL
Anion gap: 4 — ABNORMAL LOW (ref 5–15)
BUN: 20 mg/dL (ref 6–20)
CHLORIDE: 115 mmol/L — AB (ref 101–111)
CO2: 24 mmol/L (ref 22–32)
CREATININE: 0.59 mg/dL (ref 0.44–1.00)
Calcium: 8 mg/dL — ABNORMAL LOW (ref 8.9–10.3)
GFR calc Af Amer: >60 mL/min (ref >60)
GFR calc non Af Amer: >60 mL/min (ref >60)
GLUCOSE: 100 mg/dL — AB (ref 65–99)
Potassium: 3.4 mmol/L — ABNORMAL LOW (ref 3.5–5.1)
SODIUM: 143 mmol/L (ref 135–145)

## 2016-04-08 LAB — CBC
HCT: 24.3 % — ABNORMAL LOW (ref 36.0–46.0)
HCT: 23.2 % — ABNORMAL LOW (ref 36.0–46.0)
HEMATOCRIT: 23.3 % — AB (ref 36.0–46.0)
HEMATOCRIT: 18.5 % — AB (ref 36.0–46.0)
HEMOGLOBIN: 8.3 g/dL — AB (ref 12.0–15.0)
Hemoglobin: 6.4 g/dL — CL (ref 12.0–15.0)
Hemoglobin: 8.5 g/dL — ABNORMAL LOW (ref 12.0–15.0)
Hemoglobin: 8 g/dL — ABNORMAL LOW (ref 12.0–15.0)
MCH: 30.2 pg (ref 26.0–34.0)
MCH: 29.2 pg (ref 26.0–34.0)
MCH: 29.8 pg (ref 26.0–34.0)
MCH: 29.2 pg (ref 26.0–34.0)
MCHC: 35.6 g/dL (ref 30.0–36.0)
MCHC: 34.6 g/dL (ref 30.0–36.0)
MCHC: 35 g/dL (ref 30.0–36.0)
MCHC: 34.5 g/dL (ref 30.0–36.0)
MCV: 84.7 fL (ref 78.0–100.0)
MCV: 84.5 fL (ref 78.0–100.0)
MCV: 85.3 fL (ref 78.0–100.0)
MCV: 84.7 fL (ref 78.0–100.0)
PLATELETS: 82 10*3/uL — AB (ref 150–400)
Platelets: 109 10*3/uL — ABNORMAL LOW (ref 150–400)
Platelets: 89 10*3/uL — ABNORMAL LOW (ref 150–400)
Platelets: 87 10*3/uL — ABNORMAL LOW (ref 150–400)
RBC: 2.75 MIL/uL — ABNORMAL LOW (ref 3.87–5.11)
RBC: 2.19 MIL/uL — ABNORMAL LOW (ref 3.87–5.11)
RBC: 2.85 MIL/uL — ABNORMAL LOW (ref 3.87–5.11)
RBC: 2.74 MIL/uL — ABNORMAL LOW (ref 3.87–5.11)
RDW: 15 % (ref 11.5–15.5)
RDW: 15.5 % (ref 11.5–15.5)
RDW: 15.7 % — AB (ref 11.5–15.5)
RDW: 16 % — AB (ref 11.5–15.5)
WBC: 11.6 10*3/uL — AB (ref 4.0–10.5)
WBC: 9.3 10*3/uL (ref 4.0–10.5)
WBC: 9.5 10*3/uL (ref 4.0–10.5)
WBC: 7.7 10*3/uL (ref 4.0–10.5)

## 2016-04-08 LAB — HEMOGLOBIN AND HEMATOCRIT, BLOOD
HCT: 17.9 % — ABNORMAL LOW (ref 36.0–46.0)
HCT: 22.7 % — ABNORMAL LOW (ref 36.0–46.0)
HEMOGLOBIN: 7.9 g/dL — AB (ref 12.0–15.0)
Hemoglobin: 6.5 g/dL — CL (ref 12.0–15.0)

## 2016-04-08 LAB — PROTIME-INR
INR: 1.11
PROTHROMBIN TIME: 14.3 s (ref 11.4–15.2)

## 2016-04-08 LAB — PREPARE RBC (CROSSMATCH)

## 2016-04-08 LAB — PROCALCITONIN: Procalcitonin: 0.37 ng/mL

## 2016-04-08 SURGERY — EGD (ESOPHAGOGASTRODUODENOSCOPY)
Anesthesia: Moderate Sedation

## 2016-04-08 MED ORDER — DEXTROSE 50 % IV SOLN
25.0000 mL | Freq: Once | INTRAVENOUS | Status: AC
  Administered 2016-04-08: 25 mL via INTRAVENOUS

## 2016-04-08 MED ORDER — DIPHENHYDRAMINE HCL 50 MG/ML IJ SOLN
25.0000 mg | Freq: Once | INTRAMUSCULAR | Status: AC
  Administered 2016-04-08: 25 mg via INTRAVENOUS
  Filled 2016-04-08: qty 1

## 2016-04-08 MED ORDER — DEXTROSE 50 % IV SOLN
INTRAVENOUS | Status: AC
  Administered 2016-04-08: 25 mL
  Filled 2016-04-08: qty 50

## 2016-04-08 MED ORDER — SODIUM CHLORIDE 0.9 % IV SOLN
Freq: Once | INTRAVENOUS | Status: AC
  Administered 2016-04-08: 11:00:00 via INTRAVENOUS

## 2016-04-08 MED ORDER — ROCURONIUM BROMIDE 50 MG/5ML IV SOLN
70.0000 mg | Freq: Once | INTRAVENOUS | Status: AC
  Administered 2016-04-08: 70 mg via INTRAVENOUS
  Filled 2016-04-08 (×2): qty 7

## 2016-04-08 MED ORDER — SODIUM CHLORIDE 0.9 % IV SOLN: Freq: Once | INTRAVENOUS | Status: AC

## 2016-04-08 NOTE — Consult Note (Signed)
Chief Complaint: Patient was seen in consultation today for mesenteric/visceral arteriogram with possible embolization Chief Complaint  Patient presents with  . Headache  . Vaginal Bleeding    Referring Physician(s): Groce,S/Ramaswamy,M/Schooler,V  Supervising Physician: Irish Lack  Patient Status: [facility] - In-pt  History of Present Illness: Carrie Cardenas is a 26 y.o. female with past medical history of obesity, depression and prior gastric sleeve surgery in [place] approximately 2 years ago. She underwent vaginal birth on 04/01/16 which was complicated by preeclampsia. She was subsequently discharged from [facility] on 10/16. On 10/17 she was readmitted with retained products of conception and underwent D&C. On 10/18 she re-presented with hematemesis, and black stools and underwent transfusion. Hemoglobin was as low as 5.7 at the time. She was subsequently transferred to [facility] and underwent endoscopy on 10/19. This revealed a medium bleeding Mallory-Weiss tear and evidence of a sleeve gastrectomy. Hemostatic clip, epinephrine injection and heater probe were utilized to prematurely stop the bleeding. She has since had continued bloody stools despite multiple packed red cells and FFP. Current hemoglobin is 6.5. She is currently intubated and receiving blood transfusion. CCS  evaluated patient and recommended second EGD if bleeding continued , however GI service feels the patient is too high risk for perforation with repeat EGD. Request has now been received for mesenteric/visceral arteriogram with possible embolization.  Past Medical History:  Diagnosis Date  . Depression    pt hospitalized at 25 years old for major depression    Past Surgical History:  Procedure Laterality Date  . DILATION AND EVACUATION N/A 04/05/2016   Procedure: DILATATION AND EVACUATION;  Surgeon: Tereso Newcomer, MD;  Location: [facility];  Service: Gynecology;  Laterality: N/A;  .  ESOPHAGOGASTRODUODENOSCOPY N/A 04/07/2016   Procedure: ESOPHAGOGASTRODUODENOSCOPY (EGD);  Surgeon: Charlott Rakes, MD;  Location: [facility] ENDOSCOPY;  Service: Endoscopy;  Laterality: N/A;  . ESOPHAGOGASTRODUODENOSCOPY N/A 04/07/2016   Procedure: ESOPHAGOGASTRODUODENOSCOPY (EGD);  Surgeon: Charlott Rakes, MD;  Location: [facility] OR;  Service: Endoscopy;  Laterality: N/A;  . GASTRIC BYPASS    . SLEEVE GASTROPLASTY  2015    Allergies: Review of patient's allergies indicates no known allergies.  Medications: Prior to Admission medications   Medication Sig Start Date End Date Taking? Authorizing Provider  acetaminophen (TYLENOL) 500 MG tablet Take 1,000 mg by mouth every 6 (six) hours as needed for mild pain or headache.   Yes Historical Provider, MD  ferrous sulfate 325 (65 FE) MG tablet Take 1 tablet (325 mg total) by mouth daily. 01/12/16  Yes Duane Lope, NP  Prenatal Vit-Fe Fumarate-FA (PRENATAL MULTIVITAMIN) TABS tablet Take 1 tablet by mouth daily at 12 noon.   Yes Historical Provider, MD  docusate sodium (COLACE) 100 MG capsule Take 1 capsule (100 mg total) by mouth 2 (two) times daily as needed. Patient not taking: Reported on 04/05/2016 04/04/16   Catalina Antigua, MD  ibuprofen (ADVIL,MOTRIN) 600 MG tablet Take 1 tablet (600 mg total) by mouth every 6 (six) hours. Patient not taking: Reported on 04/05/2016 04/04/16   Catalina Antigua, MD     Family History  Problem Relation Age of Onset  . Fibroids Mother   . Anemia Mother   . Pulmonary embolism Mother   . Cancer Maternal Grandmother     Social History   Social History  . Marital status: Single    Spouse name: N/A  . Number of children: N/A  . Years of education: N/A   Social History Main Topics  . Smoking status: Never  Smoker  . Smokeless tobacco: Never Used  . Alcohol use No  . Drug use: No  . Sexual activity: No   Other Topics Concern  . None   Social History Narrative  . None      Review of Systems see above;  pt intubated  Vital Signs: BP (!) 111/52   Pulse (!) 117   Temp 99.2 F (37.3 C) (Oral)   Resp 19   Ht 5\' 5"  (1.651 m)   Wt 253 lb 1.3 oz (114.8 kg)   LMP 01/06/2016 (Approximate)   SpO2 100%   Breastfeeding? Yes   BMI 42.11 kg/m   Physical Exam intubated, will occasionally open eyes to stimulation; chest clear to auscultation bilaterally. Heart with tachycardic but regular rhythm. Abdomen obese, soft, positive bowel sounds, not significantly tender; lower extremities with no significant edema; blood noted at vaginal/rectal region; intact femoral/distal pulses  Mallampati Score:     Imaging: [place] Intraoperative  Result Date: 04/05/2016 CLINICAL DATA:  Ultrasound was provided for use by the ordering physician, and a technical charge was applied by the performing facility.  No radiologist interpretation/professional services rendered.   Dg Chest Port 1 View  Result Date: 04/08/2016 CLINICAL DATA:  Respiratory failure EXAM: PORTABLE CHEST 1 VIEW COMPARISON:  Portable exam 0417 hours compared to 04/07/2016 FINDINGS: Tip of endotracheal tube projects approximately 2.1 cm above carina. LEFT jugular central venous catheter tip projects over high RIGHT atrium unchanged. Normal heart size, mediastinal contours, and pulmonary vascularity. Decreased lung volumes bilaterally. No gross infiltrate, pleural effusion or pneumothorax. IMPRESSION: Decreased lung volumes without acute infiltrate. Electronically Signed   By: Ulyses Southward M.D.   On: 04/08/2016 07:48   Dg Chest Portable 1 View  Result Date: 04/07/2016 CLINICAL DATA:  Central line placement EXAM: PORTABLE CHEST 1 VIEW COMPARISON:  None. FINDINGS: Endotracheal tube with the tip 3.7 cm above the carina. Left-sided central venous catheter with the tip projecting over the cavoatrial junction. Lungs are clear. No pleural effusion or pneumothorax. Normal cardiomediastinal silhouette. No acute osseous abnormality. IMPRESSION: Endotracheal tube  with the tip 3.7 cm above the carina. Left-sided central venous catheter with the tip projecting over the cavoatrial junction. Electronically Signed   By: Elige Ko   On: 04/07/2016 17:07   Dg Abd Portable 1v  Addendum Date: 04/07/2016   ADDENDUM REPORT: 04/07/2016 19:05 ADDENDUM: There is a technical error with the initial report. Under comparison, the actual findings are listed. The paragraph labeled findings does not apply to this patient. This was pre populated normal language that should have been replaced by what is described in the comparison section. The impression remains the same. Electronically Signed   By: Paulina Fusi M.D.   On: 04/07/2016 19:05   Result Date: 04/07/2016 CLINICAL DATA:  Mallory-Weiss tear.  Assess hemostatic clip. EXAM: PORTABLE ABDOMEN - 1 VIEW COMPARISON:  Bowel gas pattern is normal without evidence of ileus, obstruction or free air. Monitor artifact overlies the gastroesophageal junction region. There seems to be an additional density that could represent a clip or clips. This could be evaluated without the lead in place if desired. FINDINGS: The bowel gas pattern is normal. No radio-opaque calculi or other significant radiographic abnormality are seen. IMPRESSION: Monitor lead artifact overlies the gastroesophageal junction region. There could be additional density there that could represent a clip or clips. This could be repeated without the monitor in place if desired. Electronically Signed: By: Paulina Fusi M.D. On: 04/07/2016 18:57   [place] Mfm  Fetal Bpp Wo Non Stress  Result Date: 03/24/2016 OBSTETRICAL ULTRASOUND: This exam was performed within a [facility]ound Department. The OB US report was generated in the AS system, and faxed to the ordering physician.  This report is available in the [employer] PACS. See the AS Obstetric US report via the Image Link.  [place] Mfm Fetal Bpp Wo Non Stress  Result Date: 03/18/2016 OBSTETRICAL ULTRASOUND: This exam was  performed within a [place] Ultrasound Department. The OB US report was generated in the AS system, and faxed to the ordering physician.  This report is available in the [employer] PACS. See the AS Obstetric US report via the Image Link.  [place] Mfm Fetal Bpp Wo Non Stress  Result Date: 03/10/2016 OBSTETRICAL ULTRASOUND: This exam was performed within a [place] Ultrasound Department. The OB US report was generated in the AS system, and faxed to the ordering physician.  This report is available in the [employer] PACS. See the AS Obstetric US report via the Image Link.  [place] Mfm Ob Follow Up  Result Date: 03/18/2016 OBSTETRICAL ULTRASOUND: This exam was performed within a  Ultrasound Department. The OB US report was generated in the AS system, and faxed to the ordering physician.  This report is available in the [employer] PACS. See the AS Obstetric US report via the Image Link.   Labs:  CBC:  Recent Labs  04/05/16 1931 04/06/16 0519  04/07/16 0745 04/07/16 1640 04/07/16 2253 04/08/16 0215 04/08/16 0945  WBC 12.7* 9.1  --   --   --   --  11.6* 9.3  HGB 7.4* 5.7*  < > 6.9*  --  6.2* 8.3* 6.4*  6.5*  HCT 21.1* 16.2*  < > 18.9*  --  17.6* 23.3* 18.5*  17.9*  PLT 150 145*  --   --  109*  --  109* 89*  < > = values in this interval not displayed.  COAGS:  Recent Labs  04/05/16 1147 04/07/16 1640  INR 1.07 1.28  APTT 29 29    BMP:  Recent Labs  03/30/16 1846 03/31/16 0926 04/05/16 0906 04/08/16 0945  NA 135 134* 139 143  K 3.5 3.5 4.4 3.4*  CL 107 106 108 115*  CO2 24 21* 25 24  GLUCOSE 77 85 87 100*  BUN 11 10 9 20   CALCIUM 8.7* 8.0* 8.6* 8.0*  CREATININE 0.59 0.59 0.52 0.59  GFRNONAA >60 >60 >60 >60  GFRAA >60 >60 >60 >60    LIVER FUNCTION TESTS:  Recent Labs  03/10/16 1422 03/30/16 1846 03/31/16 0926 04/05/16 0906  BILITOT 0.6 0.9 0.5 0.6  AST 38 40 39 42*  ALT 61* 56* 53 45  ALKPHOS 111 129* 140* 85  PROT 6.3* 6.4* 6.1* 5.5*  ALBUMIN 3.0* 3.1*  2.8* 2.7*    TUMOR MARKERS: No results for input(s): AFPTM, CEA, CA199, CHROMGRNA in the last 8760 hours.  Assessment and Plan: 25 y.o. female with past medical history of obesity, depression and prior gastric sleeve surgery in [place] approximately 2 years ago. She underwent vaginal birth on 04/01/16 which was complicated by preeclampsia. She was subsequently [facility] 10[facility]. On 10/17 she was readmitted with retained products of conception and underwent D&C. On 10/18 she re-presented with hematemesis, and black stools and underwent transfusion. Hemoglobin was as low as 5.7 at the time. She was subsequently [facility] and underwent endoscopy on 10/19. This revealed a medium bleeding Mallory-Weiss tear and evidence of a  sleeve gastrectomy. Hemostatic clip, epinephrine injection and heater probe were utilized to prematurely stop the bleeding. She has since had continued bloody stools despite multiple packed red cells and FFP. Current hemoglobin is 6.5. She is currently intubated and receiving blood transfusion. CCS  evaluated patient and recommended second EGD if bleeding continued , however GI service feels the patient is too high risk for perforation with repeat EGD. Request has now been received for mesenteric/visceral arteriogram with possible embolization. Current labs include WBC 11.6, hemoglobin 6.5, platelets 109k, creatinine 0.59, PT 16.1, INR 1.28; temp 99.8. Case has been reviewed by Dr. Fredia Sorrow. Details/risks of visceral/mesenteric arteriography with possible embolization including ,but not limited to internal bleeding, infection, contrast nephropathy, nontarget embolization, inability to embolize vessel, need for possible surgery discussed with patient's aunt , [facility], with her understanding and consent. Procedure scheduled for today as soon as possible. Plan at this time is for embolization only if active bleeding vessel is identified in region.    Thank  you for this interesting consult.  I greatly enjoyed meeting Carrie Cardenas and look forward to participating in their care.  A copy of this report was sent to the requesting provider on this date.  Electronically Signed: D. Jeananne Rama 04/08/2016, 11:19 AM   I spent a total of  40 minutes   in face to face in clinical consultation, greater than 50% of which was counseling/coordinating care for mesenteric/visceral arteriogram with possible embolization

## 2016-04-08 NOTE — Progress Notes (Signed)
Informed Dr.Newman of increased vaginal bleeding brown in color and large amount .MD at bedside to assess.

## 2016-04-08 NOTE — Progress Notes (Addendum)
[facility]:  [phone] General Surgery Progress Note   LOS: 3 days  POD -     Assessment/Plan: 1.  GI bleed secondary to MW tear  ESOPHAGOGASTRODUODENOSCOPY (EGD) - 04/07/2016 - [facility]  Still appears to be bleeding  14 units since bleeding began - pressure has been stable, but Hgb has not improved.  So the question is where to go from here.  I spoke with Dr. Fredia SorrowYamagata - he is uncertain how successful embolization would be.  IR does no embolize the esophagus - for fear of causing ischemia.  So it would depend on how distal her could get in the vessel and if he could identify the bleeding site. I think she needs a repeat endoscopy.  Dr. Bosie ClosSchooler [facility] and not available. I will go ahead with upper endo.  This has been discussed with her aunt, Erich MontaneHope Patteson.  2.  History of [place] gast[place] in [place] - 2015  Preop weight over 400 pounds.  Prior to pregnancy, her aunt, Bridgette HabermannHope, said that she was down to 220-240 3.  Recent delivery of son - 03/31/2106 4.  D&C - 04/05/2016 - Dr. Macon LargeAnyanwu  Vaginal bleeding.  I spoke to Dr. Macon LargeAnyanwu by phone about vaginal bleeding.  She said this is expected.  As long as she was not "hemorrhaging" there was nothing to do.  She did not think that she needed to see the patient.   Active Problems:   Blood loss anemia   Retained products of conception, following delivery with hemorrhage   Secondary postpartum hemorrhage   Retained products of conception with hemorrhage   Mallory-Weiss tear   Anemia, blood loss   Bleeding   History of gastric bypass   Encounter for central line placement   Hypovolemic shock (HCC)   Acute hypoxemic respiratory failure (HCC)   Subjective:  Intubated, sedated.  Objective:   Vitals:   04/08/16 1255 04/08/16 1300  BP:    Pulse: 85 77  Resp: 13 14  Temp:  99 F (37.2 C)     Intake/Output from previous day:  10/19 0701 - 10/20 0700 In: 4054.4 [I.V.:2457.4;  UJWJX:9147Blood:1497; IV Piggyback:100] Out: 4 [Stool:4]  Intake/Output this shift:  Total I/O In: 930.1 [I.V.:520.1; Blood:385; IV Piggyback:25] Out: 300 [Urine:300]   Physical Exam:   General: Obese AA F.  Intubated and sedated.   HEENT: Normal. Pupils equal. .   Lungs: Some rhonchi   Abdomen: Soft   Lab Results:    Recent Labs  04/08/16 0215 04/08/16 0945  WBC 11.6* 9.3  HGB 8.3* 6.4*  6.5*  HCT 23.3* 18.5*  17.9*  PLT 109* 89*    BMET   Recent Labs  04/08/16 0945  NA 143  K 3.4*  CL 115*  CO2 24  GLUCOSE 100*  BUN 20  CREATININE 0.59  CALCIUM 8.0*    PT/INR   Recent Labs  04/07/16 1640  LABPROT 16.1*  INR 1.28    ABG  No results for input(s): PHART, HCO3 in the last 72 hours.  Invalid input(s): PCO2, PO2   Studies/Results:  Dg Chest Port 1 View  Result Date: 04/08/2016 CLINICAL DATA:  Respiratory failure EXAM: PORTABLE CHEST 1 VIEW COMPARISON:  Portable exam 0417 hours compared to 04/07/2016 FINDINGS: Tip of endotracheal tube projects approximately 2.1 cm above carina. LEFT jugular central venous catheter tip projects over high RIGHT atrium unchanged. Normal heart size, mediastinal contours, and pulmonary vascularity. Decreased lung volumes bilaterally. No  gross infiltrate, pleural effusion or pneumothorax. IMPRESSION: Decreased lung volumes without acute infiltrate. Electronically Signed   By: Ulyses Southward M.D.   On: 04/08/2016 07:48   Dg Chest Portable 1 View  Result Date: 04/07/2016 CLINICAL DATA:  Central line placement EXAM: PORTABLE CHEST 1 VIEW COMPARISON:  None. FINDINGS: Endotracheal tube with the tip 3.7 cm above the carina. Left-sided central venous catheter with the tip projecting over the cavoatrial junction. Lungs are clear. No pleural effusion or pneumothorax. Normal cardiomediastinal silhouette. No acute osseous abnormality. IMPRESSION: Endotracheal tube with the tip 3.7 cm above the carina. Left-sided central venous catheter with the tip  projecting over the cavoatrial junction. Electronically Signed   By: Elige Ko   On: 04/07/2016 17:07   Dg Abd Portable 1v  Addendum Date: 04/07/2016   ADDENDUM REPORT: 04/07/2016 19:05 ADDENDUM: There is a technical error with the initial report. Under comparison, the actual findings are listed. The paragraph labeled findings does not apply to this patient. This was pre populated normal language that should have been replaced by what is described in the comparison section. The impression remains the same. Electronically Signed   By: Paulina Fusi M.D.   On: 04/07/2016 19:05   Result Date: 04/07/2016 CLINICAL DATA:  Mallory-Weiss tear.  Assess hemostatic clip. EXAM: PORTABLE ABDOMEN - 1 VIEW COMPARISON:  Bowel gas pattern is normal without evidence of ileus, obstruction or free air. Monitor artifact overlies the gastroesophageal junction region. There seems to be an additional density that could represent a clip or clips. This could be evaluated without the lead in place if desired. FINDINGS: The bowel gas pattern is normal. No radio-opaque calculi or other significant radiographic abnormality are seen. IMPRESSION: Monitor lead artifact overlies the gastroesophageal junction region. There could be additional density there that could represent a clip or clips. This could be repeated without the monitor in place if desired. Electronically Signed: By: Paulina Fusi M.D. On: 04/07/2016 18:57     Anti-infectives:   Anti-infectives    Start     Dose/Rate Route Frequency Ordered Stop   04/07/16 1800  piperacillin-tazobactam (ZOSYN) IVPB 3.375 g     3.375 g 12.5 mL/hr over 240 Minutes Intravenous Every 8 hours 04/07/16 1733     04/06/16 0100  piperacillin-tazobactam (ZOSYN) IVPB 3.375 g  Status:  Discontinued     3.375 g 12.5 mL/hr over 240 Minutes Intravenous Every 8 hours 04/05/16 1800 04/07/16 1733   04/05/16 2000  azithromycin (ZITHROMAX) tablet 1,000 mg     1,000 mg Oral  Once 04/05/16 1829  04/05/16 2146   04/05/16 1430  piperacillin-tazobactam (ZOSYN) IVPB 3.375 g  Status:  Discontinued     3.375 g 12.5 mL/hr over 240 Minutes Intravenous Every 8 hours 04/05/16 1425 04/05/16 1800   04/05/16 1300  doxycycline (VIBRAMYCIN) 200 mg in dextrose 5 % 250 mL IVPB     200 mg 125 mL/hr over 120 Minutes Intravenous  Once 04/05/16 1218 04/05/16 1542      Ovidio Kin, MD, FACS Pager: [phone] [facility] Office: [phone] 04/08/2016

## 2016-04-08 NOTE — Progress Notes (Signed)
[facility] Progress [facility]  Carrie SatDanielle Cardenas 25 y.o. 07/05/1990   Subjective: Intubated. Awake. Responds to name. Bloody (black, red color charted) stools overnight.  Objective: Vital signs in last 24 hours: Vitals:   04/08/16 0800 04/08/16 0807  BP: 118/76 127/75  Pulse:  89  Resp: 20   Temp:  99.5 F (37.5 C)    Physical Exam: Gen: lethargic, intubated, obese HEENT: anicteric sclera CV: RRR Chest: CTA anteriorly Abd: diffuse tenderness with guarding, soft, nondistended, +BS, obese  Lab Results:  Recent Labs  04/05/16 0906  NA 139  K 4.4  CL 108  CO2 25  GLUCOSE 87  BUN 9  CREATININE 0.52  CALCIUM 8.6*    Recent Labs  04/05/16 0906  AST 42*  ALT 45  ALKPHOS 85  BILITOT 0.6  PROT 5.5*  ALBUMIN 2.7*    Recent Labs  04/06/16 0519  04/07/16 1640 04/07/16 2253 04/08/16 0215  WBC 9.1  --   --   --  11.6*  HGB 5.7*  < >  --  6.2* 8.3*  HCT 16.2*  < >  --  17.6* 23.3*  MCV 85.7  --   --   --  84.7  PLT 145*  --  109*  --  109*  < > = values in this interval not displayed.  Recent Labs  04/05/16 1147 04/07/16 1640  LABPROT 14.0 16.1*  INR 1.07 1.28      Assessment/Plan: Upper GI bleed due to Mallory Weiss tear that was very difficult to control despite epi injection, bicap gold probe cautery and hemoclip placement (see procedure note for details). If rebleeding occurs, I do NOT see a role for repeat EGD due to the depth of this tear and increased risk of perforation from additional cautery. I would recommend surgery if significant rebleeding occurs but also could consider IR although would be concerned about increased risk of ischemia if embolization done to artery supplying this area of the stomach. Hgb 8.3 (6.2) but multiple blood transfusions given [facility]. Continue Protonix drip. Ok to extubate from GI standpoint. Will follow.   Carrie Cardenas C. 04/08/2016, 9:04 AM  Pager  [phone]  If no answer or after 5 PM call [phone] ID: Carrie Cardenas, female   DOB: 06/10/1991, 25 y.o.   MRN: [id number]

## 2016-04-08 NOTE — Progress Notes (Signed)
PULMONARY / CRITICAL CARE MEDICINE   Name: Carrie Cardenas MRN: [id number] DOB: 1990-12-10    ADMISSION DATE:  04/05/2016 CONSULTATION DATE:  10/19  REFERRING MD:  Bosie Clos (GI)   CHIEF COMPLAINT:  GI bleed   HISTORY OF PRESENT ILLNESS:   25yo female with hx morbid obesity s/p gastric bypass who is 10 days post partum after spontaneous vaginal delivery 10/13 c/b preeclampsia. She was readmitted 10/17 for retained products and underwent d&c.  Hgb labile with intermittent anemia throughout her admission. She did report NSAID use for pain mgmt after delivery. Overnight 10/18 had hematemesis and worsening anemia with progressive melonic stools.  She was tx [facility]19 to [facility] for GI workup and EGD which revealed large, bleeding mallory-weiss tear which was very difficult to control despite multiple attempts at cautery, epi, hemoclip.  She was intubated during the procedure for airway protection given the significant amount of bleeding.  Surgery Ezzard Standing) at bedside during EGD.  After the above attempts some hemostasis was achieved and it was decided to observe for now in ICU on vent and PCCM asked to assume care.   PAST MEDICAL HISTORY :  She  has a past medical history of Depression.  PAST SURGICAL HISTORY: She  has a past surgical history that includes Sleeve Gastroplasty (2015); Gastric bypass; and Dilation and evacuation (N/A, 04/05/2016).  No Known Allergies  No current facility-administered medications on file prior to encounter.    Current Outpatient Prescriptions on File Prior to Encounter  Medication Sig  . acetaminophen (TYLENOL) 500 MG tablet Take 1,000 mg by mouth every 6 (six) hours as needed for mild pain or headache.  . ferrous sulfate 325 (65 FE) MG tablet Take 1 tablet (325 mg total) by mouth daily.  . Prenatal Vit-Fe Fumarate-FA (PRENATAL MULTIVITAMIN) TABS tablet Take 1 tablet by mouth daily at 12 noon.  . docusate sodium (COLACE) 100 MG capsule Take 1  capsule (100 mg total) by mouth 2 (two) times daily as needed. (Patient not taking: Reported on 04/05/2016)  . ibuprofen (ADVIL,MOTRIN) 600 MG tablet Take 1 tablet (600 mg total) by mouth every 6 (six) hours. (Patient not taking: Reported on 04/05/2016)    FAMILY HISTORY:  Her indicated that her mother is deceased. She indicated that her father is alive. She indicated that the status of her maternal grandmother is unknown.    SOCIAL HISTORY: She  reports that she has never smoked. She has never used smokeless tobacco. She reports that she does not drink alcohol or use drugs.  REVIEW OF SYSTEMS:   Unable, as per HPI obtained from chart and staff.   SUBJECTIVE:    VITAL SIGNS: BP 118/76 (BP Location: Left Arm)   Pulse (!) 110   Temp 99.5 F (37.5 C) (Oral)   Resp 20   Ht 5\' 5"  (1.651 m)   Wt 253 lb 1.3 oz (114.8 kg)   LMP 01/06/2016 (Approximate)   SpO2 100%   Breastfeeding? Yes   BMI 42.11 kg/m   HEMODYNAMICS:    VENTILATOR SETTINGS: Vent Mode: CPAP;PSV FiO2 (%):  [40 %] 40 % Set Rate:  [14 bmp] 14 bmp Vt Set:  [450 mL] 450 mL PEEP:  [5 cmH20] 5 cmH20 Pressure Support:  [5 cmH20] 5 cmH20 Plateau Pressure:  [10 cmH20-16 cmH20] 16 cmH20  INTAKE / OUTPUT: I/O last 3 completed shifts: In: 5462.3 [P.O.:240; I.V.:2763.6; Blood:2308.7; IV Piggyback:150] Out: 704 [Urine:700; Stool:4]  PHYSICAL EXAMINATION: General:  Obese female, NAD on vent, sedated  Neuro:  Sedated, RASS -2 HEENT:  Mm dry, ETT, clear secretions Cardiovascular:  s1s2 rrr Lungs:  resps even non labored on vent, diminished bases  Abdomen: obese, round, soft, hypoactive bs  Musculoskeletal:  Warm and dry, no sig edema  LABS:  BMET  Recent Labs Lab 04/05/16 0906  NA 139  K 4.4  CL 108  CO2 25  BUN 9  CREATININE 0.52  GLUCOSE 87    Electrolytes  Recent Labs Lab 04/01/16 1820 04/05/16 0906  CALCIUM  --  8.6*  MG 6.4*  --     CBC  Recent Labs Lab 04/05/16 1931 04/06/16 0519   04/07/16 0745 04/07/16 1640 04/07/16 2253 04/08/16 0215  WBC 12.7* 9.1  --   --   --   --  11.6*  HGB 7.4* 5.7*  < > 6.9*  --  6.2* 8.3*  HCT 21.1* 16.2*  < > 18.9*  --  17.6* 23.3*  PLT 150 145*  --   --  109*  --  109*  < > = values in this interval not displayed.  Coag's  Recent Labs Lab 04/05/16 1147 04/07/16 1640  APTT 29 29  INR 1.07 1.28    Sepsis Markers  Recent Labs Lab 04/07/16 1653 04/07/16 1953 04/08/16 0215  LATICACIDVEN  --  0.9  --   PROCALCITON 0.42  --  0.37    ABG No results for input(s): PHART, PCO2ART, PO2ART in the last 168 hours.  Liver Enzymes  Recent Labs Lab 04/05/16 0906  AST 42*  ALT 45  ALKPHOS 85  BILITOT 0.6  ALBUMIN 2.7*    Cardiac Enzymes No results for input(s): TROPONINI, PROBNP in the last 168 hours.  Glucose  Recent Labs Lab 04/07/16 1943 04/08/16 0002 04/08/16 0402 04/08/16 0805  GLUCAP 91 78 78 79    Imaging Dg Chest Port 1 View  Result Date: 04/08/2016 CLINICAL DATA:  Respiratory failure EXAM: PORTABLE CHEST 1 VIEW COMPARISON:  Portable exam 0417 hours compared to 04/07/2016 FINDINGS: Tip of endotracheal tube projects approximately 2.1 cm above carina. LEFT jugular central venous catheter tip projects over high RIGHT atrium unchanged. Normal heart size, mediastinal contours, and pulmonary vascularity. Decreased lung volumes bilaterally. No gross infiltrate, pleural effusion or pneumothorax. IMPRESSION: Decreased lung volumes without acute infiltrate. Electronically Signed   By: Ulyses SouthwardMark  Boles M.D.   On: 04/08/2016 07:48   Dg Chest Portable 1 View  Result Date: 04/07/2016 CLINICAL DATA:  Central line placement EXAM: PORTABLE CHEST 1 VIEW COMPARISON:  None. FINDINGS: Endotracheal tube with the tip 3.7 cm above the carina. Left-sided central venous catheter with the tip projecting over the cavoatrial junction. Lungs are clear. No pleural effusion or pneumothorax. Normal cardiomediastinal silhouette. No acute  osseous abnormality. IMPRESSION: Endotracheal tube with the tip 3.7 cm above the carina. Left-sided central venous catheter with the tip projecting over the cavoatrial junction. Electronically Signed   By: Elige KoHetal  Patel   On: 04/07/2016 17:07   Dg Abd Portable 1v  Addendum Date: 04/07/2016   ADDENDUM REPORT: 04/07/2016 19:05 ADDENDUM: There is a technical error with the initial report. Under comparison, the actual findings are listed. The paragraph labeled findings does not apply to this patient. This was pre populated normal language that should have been replaced by what is described in the comparison section. The impression remains the same. Electronically Signed   By: Paulina FusiMark  Shogry M.D.   On: 04/07/2016 19:05   Result Date: 04/07/2016 CLINICAL DATA:  Mallory-Weiss tear.  Assess  hemostatic clip. EXAM: PORTABLE ABDOMEN - 1 VIEW COMPARISON:  Bowel gas pattern is normal without evidence of ileus, obstruction or free air. Monitor artifact overlies the gastroesophageal junction region. There seems to be an additional density that could represent a clip or clips. This could be evaluated without the lead in place if desired. FINDINGS: The bowel gas pattern is normal. No radio-opaque calculi or other significant radiographic abnormality are seen. IMPRESSION: Monitor lead artifact overlies the gastroesophageal junction region. There could be additional density there that could represent a clip or clips. This could be repeated without the monitor in place if desired. Electronically Signed: By: Paulina Fusi M.D. On: 04/07/2016 18:57     STUDIES:  EGD 10/19>>> Bleeding Mallory-Weiss tear that was very difficult to control the bleeding despite bicap gold probe cautery, epinephrine submucosal injection, and hemoclip. During procedure patient intubated for airway protection and due to ineffective sedation. See endo note for details. If rebleeding occurs, then needs surgery and Dr. Ezzard Standing and colleagues on board to  assist if needed.  CULTURES:   ANTIBIOTICS: Zosyn 10/17>>  SIGNIFICANT EVENTS: 10/17>> d&c for retained products of conception  10/19>> EGD   LINES/TUBES: ETT 10/19>>> CVC>> 10/19>>>  DISCUSSION: 25yo female s/p vaginal delivery 10/13 who underwent d&c 10/17 for retained products with UGI bleed r/t mallory weiss tear.  Intubated for airway protection during procedure.   ASSESSMENT / PLAN:  GASTROINTESTINAL Upper GI bleeding r/t significant mallory weiss tear - s/p EGD 10/19 with clips, epi, cautery  P:   GI following  Surgery aware - if re-bleeds will need IR for transarterial embolization or OR  PPI gtt  NPO   HEMATOLOGIC Blood loss anemia  Cryo , FFP 10/19  P:  Trend CBC Q 6 hours as ordered Goal Hgb >8  Monitor for DIC PRBC to keep 2 units ahead   PULMONARY Need for mechanical ventilation - intubated for airway protection 10/19 during EGD Tolerating CPAP / PS well Following commands  P:   Vent support - 8cc/kg  Trend CXR Daily SBT  Maintain saturations > 92% Extubation today  CARDIOVASCULAR Hypotension - mild - r/t hemorrhage/volume depletion Positive  3900 cc last 24 hours P:  Volume resuscitation with PRBC, FFP   Maintain  MAR > 65 CVP's if becomes clinically unstable to guide resuscitation    RENAL No active issue  P:   Trend CMET/ BMET Monitor urine output    INFECTIOUS Retained products of conception - d/p d&c 10/17 PCT>>.37 10/20 Lactate>> 0.9 P:   Empiric zosyn  Trend WBC Lactate/ PCT Follow Cultures Trend fever curve  ENDOCRINE No active issue  P:   Monitor glucose on chem CBG's Q 4 SSI per protocol   NEUROLOGIC Sedation needs on vent  Awakens and follows commands P:   RASS goal: -1 Propofol, fent gtt  Need to keep her fairly sedated for now to prevent wretching/further tearing   Discussion: With continued bleeding pt. For IR Embolization ( Dr. Fredia Sorrow) / Surgery ( Dr. Ezzard Standing) Total Transfusion record to  date 14 units PRBC 5 U FFP 1 Unit Cryoprecipitate  FAMILY  - Updates:   No family at bedside 10/20  - Inter-disciplinary family meet or Palliative Care meeting due by:  10/26  Discussion: Per surgery note, if re-bleeds attempt second EGD, then IR for transarterial embolization with OR as the last resort. Per Dr. Bosie Clos, she is too high risk for perforation for repeat EGD. He would recommend IR then OR.  Bevelyn Ngo,  AGACNP-BC [facility] Pulmonary/Critical Care Medicine 04/08/2016  8:47 AM Pager:  [phone]

## 2016-04-08 NOTE — Progress Notes (Signed)
Hemoglobin 6.5 and Hematocrit 17.9 results called from lab .Jefm BryantSara Groce NP informed and orders received to transfuse [facility]

## 2016-04-08 NOTE — Progress Notes (Signed)
[facility] camera in to room to note vaginal bleeding amount. Stated he will consult OB-GYN

## 2016-04-08 NOTE — Op Note (Addendum)
04/05/2016 - 04/08/2016  1:52 PM  PATIENT:  Carrie Cardenas, 25 y.o., female, MRN: [id number]  PREOP DIAGNOSIS:  Upper GI bleed, [place] ArtistMallory Weiss tear  POSTOP DIAGNOSIS:   [name] tear just beyond the EG junction, about 2.5 cm in length - metal clip in place (placed by Dr. Bosie ClosSchooler yesterday), no active bleeding [photo at end of note]  PROCEDURE:  Esophagogastroscopy  SURGEON:   Ovidio Kinavid Hollis Tuller, M.D.  ANESTHESIA:   Patient already intubated and sedated in ICU (36M)  INDICATIONS FOR PROCEDURE:  Carrie Cardenas is a 25 y.o. (DOB: 06/14/1991)  AA female whose primary care physician is No primary care provider on file.   She presented acutely with a UGI bleed from a Mallory Weiss tear.  Dr. Bosie ClosSchooler did a upper endoscopy and was able to control the bleeding yesterday.  But her hgb has continued to sag, despite a relatively stable BP.  Her aunt, Erich MontaneHope Kooi [phone]), has power of atty and has signed the consent.   [facility] - so I have agreed to go ahead and endoscope the patient to evaluate the MW tear.   The indications and risks of the endoscopy were explained to her aunt, [place].  The risks include, but are not limited to, perforation, bleeding, or injury to the bowel.  PROCEDURE:  The patient was monitored with a pulse oximetry, BP cuff, and EKG.  She is intubated and was paralyzed prior to the procedure.   A flexible Pentax endoscope was passed down the throat without difficulty.  Findings include:   Esophagus:   Normal   GE junction at:  36 cm   Stomach: Clip and tear seen. There is a coagulum over the tear and there is no evidence of bleeding.   Duodenum:   Not entered  PLAN:  Continue ICU resuscitation.  Follow Hgb q 6 hours.  If there is suspicion of bleeding, would recommend repeat endo.  Discussed with Dr. Bosie ClosSchooler.    Clip on MW tear - no evidence of bleeding.  The MW tear is about 1 cm below the EG  junction.  Ovidio Kinavid Tyliyah Mcmeekin, MD, [facility] Pager: [phone] Office phone:  [phone]

## 2016-04-08 NOTE — Progress Notes (Signed)
Initial Nutrition Assessment  DOCUMENTATION CODES:   Morbid obesity  INTERVENTION:    If TF started, recommend initiating Vital High Protein at goal rate of 10 ml/h (240 ml per day) and Prostat 60 ml QID  TF regimen + current Propofol infusion to provide 1586 kcals, 141 gm protein, 201 ml free water daily  NUTRITION DIAGNOSIS:   Inadequate oral intake related to inability to eat as evidenced by NPO status  GOAL:   Provide needs based on ASPEN/SCCM guidelines  MONITOR:   Vent status, Labs, Weight trends, Skin, I & O's  REASON FOR ASSESSMENT:   Ventilator  ASSESSMENT:   [name] with past medical history of obesity, depression and prior gastric [place] in [place] approximately 2 years ago. She underwent vaginal birth on 04/01/16 which was complicated by preeclampsia. [facility] On 10/17 she was readmitted with retained products of conception and underwent D&C. On 10/18 she re-presented with hematemesis, and black stools and underwent transfusion  Patient is currently intubated on ventilator support Temp (24hrs), Avg:99.2 F (37.3 C), Min:97.2 F (36.2 C), Max:100 F (37.8 C)  Propofol: 20.7 ml/hr >>> 546 fat kcals  Pt with GI bleed secondary to Mallory-Weiss tear. Pt s/p repeat EGD today >> no evidence of bleeding. No nutrition problems identified PTA. No muscle or subcutaneous fat depletion noticed. Labs and medications reviewed.  Diet Order:  Diet NPO time specified Except for: Sips with Meds  Skin:  Reviewed, no issues  Last BM:  10/20   CBG (last 3)   Recent Labs  04/08/16 0805 04/08/16 1131 04/08/16 1507  GLUCAP 79 72 66   Height:   Ht Readings from Last 1 Encounters:  04/06/16 5\' 5"  (1.651 m)    Weight:   Wt Readings from Last 1 Encounters:  04/07/16 253 lb 1.3 oz (114.8 kg)    Ideal Body Weight:  56.8 kg  BMI:  Body mass index is 42.11 kg/m.  Estimated Nutritional  Needs:   Kcal:  1265-1610  Protein:  >/= 140 gm  Fluid:  per MD  EDUCATION NEEDS:   No education needs identified at this time  Maureen ChattersKatie Vivan Vanderveer, RD, LDN Pager #: [phone] After-Hours Pager #: [phone]

## 2016-04-08 NOTE — Care Management Note (Signed)
Case Management Note  Patient Details  Name: Carrie Cardenas MRN: [id number] Date of Birth: 02/01/1991  Subjective/Objective:     She underwent vaginal birth on 04/01/16 which was complicated by preeclampsia. [facility] was [facility]. On 10/17 she was readmitted with retained products of conception and underwent D&C. On 10/18 she re-presented with hematemesis, and black stools and underwent transfusion. Hemoglobin was as low as 5.7 at the time. She was subsequently transferred to [facility] and underwent endoscopy on 10/19. This revealed a medium bleeding Mallory-Weiss tear and evidence of a sleeve gastrectomy. Hemostatic clip, epinephrine injection and heater probe were utilized to prematurely stop the bleeding. She has since had continued bloody stools despite multiple packed red cells and FFP. Current hemoglobin is 6.5. She is currently intubated and receiving blood transfusion. CCS  evaluated patient and recommended second EGD if bleeding continued , however GI service feels the patient is too high risk for perforation with repeat EGD. Request has now been received for mesenteric/visceral arteriogram with possible embolization.                 Action/Plan:  [place]  CM will continue to follow for discharge needs   Expected Discharge Date:                  Expected Discharge Plan:  Home/Self Care  In-House Referral:     Discharge planning Services  CM Consult  Post Acute Care Choice:    Choice offered to:     DME Arranged:    DME Agency:     HH Arranged:    HH Agency:  [facility]  Status of Service:     If discussed at Long Length of Stay Meetings, dates discussed:    Additional Comments:  [name], Aaro Meyers S, RN 04/08/2016, 2:15 PM

## 2016-04-08 NOTE — Anesthesia Preprocedure Evaluation (Signed)
Anesthesia Evaluation  Patient identified by MRN, date of birth, ID band Patient awake    Reviewed: Allergy & Precautions, NPO status , Patient's Chart, lab work & pertinent test resultsPreop documentation limited or incomplete due to emergent nature of procedure.  Airway Mallampati: III  TM Distance: >3 FB Neck ROM: Full    Dental  (+) Teeth Intact, Dental Advisory Given   Pulmonary neg pulmonary ROS,    Pulmonary exam normal breath sounds clear to auscultation       Cardiovascular Exercise Tolerance: Good hypertension, Normal cardiovascular exam Rhythm:Regular Rate:Normal     Neuro/Psych PSYCHIATRIC DISORDERS Depression negative neurological ROS     GI/Hepatic negative GI ROS, Neg liver ROS, S/p gastric bypass Concern for esophageal tear   Endo/Other  Morbid obesity  Renal/GU negative Renal ROS     Musculoskeletal negative musculoskeletal ROS (+)   Abdominal   Peds  Hematology  (+) Blood dyscrasia, anemia ,   Anesthesia Other Findings Day of surgery medications reviewed with the patient.  Reproductive/Obstetrics S/p delivery 4 days ago, now with uncontrolled vaginal bleeding                             Anesthesia Physical  Anesthesia Plan  ASA: III and emergent  Anesthesia Plan: General   Post-op Pain Management:    Induction: Intravenous  Airway Management Planned: Oral ETT  Additional Equipment:   Intra-op Plan:   Post-operative Plan: Post-operative intubation/ventilation  Informed Consent: I have reviewed the patients History and Physical, chart, labs and discussed the procedure including the risks, benefits and alternatives for the proposed anesthesia with the patient or authorized representative who has indicated his/her understanding and acceptance.   Dental advisory given  Plan Discussed with: CRNA  Anesthesia Plan Comments:         Anesthesia Quick  Evaluation

## 2016-04-09 ENCOUNTER — Inpatient Hospital Stay (HOSPITAL_COMMUNITY): Payer: Medicaid Other

## 2016-04-09 LAB — PREPARE FRESH FROZEN PLASMA
UNIT DIVISION: 0
Unit division: 0
Unit division: 0
Unit division: 0
Unit division: 0

## 2016-04-09 LAB — PREPARE CRYOPRECIPITATE
UNIT DIVISION: 0
Unit division: 0

## 2016-04-09 LAB — TYPE AND SCREEN
ABO/RH(D): A POS
ANTIBODY SCREEN: NEGATIVE
UNIT DIVISION: 0
UNIT DIVISION: 0
UNIT DIVISION: 0
UNIT DIVISION: 0
UNIT DIVISION: 0
UNIT DIVISION: 0
UNIT DIVISION: 0
UNIT DIVISION: 0
Unit division: 0
Unit division: 0
Unit division: 0
Unit division: 0

## 2016-04-09 LAB — HEMOGLOBIN AND HEMATOCRIT, BLOOD
HCT: 22 % — ABNORMAL LOW (ref 36.0–46.0)
HCT: 23.4 % — ABNORMAL LOW (ref 36.0–46.0)
HEMOGLOBIN: 7.9 g/dL — AB (ref 12.0–15.0)
Hemoglobin: 7.4 g/dL — ABNORMAL LOW (ref 12.0–15.0)

## 2016-04-09 LAB — CALCIUM, IONIZED: CALCIUM, IONIZED, SERUM: 4.8 mg/dL (ref 4.5–5.6)

## 2016-04-09 LAB — GLUCOSE, CAPILLARY
GLUCOSE-CAPILLARY: 71 mg/dL (ref 65–99)
GLUCOSE-CAPILLARY: 92 mg/dL (ref 65–99)
Glucose-Capillary: 71 mg/dL (ref 65–99)
Glucose-Capillary: 75 mg/dL (ref 65–99)
Glucose-Capillary: 77 mg/dL (ref 65–99)
Glucose-Capillary: 82 mg/dL (ref 65–99)
Glucose-Capillary: 83 mg/dL (ref 65–99)
Glucose-Capillary: 92 mg/dL (ref 65–99)

## 2016-04-09 LAB — BASIC METABOLIC PANEL WITH GFR
Anion gap: 4 — ABNORMAL LOW (ref 5–15)
BUN: 15 mg/dL (ref 6–20)
CO2: 23 mmol/L (ref 22–32)
Calcium: 8.3 mg/dL — ABNORMAL LOW (ref 8.9–10.3)
Chloride: 113 mmol/L — ABNORMAL HIGH (ref 101–111)
Creatinine, Ser: 0.55 mg/dL (ref 0.44–1.00)
GFR calc Af Amer: >60 mL/min
GFR calc non Af Amer: >60 mL/min
Glucose, Bld: 88 mg/dL (ref 65–99)
Potassium: 3.2 mmol/L — ABNORMAL LOW (ref 3.5–5.1)
Sodium: 140 mmol/L (ref 135–145)

## 2016-04-09 LAB — PROCALCITONIN: PROCALCITONIN: 0.18 ng/mL

## 2016-04-09 LAB — MAGNESIUM: MAGNESIUM: 1.7 mg/dL (ref 1.7–2.4)

## 2016-04-09 LAB — PHOSPHORUS: PHOSPHORUS: 4.1 mg/dL (ref 2.5–4.6)

## 2016-04-09 MED ORDER — MAGNESIUM SULFATE 2 GM/50ML IV SOLN
2.0000 g | Freq: Once | INTRAVENOUS | Status: AC
  Administered 2016-04-09: 2 g via INTRAVENOUS
  Filled 2016-04-09: qty 50

## 2016-04-09 MED ORDER — POTASSIUM CHLORIDE 10 MEQ/100ML IV SOLN
10.0000 meq | INTRAVENOUS | Status: AC
  Administered 2016-04-09 (×2): 10 meq via INTRAVENOUS
  Filled 2016-04-09 (×2): qty 100

## 2016-04-09 NOTE — Progress Notes (Addendum)
PULMONARY / CRITICAL CARE MEDICINE   Name: Carrie Cardenas MRN: [id number] DOB: 03/13/1991    ADMISSION DATE:  04/05/2016 CONSULTATION DATE:  10/19  REFERRING MD:  Bosie ClosSchooler (GI)   CHIEF COMPLAINT:  GI bleed   brief 25yo female with hx morbid obesity s/p gastric bypass who is 10 days post partum after spontaneous vaginal delivery 10/13 c/b preeclampsia. She was readmitted 10/17 for retained products and underwent d&c.  Hgb labile with intermittent anemia throughout her admission. She did report NSAID use for pain mgmt after delivery. Overnight 10/18 had hematemesis and worsening anemia with progressive melonic stools.  [facility] and EGD which revealed large, bleeding mallory-weiss tear which was very difficult to control despite multiple attempts at cautery, epi, hemoclip.  She was intubated during the procedure for airway protection given the significant amount of bleeding.  Surgery Ezzard Standing([place]) at bedside during EGD.  After the above attempts some hemostasis was achieved and it was decided to observe for now in ICU on vent and PCCM asked to assume care.   Events 04/08/16 - repeat EGD by CCS - no bleed  SUBJECTIVE:  04/09/16 - clinicall no more bleedin per CCS eval and per RN eval. Doing SBT. Meets extubation criteria  VITAL SIGNS: BP 111/67 (BP Location: Left Arm)   Pulse (!) 101   Temp 98.3 F (36.8 C) (Oral)   Resp 12   Ht 5\' 5"  (1.651 m)   Wt 114.8 kg (253 lb 1.3 oz)   LMP 01/06/2016 (Approximate)   SpO2 100%   Breastfeeding? Yes   BMI 42.11 kg/m   HEMODYNAMICS:    VENTILATOR SETTINGS: Vent Mode: CPAP;PSV FiO2 (%):  [40 %] 40 % Set Rate:  [14 bmp] 14 bmp Vt Set:  [450 mL] 450 mL PEEP:  [5 cmH20] 5 cmH20 Pressure Support:  [5 cmH20] 5 cmH20 Plateau Pressure:  [11 cmH20-15 cmH20] 11 cmH20  INTAKE / OUTPUT: I/O last 3 completed shifts: In: 7727 [I.V.:4861; WGNFA:2130Blood:2641; IV Piggyback:225] Out: 749 [Urine:745;  Stool:4]  PHYSICAL EXAMINATION: General:  Obese female,  Neuro:  RASS 0+ 1.Moves all4s. CAM-ICU neg for delirim HEENT:  Mm dry, ETT, clear secretions Cardiovascular:  s1s2 rrr Lungs:  resps even non labored on vent, diminished bases  Abdomen: obese, round, soft, hypoactive bs  Musculoskeletal:  Warm and dry, no sig edema  LABS:  BMET  Recent Labs Lab 04/05/16 0906 04/08/16 0945  NA 139 143  K 4.4 3.4*  CL 108 115*  CO2 25 24  BUN 9 20  CREATININE 0.52 0.59  GLUCOSE 87 100*    Electrolytes  Recent Labs Lab 04/05/16 0906 04/08/16 0945 04/09/16 0515  CALCIUM 8.6* 8.0*  --   MG  --   --  1.7  PHOS  --   --  4.1    CBC  Recent Labs Lab 04/08/16 0945 04/08/16 1200 04/08/16 1653 04/08/16 2312 04/09/16 0515  WBC 9.3 9.5 7.7  --   --   HGB 6.4*  6.5* 8.5* 8.0* 7.9* 7.4*  HCT 18.5*  17.9* 24.3* 23.2* 22.7* 22.0*  PLT 89* 87* 82*  --   --     Coag's  Recent Labs Lab 04/05/16 1147 04/07/16 1640 04/08/16 1653  APTT 29 29  --   INR 1.07 1.28 1.11    Sepsis Markers  Recent Labs Lab 04/07/16 1653 04/07/16 1953 04/08/16 0215 04/09/16 0515  LATICACIDVEN  --  0.9  --   --  PROCALCITON 0.42  --  0.37 0.18    ABG No results for input(s): PHART, PCO2ART, PO2ART in the last 168 hours.  Liver Enzymes  Recent Labs Lab 04/05/16 0906  AST 42*  ALT 45  ALKPHOS 85  BILITOT 0.6  ALBUMIN 2.7*    Cardiac Enzymes No results for input(s): TROPONINI, PROBNP in the last 168 hours.  Glucose  Recent Labs Lab 04/08/16 2003 04/08/16 2037 04/08/16 2338 04/09/16 0006 04/09/16 0340 04/09/16 0756  GLUCAP 61* 82 69 92 75 83    Imaging Dg Chest Port 1 View  Result Date: 04/09/2016 CLINICAL DATA:  Respiratory failure EXAM: PORTABLE CHEST 1 VIEW COMPARISON:  04/08/2016 FINDINGS: Cardiac shadow is at the upper limits of normal in size but stable. Left jugular central line and endotracheal tube are again seen and stable. The lungs are hypoaerated but  otherwise clear. No focal confluent infiltrate is seen. No bony abnormality is noted. IMPRESSION: Poor inspiratory effort although no acute abnormality is seen. Electronically Signed   By: Alcide Clever M.D.   On: 04/09/2016 07:31    Recent Labs Lab 04/07/16 1653 04/07/16 1953 04/08/16 0215 04/09/16 0515  LATICACIDVEN  --  0.9  --   --   PROCALCITON 0.42  --  0.37 0.18      STUDIES:  EGD 10/19>>> Bleeding Mallory-Weiss tear that was very difficult to control the bleeding despite bicap gold probe cautery, epinephrine submucosal injection, and hemoclip. During procedure patient intubated for airway protection and due to ineffective sedation. See endo note for details. If rebleeding occurs, then needs surgery and Dr. Ezzard Standing and colleagues on board to assist if needed.  CULTURES:   ANTIBIOTICS: Zosyn 10/17>>  SIGNIFICANT EVENTS: 10/17>> d&c for retained products of conception  10/19>> EGD   LINES/TUBES: ETT 10/19>>> CVC>> 10/19>>>  DISCUSSION: 25yo female s/p vaginal delivery 10/13 who underwent d&c 10/17 for retained products with UGI bleed r/t mallory weiss tear.  Intubated for airway protection during procedure.   ASSESSMENT / PLAN:  GASTROINTESTINAL Upper GI bleeding r/t significant mallory weiss tear - s/p EGD 10/19 with clips, epi, cautery   10/21 - bleeding appears resolved. CCS signded off  P:   GI following  PPI  NPO If re-bleeds repeat EGD to determine surgery indication   HEMATOLOGIC Blood loss anemia  Cryo , FFP 10/19  P:  Trend CBC daily Goal Hgb >7 in ICU   PULMONARY Need for mechanical ventilation - intubated for airway protection 10/19 during EGD Tolerating CPAP / PS well Following commands    -meets extubation criteriaa P:   Extubation today  CARDIOVASCULAR Hypotension - mild - r/t hemorrhage/volume depletion Positive  3900 cc last 24 hours   - 3rd spacing+ P:  Maintain  MAP > 65 Start lasix 04/10/16     RENAL No active issue   P:   Trend CMET/ BMET Monitor urine output    INFECTIOUS Retained products of conception - d/p d&c 10/17 PCT>>.37 10/20 Lactate>> 0.9  P:   Empiric zosyn  - low threashold to stop. Will need to check with GYN 04/10/16    ENDOCRINE No active issue  P:   Monitor glucose on chem CBG's Q 4 SSI per protocol   NEUROLOGIC Sedation needs on vent  Awakens and follows commands   - normal mental status 04/09/2016  P:   Dc sedation gtt   FAMILY  - Updates:   No family at bedside 10/20. Patient updated 04/09/2016   - Inter-disciplinary family meet or Palliative  Care meeting due by:  10/26   GLOBAL Extubate. Monitorin ICU. PRBC for hgb> 7gm%. If well move out 04/10/16 and CCM off with TRH primary from 04/10/16  - d/with DR Joseph Art    The patient is critically ill with multiple organ systems failure and requires high complexity decision making for assessment and support, frequent evaluation and titration of therapies, application of advanced monitoring technologies and extensive interpretation of multiple databases.   Critical Care Time devoted to patient care services described in this note is  30  Minutes. This time reflects time of care of this signee Dr Kalman Shan. This critical care time does not reflect procedure time, or teaching time or supervisory time of PA/NP/Med student/Med Resident etc but could involve care discussion time    Dr. Kalman Shan, M.D., [facility].C.P Pulmonary and Critical Care Medicine Staff Physician [facility] Pager: [phone], If no answer or between  15:00h - 7:00h: call [phone]  04/09/2016 8:26 AM

## 2016-04-09 NOTE — Progress Notes (Signed)
90 mls diprivan wasted and 220 cc fentany wasted after extubation and witness by Con-wayCrystal Manus RN

## 2016-04-09 NOTE — Progress Notes (Signed)
Tawanna Satanielle Dames 3:16 PM  Subjective: Patient seen and examined and her hospital computer chart reviewed and her case discussed with my partner Dr. Bosie ClosSchooler and I discussed endoscopy with the patient and she is currently not having any complaints or signs of bleeding and is glad to be extubated and is looking forward to seeing her son later today  Objective: Vital signs stable afebrile no acute distress abdomen is soft nontender hemoglobin stable BUN okay  Assessment: Mallory-Weiss tear  Plan: Clear liquids today and tomorrow and then slowly advance diet on Monday and hopefully can go home Monday after dinner or Tuesday if no further bleeding and continue pump inhibitors for some time  [facility],Aaryana Betke E  Pager [phone] After 5PM or if no answer call [phone]

## 2016-04-09 NOTE — Anesthesia Postprocedure Evaluation (Signed)
Anesthesia Post Note  Patient: Carrie Cardenas  Procedure(s) Performed: Procedure(s) (LRB): ESOPHAGOGASTRODUODENOSCOPY (EGD) (N/A)  Patient location during evaluation: PACU Anesthesia Type: General Level of consciousness: sedated and patient remains intubated per anesthesia plan Pain management: pain level controlled Vital Signs Assessment: post-procedure vital signs reviewed and stable Respiratory status: patient remains intubated per anesthesia plan and patient on ventilator - see flowsheet for VS Cardiovascular status: blood pressure returned to baseline and stable Postop Assessment: no signs of nausea or vomiting Anesthetic complications: no     Last Vitals:  Vitals:   04/09/16 0755 04/09/16 0800  BP:  111/67  Pulse:  (!) 101  Resp:  12  Temp: 36.8 C     Last Pain:  Vitals:   04/09/16 0755  TempSrc: Oral  PainSc:    Pain Goal: Patients Stated Pain Goal: 3 (04/06/16 2007)               Cecile HearingStephen Edward Turk

## 2016-04-09 NOTE — Procedures (Signed)
Extubation Procedure Note  Patient Details:   Name: Carrie Cardenas DOB: 03/08/1991 MRN: [id number]   Airway Documentation:     Evaluation  O2 sats: stable throughout Complications: No apparent complications Patient did tolerate procedure well. Bilateral Breath Sounds: Clear   Yes   Patient extubated to 2lnc. Vital signs stable throughout. No complications. Patient tolerating well at this time. RN at bedside. RT will continue to monitor.  Carrie Cardenas, Carrie Cardenas 04/09/2016, 8:59 AM

## 2016-04-09 NOTE — Progress Notes (Signed)
1 Day Post-Op  Subjective: Intubated, awake alert  Objective: Vital signs in last 24 hours: Temp:  [98.3 F (36.8 C)-99.8 F (37.7 C)] 98.3 F (36.8 C) (10/21 0755) Pulse Rate:  [77-117] 101 (10/21 0800) Resp:  [4-24] 12 (10/21 0800) BP: (98-152)/(50-96) 111/67 (10/21 0800) SpO2:  [100 %] 100 % (10/21 0800) FiO2 (%):  [40 %] 40 % (10/21 0754) Last BM Date: 04/08/16  Intake/Output from previous day: 10/20 0701 - 10/21 0700 In: 4246.3 [I.V.:2977.3; ZOXWR:6045Blood:1144; IV Piggyback:125] Out: 745 [Urine:745] Intake/Output this shift: Total I/O In: 135 [I.V.:135] Out: -   GI: soft nt  Lab Results:   Recent Labs  04/08/16 1200 04/08/16 1653 04/08/16 2312 04/09/16 0515  WBC 9.5 7.7  --   --   HGB 8.5* 8.0* 7.9* 7.4*  HCT 24.3* 23.2* 22.7* 22.0*  PLT 87* 82*  --   --    BMET  Recent Labs  04/08/16 0945  NA 143  K 3.4*  CL 115*  CO2 24  GLUCOSE 100*  BUN 20  CREATININE 0.59  CALCIUM 8.0*   PT/INR  Recent Labs  04/07/16 1640 04/08/16 1653  LABPROT 16.1* 14.3  INR 1.28 1.11   ABG No results for input(s): PHART, HCO3 in the last 72 hours.  Invalid input(s): PCO2, PO2  Studies/Results: Dg Chest Port 1 View  Result Date: 04/09/2016 CLINICAL DATA:  Respiratory failure EXAM: PORTABLE CHEST 1 VIEW COMPARISON:  04/08/2016 FINDINGS: Cardiac shadow is at the upper limits of normal in size but stable. Left jugular central line and endotracheal tube are again seen and stable. The lungs are hypoaerated but otherwise clear. No focal confluent infiltrate is seen. No bony abnormality is noted. IMPRESSION: Poor inspiratory effort although no acute abnormality is seen. Electronically Signed   By: Alcide CleverMark  Lukens M.D.   On: 04/09/2016 07:31   Dg Chest Port 1 View  Result Date: 04/08/2016 CLINICAL DATA:  Respiratory failure EXAM: PORTABLE CHEST 1 VIEW COMPARISON:  Portable exam 0417 hours compared to 04/07/2016 FINDINGS: Tip of endotracheal tube projects approximately 2.1 cm  above carina. LEFT jugular central venous catheter tip projects over high RIGHT atrium unchanged. Normal heart size, mediastinal contours, and pulmonary vascularity. Decreased lung volumes bilaterally. No gross infiltrate, pleural effusion or pneumothorax. IMPRESSION: Decreased lung volumes without acute infiltrate. Electronically Signed   By: Ulyses SouthwardMark  Boles M.D.   On: 04/08/2016 07:48   Dg Chest Portable 1 View  Result Date: 04/07/2016 CLINICAL DATA:  Central line placement EXAM: PORTABLE CHEST 1 VIEW COMPARISON:  None. FINDINGS: Endotracheal tube with the tip 3.7 cm above the carina. Left-sided central venous catheter with the tip projecting over the cavoatrial junction. Lungs are clear. No pleural effusion or pneumothorax. Normal cardiomediastinal silhouette. No acute osseous abnormality. IMPRESSION: Endotracheal tube with the tip 3.7 cm above the carina. Left-sided central venous catheter with the tip projecting over the cavoatrial junction. Electronically Signed   By: Elige KoHetal  Patel   On: 04/07/2016 17:07   Dg Abd Portable 1v  Addendum Date: 04/07/2016   ADDENDUM REPORT: 04/07/2016 19:05 ADDENDUM: There is a technical error with the initial report. Under comparison, the actual findings are listed. The paragraph labeled findings does not apply to this patient. This was pre populated normal language that should have been replaced by what is described in the comparison section. The impression remains the same. Electronically Signed   By: Paulina FusiMark  Shogry M.D.   On: 04/07/2016 19:05   Result Date: 04/07/2016 CLINICAL DATA:  Mallory-Weiss tear.  Assess hemostatic clip. EXAM: PORTABLE ABDOMEN - 1 VIEW COMPARISON:  Bowel gas pattern is normal without evidence of ileus, obstruction or free air. Monitor artifact overlies the gastroesophageal junction region. There seems to be an additional density that could represent a clip or clips. This could be evaluated without the lead in place if desired. FINDINGS: The bowel  gas pattern is normal. No radio-opaque calculi or other significant radiographic abnormality are seen. IMPRESSION: Monitor lead artifact overlies the gastroesophageal junction region. There could be additional density there that could represent a clip or clips. This could be repeated without the monitor in place if desired. Electronically Signed: By: Paulina Fusi M.D. On: 04/07/2016 18:57    Anti-infectives: Anti-infectives    Start     Dose/Rate Route Frequency Ordered Stop   04/07/16 1800  piperacillin-tazobactam (ZOSYN) IVPB 3.375 g     3.375 g 12.5 mL/hr over 240 Minutes Intravenous Every 8 hours 04/07/16 1733     04/06/16 0100  piperacillin-tazobactam (ZOSYN) IVPB 3.375 g  Status:  Discontinued     3.375 g 12.5 mL/hr over 240 Minutes Intravenous Every 8 hours 04/05/16 1800 04/07/16 1733   04/05/16 2000  azithromycin (ZITHROMAX) tablet 1,000 mg     1,000 mg Oral  Once 04/05/16 1829 04/05/16 2146   04/05/16 1430  piperacillin-tazobactam (ZOSYN) IVPB 3.375 g  Status:  Discontinued     3.375 g 12.5 mL/hr over 240 Minutes Intravenous Every 8 hours 04/05/16 1425 04/05/16 1800   04/05/16 1300  doxycycline (VIBRAMYCIN) 200 mg in dextrose 5 % 250 mL IVPB     200 mg 125 mL/hr over 120 Minutes Intravenous  Once 04/05/16 1218 04/05/16 1542      Assessment/Plan: MW tear  No further bleeding, hct essentially stable, will sign off if rebleeds endoscopy would be next step again  [facility] 04/09/2016

## 2016-04-10 ENCOUNTER — Inpatient Hospital Stay (HOSPITAL_COMMUNITY): Payer: Medicaid Other

## 2016-04-10 DIAGNOSIS — K226 Gastro-esophageal laceration-hemorrhage syndrome: Secondary | ICD-10-CM

## 2016-04-10 DIAGNOSIS — D62 Acute posthemorrhagic anemia: Secondary | ICD-10-CM

## 2016-04-10 DIAGNOSIS — K922 Gastrointestinal hemorrhage, unspecified: Secondary | ICD-10-CM

## 2016-04-10 DIAGNOSIS — A419 Sepsis, unspecified organism: Secondary | ICD-10-CM

## 2016-04-10 LAB — GLUCOSE, CAPILLARY
GLUCOSE-CAPILLARY: 85 mg/dL (ref 65–99)
GLUCOSE-CAPILLARY: 90 mg/dL (ref 65–99)
GLUCOSE-CAPILLARY: 68 mg/dL (ref 65–99)
Glucose-Capillary: 84 mg/dL (ref 65–99)
Glucose-Capillary: 78 mg/dL (ref 65–99)
Glucose-Capillary: 70 mg/dL (ref 65–99)

## 2016-04-10 LAB — BASIC METABOLIC PANEL
Anion gap: 8 (ref 5–15)
BUN: 7 mg/dL (ref 6–20)
CHLORIDE: 109 mmol/L (ref 101–111)
CO2: 24 mmol/L (ref 22–32)
CREATININE: 0.52 mg/dL (ref 0.44–1.00)
Calcium: 8.4 mg/dL — ABNORMAL LOW (ref 8.9–10.3)
GFR calc non Af Amer: >60 mL/min (ref >60)
Glucose, Bld: 96 mg/dL (ref 65–99)
Potassium: 3.2 mmol/L — ABNORMAL LOW (ref 3.5–5.1)
SODIUM: 141 mmol/L (ref 135–145)

## 2016-04-10 LAB — CBC WITH DIFFERENTIAL/PLATELET
BASOS ABS: 0 10*3/uL (ref 0.0–0.1)
Basophils Relative: 0 %
Eosinophils Absolute: 0.1 10*3/uL (ref 0.0–0.7)
Eosinophils Relative: 2 %
HEMATOCRIT: 22.8 % — AB (ref 36.0–46.0)
HEMOGLOBIN: 7.7 g/dL — AB (ref 12.0–15.0)
LYMPHS ABS: 1.3 10*3/uL (ref 0.7–4.0)
LYMPHS PCT: 16 %
MCH: 29.3 pg (ref 26.0–34.0)
MCHC: 33.8 g/dL (ref 30.0–36.0)
MCV: 86.7 fL (ref 78.0–100.0)
Monocytes Absolute: 0.5 10*3/uL (ref 0.1–1.0)
Monocytes Relative: 7 %
Neutro Abs: 5.9 10*3/uL (ref 1.7–7.7)
Neutrophils Relative %: 75 %
PLATELETS: 129 10*3/uL — AB (ref 150–400)
RBC: 2.63 MIL/uL — AB (ref 3.87–5.11)
RDW: 16.5 % — ABNORMAL HIGH (ref 11.5–15.5)
WBC: 7.8 10*3/uL (ref 4.0–10.5)

## 2016-04-10 LAB — MAGNESIUM
MAGNESIUM: 1.8 mg/dL (ref 1.7–2.4)
Magnesium: 1.7 mg/dL (ref 1.7–2.4)

## 2016-04-10 LAB — PHOSPHORUS: PHOSPHORUS: 3.5 mg/dL (ref 2.5–4.6)

## 2016-04-10 MED ORDER — FUROSEMIDE 10 MG/ML IJ SOLN
60.0000 mg | Freq: Once | INTRAMUSCULAR | Status: AC
  Administered 2016-04-10: 60 mg via INTRAVENOUS
  Filled 2016-04-10: qty 6

## 2016-04-10 NOTE — Progress Notes (Signed)
Patient transferred to 4E20 via wheelchair and tolerated well

## 2016-04-10 NOTE — Progress Notes (Signed)
PROGRESS NOTE    Carrie Cardenas  ZOX:[id number] DOB: 02/28/1991 DOA: 04/05/2016 PCP: No primary care provider on file.   Brief Narrative:  25 y.o.BF PMHx Depression, G1P1001 postpartum s/p SVD 04/01/16, complicated by preeclampsia. She recovered and was discharged. She was readmitted on 04/05/16 due to retained products of conception and had a D&C that day.    Being admitted for anemia, worsening HTN, and HA. S/P [place]astric sleeve in [place] 2 years ago She c/o heavy vaginal bleeding when she got up around 0600 this am and passed a fist sized clot. She also c/o left temporal HA that radiates to the back. She reports seeing floaters since arrival to hospital. She denies epigastric pain. She is formula feeding. She was previously admitted for pre-e, underwent IOL, given Magnesium Sulfate, and d/c'd home on PPD #3.   Subjective: 10/22  A/O 4, states just prior to giving birth fell on a bus and struck her left shin never was evaluated. Continues to have pain.    Assessment & Plan:   Active Problems:   Blood loss anemia   Retained products of conception, following delivery with hemorrhage   Secondary postpartum hemorrhage   Retained products of conception with hemorrhage   Mallory-Weiss tear   Anemia, blood loss   Bleeding   History of gastric bypass   Encounter for central line placement   Hypovolemic shock (HCC)   Acute hypoxemic respiratory failure (HCC)   Acute blood loss anemia   Upper GI bleed   Sepsis (HCC)    Upper GI bleeding r/t significant mallory weiss tear  - s/p EGD 10/19 with clips, epi, cautery  -10/21 - bleeding appears resolved. CCS signded off  Acute Blood loss anemia  -Cryo , FFP 10/19 -Goal Hgb >7   CHF? -Patient with preeclampsia at risk for CHF. Echocardiogram pending -Strict in and out since admission +9 L -Daily weight Filed Weights   04/06/16 0642 04/07/16 0648  Weight: 117.5 kg (259 lb) 114.8 kg (253 lb 1.3 oz)    Left shin bruise vs  fracture -Obtain left lower extremity x-ray  Sepsis unspecified organism/Retained products of conception With hemorrhage -S/P D&C 10/17 -Continue Zosyn will need to check with GYN on 10/23 prolonged maintain patient on current antibiotic?       DVT prophylaxis: SCD Code Status: Full Family Communication:  Disposition Plan: ??`   Consultants:  Dr. Tereso NewcomerUgonna A Anyanwu Obstetrics-Gynecology Dr.David [facility] C[facility] Dr Vida RiggerMarc Magod. GI Dr.Murali Marchelle GearingRamaswamy [facility] M    Procedures/Significant Events:  S/P transfused 5 U PRBCs  10/17 D&C for retained products of conception  EGD 10/19>>> Bleeding Mallory-Weiss tear;bicap gold probe cautery, epinephrine submucosal injection, and hemoclip.    Cultures 10/19 MRSA by PCR negative 10/19 blood central line/left AC NGTD   Antimicrobials: Zosyn 10/17>>   Devices    LINES / TUBES:  ETT 10/19>>> CVC>> 10/19>>>    Continuous Infusions:     Objective: Vitals:   04/10/16 1500 04/10/16 1600 04/10/16 1630 04/10/16 1700  BP:      Pulse: 99 72  91  Resp: 12 19    Temp:   98.2 F (36.8 C)   TempSrc:   Oral   SpO2: 100% 100%  100%  Weight:      Height:        Intake/Output Summary (Last 24 hours) at 04/10/16 1755 Last data filed at 04/10/16 1700  Gross per 24 hour  Intake             2285  ml  Output             2730 ml  Net             -445 ml   Filed Weights   04/06/16 9604 04/07/16 0648  Weight: 117.5 kg (259 lb) 114.8 kg (253 lb 1.3 oz)    Examination:  General: A/O 4, NAD, No acute respiratory distress Eyes: negative scleral hemorrhage, negative anisocoria, negative icterus ENT: Negative Runny nose, negative gingival bleeding, Neck:  Negative scars, masses, torticollis, lymphadenopathy, JVD Lungs: Clear to auscultation bilaterally without wheezes or crackles Cardiovascular: Tachycardic, Regular rhythm without murmur gallop or rub normal S1 and S2 Abdomen: Obese, negative abdominal pain, nondistended, positive  soft, bowel sounds, no rebound, no ascites, no appreciable mass Extremities: No significant cyanosis, clubbing, positive bilateral upper and lower extremity edema, positive left shin bruits  vs fracture extremely tender to palpation swollen.  Skin: Negative rashes, lesions, ulcers Psychiatric:  Negative depression, negative anxiety, negative fatigue, negative mania  Central nervous system:  Cranial nerves II through XII intact, tongue/uvula midline, all extremities muscle strength 5/5, sensation intact throughout, negative dysarthria, negative expressive aphasia, negative receptive aphasia.  .     Data Reviewed: Care during the described time interval was provided by me .  I have reviewed this patient's available data, including medical history, events of note, physical examination, and all test results as part of my evaluation. I have personally reviewed and interpreted all radiology studies.  CBC:  Recent Labs Lab 04/08/16 0215 04/08/16 0945 04/08/16 1200 04/08/16 1653 04/08/16 2312 04/09/16 0515 04/09/16 0950 04/10/16 0425  WBC 11.6* 9.3 9.5 7.7  --   --   --  7.8  NEUTROABS  --   --   --   --   --   --   --  5.9  HGB 8.3* 6.4*  6.5* 8.5* 8.0* 7.9* 7.4* 7.9* 7.7*  HCT 23.3* 18.5*  17.9* 24.3* 23.2* 22.7* 22.0* 23.4* 22.8*  MCV 84.7 84.5 85.3 84.7  --   --   --  86.7  PLT 109* 89* 87* 82*  --   --   --  129*   Basic Metabolic Panel:  Recent Labs Lab 04/05/16 0906 04/08/16 0945 04/09/16 0515 04/09/16 0950 04/10/16 0425  NA 139 143  --  140  --   K 4.4 3.4*  --  3.2*  --   CL 108 115*  --  113*  --   CO2 25 24  --  23  --   GLUCOSE 87 100*  --  88  --   BUN 9 20  --  15  --   CREATININE 0.52 0.59  --  0.55  --   CALCIUM 8.6* 8.0*  --  8.3*  --   MG  --   --  1.7  --  1.7  PHOS  --   --  4.1  --  3.5   GFR: Estimated Creatinine Clearance: 137.1 mL/min (by C-G formula based on SCr of 0.55 mg/dL). Liver Function Tests:  Recent Labs Lab 04/05/16 0906  AST 42*    ALT 45  ALKPHOS 85  BILITOT 0.6  PROT 5.5*  ALBUMIN 2.7*   No results for input(s): LIPASE, AMYLASE in the last 168 hours. No results for input(s): AMMONIA in the last 168 hours. Coagulation Profile:  Recent Labs Lab 04/05/16 1147 04/07/16 1640 04/08/16 1653  INR 1.07 1.28 1.11   Cardiac Enzymes: No results for input(s): CKTOTAL, CKMB,  CKMBINDEX, TROPONINI in the last 168 hours. BNP (last 3 results) No results for input(s): PROBNP in the last 8760 hours. HbA1C: No results for input(s): HGBA1C in the last 72 hours. CBG:  Recent Labs Lab 04/09/16 2355 04/10/16 0352 04/10/16 0751 04/10/16 1204 04/10/16 1628  GLUCAP 71 85 84 78 90   Lipid Profile: No results for input(s): CHOL, HDL, LDLCALC, TRIG, CHOLHDL, LDLDIRECT in the last 72 hours. Thyroid Function Tests: No results for input(s): TSH, T4TOTAL, FREET4, T3FREE, THYROIDAB in the last 72 hours. Anemia Panel: No results for input(s): VITAMINB12, FOLATE, FERRITIN, TIBC, IRON, RETICCTPCT in the last 72 hours. Urine analysis:    Component Value Date/Time   COLORURINE ORANGE (A) 03/30/2016 1715   APPEARANCEUR CLEAR 03/30/2016 1715   LABSPEC 1.020 03/30/2016 1715   PHURINE 6.0 03/30/2016 1715   GLUCOSEU NEGATIVE 03/30/2016 1715   HGBUR LARGE (A) 03/30/2016 1715   BILIRUBINUR SMALL (A) 03/30/2016 1715   KETONESUR 15 (A) 03/30/2016 1715   PROTEINUR 30 (A) 03/30/2016 1715   UROBILINOGEN >=8.0 02/08/2016 1617   NITRITE NEGATIVE 03/30/2016 1715   LEUKOCYTESUR SMALL (A) 03/30/2016 1715   Sepsis Labs: @LABRCNTIP (procalcitonin:4,lacticidven:4)  ) Recent Results (from the past 240 hour(s))  MRSA PCR Screening     Status: None   Collection Time: 04/07/16  7:36 PM  Result Value Ref Range Status   MRSA by PCR NEGATIVE NEGATIVE Final    Comment:        The GeneXpert MRSA Assay (FDA approved for NASAL specimens only), is one component of a comprehensive MRSA colonization surveillance program. It is not intended to  diagnose MRSA infection nor to guide or monitor treatment for MRSA infections.   Culture, blood (routine x 2)     Status: None (Preliminary result)   Collection Time: 04/07/16  8:11 PM  Result Value Ref Range Status   Specimen Description BLOOD CENTRAL LINE  Final   Special Requests BOTTLES DRAWN AEROBIC AND ANAEROBIC 5CC  Final   Culture NO GROWTH 3 DAYS  Final   Report Status PENDING  Incomplete  Culture, blood (routine x 2)     Status: None (Preliminary result)   Collection Time: 04/07/16  8:16 PM  Result Value Ref Range Status   Specimen Description BLOOD LEFT ANTECUBITAL  Final   Special Requests BOTTLES DRAWN AEROBIC AND ANAEROBIC 5CC  Final   Culture NO GROWTH 3 DAYS  Final   Report Status PENDING  Incomplete         Radiology Studies: Dg Chest Port 1 View  Result Date: 04/09/2016 CLINICAL DATA:  Respiratory failure EXAM: PORTABLE CHEST 1 VIEW COMPARISON:  04/08/2016 FINDINGS: Cardiac shadow is at the upper limits of normal in size but stable. Left jugular central line and endotracheal tube are again seen and stable. The lungs are hypoaerated but otherwise clear. No focal confluent infiltrate is seen. No bony abnormality is noted. IMPRESSION: Poor inspiratory effort although no acute abnormality is seen. Electronically Signed   By: Alcide Clever M.D.   On: 04/09/2016 07:31        Scheduled Meds: . insulin aspart  0-20 Units Subcutaneous Q4H  . [START ON 04/11/2016] pantoprazole  40 mg Intravenous Q12H  . piperacillin-tazobactam (ZOSYN)  IV  3.375 g Intravenous Q8H  . sucralfate  1 g Oral TID WC & HS   Continuous Infusions:     LOS: 5 days    Time spent: 40 minutes    Devaney Segers, Roselind Messier, MD [facility] Pager [phone]  If 7PM-7AM, please contact night-coverage [email or web address] Password [facility] 04/10/2016, 5:55 PM

## 2016-04-10 NOTE — Progress Notes (Signed)
Carrie Cardenas 8:42 AM  Subjective: Patient doing well without signs of bleeding and tolerating clear liquids  Objective: Vital signs stable afebrile no acute distress abdomen is soft nontender hemoglobin stable  Assessment: Carrie Cardenas  Plan: Tomorrow if doing well may have soft solids and hopefully home soon if well tolerated and no signs of recurrent bleeding and continue pump inhibitors at home tomorrow can change to by mouth and we discussed no aspirin or nonsteroidals for 2 weeks and Tylenol only and please call us back if we could be of any further assistance with this hospital stay and my partner Dr. Bosie ClosSchooler happy to see back when necessary  [facility],[name]  Pager [phone] After 5PM or if no answer call [phone]

## 2016-04-10 NOTE — Progress Notes (Signed)
Report called to [place] on 4E20

## 2016-04-10 NOTE — Progress Notes (Signed)
Dr Joseph ArtWoods informed of patient's increased diuresis .orders received bmet and mag labs sent to lab and IVF dc'd

## 2016-04-11 ENCOUNTER — Encounter (HOSPITAL_COMMUNITY): Payer: Self-pay | Admitting: Surgery

## 2016-04-11 ENCOUNTER — Inpatient Hospital Stay (HOSPITAL_COMMUNITY): Payer: Medicaid Other

## 2016-04-11 DIAGNOSIS — Z9889 Other specified postprocedural states: Secondary | ICD-10-CM

## 2016-04-11 DIAGNOSIS — N179 Acute kidney failure, unspecified: Secondary | ICD-10-CM

## 2016-04-11 LAB — CBC WITH DIFFERENTIAL/PLATELET
BASOS PCT: 0 %
Basophils Absolute: 0 10*3/uL (ref 0.0–0.1)
EOS ABS: 0.1 10*3/uL (ref 0.0–0.7)
EOS PCT: 1 %
HCT: 23.1 % — ABNORMAL LOW (ref 36.0–46.0)
HEMOGLOBIN: 7.9 g/dL — AB (ref 12.0–15.0)
LYMPHS ABS: 1.4 10*3/uL (ref 0.7–4.0)
Lymphocytes Relative: 16 %
MCH: 29.6 pg (ref 26.0–34.0)
MCHC: 34.2 g/dL (ref 30.0–36.0)
MCV: 86.5 fL (ref 78.0–100.0)
MONO ABS: 0.7 10*3/uL (ref 0.1–1.0)
MONOS PCT: 8 %
NEUTROS PCT: 75 %
Neutro Abs: 6.5 10*3/uL (ref 1.7–7.7)
PLATELETS: 166 10*3/uL (ref 150–400)
RBC: 2.67 MIL/uL — ABNORMAL LOW (ref 3.87–5.11)
RDW: 16.2 % — AB (ref 11.5–15.5)
WBC: 8.6 10*3/uL (ref 4.0–10.5)

## 2016-04-11 LAB — TYPE AND SCREEN
ABO/RH(D): A POS
ANTIBODY SCREEN: NEGATIVE
UNIT DIVISION: 0
UNIT DIVISION: 0
UNIT DIVISION: 0
Unit division: 0
Unit division: 0
Unit division: 0

## 2016-04-11 LAB — GLUCOSE, CAPILLARY
GLUCOSE-CAPILLARY: 77 mg/dL (ref 65–99)
GLUCOSE-CAPILLARY: 77 mg/dL (ref 65–99)
GLUCOSE-CAPILLARY: 85 mg/dL (ref 65–99)
Glucose-Capillary: 92 mg/dL (ref 65–99)
Glucose-Capillary: 87 mg/dL (ref 65–99)
Glucose-Capillary: 86 mg/dL (ref 65–99)

## 2016-04-11 LAB — BASIC METABOLIC PANEL
Anion gap: 5 (ref 5–15)
BUN: 5 mg/dL — AB (ref 6–20)
CALCIUM: 8.1 mg/dL — AB (ref 8.9–10.3)
CHLORIDE: 109 mmol/L (ref 101–111)
CO2: 27 mmol/L (ref 22–32)
CREATININE: 0.53 mg/dL (ref 0.44–1.00)
Glucose, Bld: 87 mg/dL (ref 65–99)
Potassium: 2.9 mmol/L — ABNORMAL LOW (ref 3.5–5.1)
SODIUM: 141 mmol/L (ref 135–145)

## 2016-04-11 LAB — ECHOCARDIOGRAM COMPLETE
HEIGHTINCHES: 65 in
Weight: 4243.41 oz

## 2016-04-11 LAB — PHOSPHORUS: Phosphorus: 3.5 mg/dL (ref 2.5–4.6)

## 2016-04-11 LAB — MAGNESIUM: MAGNESIUM: 1.5 mg/dL — AB (ref 1.7–2.4)

## 2016-04-11 MED ORDER — MAGNESIUM SULFATE 50 % IJ SOLN
3.0000 g | Freq: Once | INTRAVENOUS | Status: AC
  Administered 2016-04-11: 3 g via INTRAVENOUS
  Filled 2016-04-11: qty 6

## 2016-04-11 MED ORDER — FUROSEMIDE 10 MG/ML PO SOLN
40.0000 mg | Freq: Every day | ORAL | Status: DC
  Administered 2016-04-11: 40 mg via ORAL
  Filled 2016-04-11 (×2): qty 4

## 2016-04-11 MED ORDER — POTASSIUM CHLORIDE 20 MEQ/15ML (10%) PO SOLN
60.0000 meq | Freq: Once | ORAL | Status: AC
  Administered 2016-04-11: 60 meq via ORAL
  Filled 2016-04-11: qty 45

## 2016-04-11 MED ORDER — METOPROLOL TARTRATE 12.5 MG HALF TABLET
12.5000 mg | ORAL_TABLET | Freq: Two times a day (BID) | ORAL | Status: DC
  Administered 2016-04-11 – 2016-04-13 (×4): 12.5 mg via ORAL
  Filled 2016-04-11 (×4): qty 1

## 2016-04-11 NOTE — Progress Notes (Signed)
MD notfied K 2.9, Mg 1.5. New orders to replace both and leave foley in place for diuresis. Will continue to monitor patient closely.

## 2016-04-11 NOTE — Progress Notes (Signed)
  Echocardiogram 2D Echocardiogram has been performed.  Janalyn HarderWest, Zykeem Bauserman R 04/11/2016, 2:50 PM

## 2016-04-11 NOTE — Progress Notes (Signed)
PROGRESS NOTE    Carrie Cardenas  WUJ:[id number] DOB: Jan 25, 1991 DOA: 04/05/2016 PCP: No primary care provider on file.   Brief Narrative:  25 y.o.BF PMHx Depression, G1P1001 postpartum s/p SVD 04/01/16, complicated by preeclampsia. She recovered and was discharged. She was readmitted on 04/05/16 due to retained products of conception and had a D&C that day.    Being admitted for anemia, worsening HTN, and HA. S/P Gastric sleeve in [place] 2 years ago She c/o heavy vaginal bleeding when she got up around 0600 this am and passed a fist sized clot. She also c/o left temporal HA that radiates to the back. She reports seeing floaters since arrival to hospital. She denies epigastric pain. She is formula feeding. She was previously admitted for pre-e, underwent IOL, given Magnesium Sulfate, and d/c'd home on PPD #3.   Subjective: 10/23  A/O 4, states just prior to giving birth fell on a bus and struck her left shin never was evaluated. Continues to have pain. States continues to have swelling which has been unrelieved by her Lasix. Has only ambulated to the bathroom and back.    Assessment & Plan:   Active Problems:   Blood loss anemia   Retained products of conception, following delivery with hemorrhage   Secondary postpartum hemorrhage   Retained products of conception with hemorrhage   Mallory-Weiss tear   Anemia, blood loss   Bleeding   History of gastric bypass   Encounter for central line placement   Hypovolemic shock (HCC)   Acute hypoxemic respiratory failure (HCC)   Acute blood loss anemia   Upper GI bleed   Sepsis (HCC)    Upper GI bleeding r/t significant mallory weiss tear  - s/p EGD 10/19 with clips, epi, cautery  -10/21 - bleeding appears resolved. CCS signded off  -Ambulate patient in hallway  Acute Blood loss anemia  -Cryo , FFP 10/19 -Goal Hgb >7   CHF/HTN -Patient with preeclampsia at risk for CHF. Echocardiogram pending -Strict in and out since admission  + 6.6 L -Daily weight Filed Weights   04/07/16 0648 04/10/16 1800 04/11/16 0405  Weight: 114.8 kg (253 lb 1.3 oz) 123.2 kg (271 lb 9.7 oz) 120.3 kg (265 lb 3.4 oz)  -Lasix 40 mg daily -Echocardiogram: Back consistent with CHF see results below -Metoprolol 12.5 mg BID  Pulmonary hypertension -See CHF  Left shin bruise vs fracture -Obtain left lower extremity x-ray  Sepsis unspecified organism/Retained products of conception With hemorrhage -S/P D&C 10/17 -Continue Zosyn will need to check with GYN on 10/24 prolonged maintain patient on current antibiotic? Contact Dr. Tereso Newcomer Obstetrics-Gynecology  Hypokalemia -Potassium 60 mEq  Hypomagnesemia -Magnesium IV 3 g     DVT prophylaxis: SCD Code Status: Full Family Communication:  Disposition Plan: ??`   Consultants:  Dr. Tereso Newcomer Obstetrics-Gynecology Dr.[facility] CCS Dr Vida Rigger. GI Dr.Murali Marchelle Gearing [facility] M    Procedures/Significant Events:  S/P transfused 5 U PRBCs  10/17 D&C for retained products of conception  EGD 10/19>>> Bleeding Mallory-Weiss tear;bicap gold probe cautery, epinephrine submucosal injection, and hemoclip.  10/22 tibia/fibula x-ray: Normal 10/23 Echocardiogram: LVEF=: 60% to 65%. -Pulmonary arteries: PA peak pressure: 39 mm Hg (S). - Pericardium, small pericardial effusion    Cultures 10/19 MRSA by PCR negative 10/19 blood central line/left AC NGTD   Antimicrobials: Zosyn 10/17>>   Devices    LINES / TUBES:  ETT 10/19>>> CVC>> 10/19>>>    Continuous Infusions:     Objective: Vitals:  04/11/16 0400 04/11/16 0405 04/11/16 0500 04/11/16 0839  BP: (!) 134/91  (!) 131/93 (!) 133/96  Pulse: 89  89   Resp: 17  (!) 0   Temp:    98.7 F (37.1 C)  TempSrc:    Oral  SpO2: 100%  99%   Weight:  120.3 kg (265 lb 3.4 oz)    Height:        Intake/Output Summary (Last 24 hours) at 04/11/16 0932 Last data filed at 04/11/16 0355  Gross per 24 hour    Intake              820 ml  Output             4915 ml  Net            -4095 ml   Filed Weights   04/07/16 0648 04/10/16 1800 04/11/16 0405  Weight: 114.8 kg (253 lb 1.3 oz) 123.2 kg (271 lb 9.7 oz) 120.3 kg (265 lb 3.4 oz)    Examination:  General: A/O 4, NAD, No acute respiratory distress Eyes: negative scleral hemorrhage, negative anisocoria, negative icterus ENT: Negative Runny nose, negative gingival bleeding, Neck:  Negative scars, masses, torticollis, lymphadenopathy, JVD Lungs: Clear to auscultation bilaterally without wheezes or crackles Cardiovascular: Tachycardic, Regular rhythm without murmur gallop or rub normal S1 and S2 Abdomen: Obese, negative abdominal pain, nondistended, positive soft, bowel sounds, no rebound, no ascites, no appreciable mass Extremities: No significant cyanosis, clubbing, positive bilateral upper and lower extremity edema, positive left shin bruits  vs fracture extremely tender to palpation swollen.  Skin: Negative rashes, lesions, ulcers Psychiatric:  Negative depression, negative anxiety, negative fatigue, negative mania  Central nervous system:  Cranial nerves II through XII intact, tongue/uvula midline, all extremities muscle strength 5/5, sensation intact throughout, negative dysarthria, negative expressive aphasia, negative receptive aphasia.  .     Data Reviewed: Care during the described time interval was provided by me .  I have reviewed this patient's available data, including medical history, events of note, physical examination, and all test results as part of my evaluation. I have personally reviewed and interpreted all radiology studies.  CBC:  Recent Labs Lab 04/08/16 0945 04/08/16 1200 04/08/16 1653 04/08/16 2312 04/09/16 0515 04/09/16 0950 04/10/16 0425 04/11/16 0416  WBC 9.3 9.5 7.7  --   --   --  7.8 8.6  NEUTROABS  --   --   --   --   --   --  5.9 6.5  HGB 6.4*  6.5* 8.5* 8.0* 7.9* 7.4* 7.9* 7.7* 7.9*  HCT 18.5*   17.9* 24.3* 23.2* 22.7* 22.0* 23.4* 22.8* 23.1*  MCV 84.5 85.3 84.7  --   --   --  86.7 86.5  PLT 89* 87* 82*  --   --   --  129* 166   Basic Metabolic Panel:  Recent Labs Lab 04/05/16 0906 04/08/16 0945 04/09/16 0515 04/09/16 0950 04/10/16 0425 04/10/16 1850 04/11/16 0416  NA 139 143  --  140  --  141 141  K 4.4 3.4*  --  3.2*  --  3.2* 2.9*  CL 108 115*  --  113*  --  109 109  CO2 25 24  --  23  --  24 27  GLUCOSE 87 100*  --  88  --  96 87  BUN 9 20  --  15  --  7 5*  CREATININE 0.52 0.59  --  0.55  --  0.52 0.53  CALCIUM 8.6* 8.0*  --  8.3*  --  8.4* 8.1*  MG  --   --  1.7  --  1.7 1.8 1.5*  PHOS  --   --  4.1  --  3.5  --  3.5   GFR: Estimated Creatinine Clearance: 140.9 mL/min (by C-G formula based on SCr of 0.53 mg/dL). Liver Function Tests:  Recent Labs Lab 04/05/16 0906  AST 42*  ALT 45  ALKPHOS 85  BILITOT 0.6  PROT 5.5*  ALBUMIN 2.7*   No results for input(s): LIPASE, AMYLASE in the last 168 hours. No results for input(s): AMMONIA in the last 168 hours. Coagulation Profile:  Recent Labs Lab 04/05/16 1147 04/07/16 1640 04/08/16 1653  INR 1.07 1.28 1.11   Cardiac Enzymes: No results for input(s): CKTOTAL, CKMB, CKMBINDEX, TROPONINI in the last 168 hours. BNP (last 3 results) No results for input(s): PROBNP in the last 8760 hours. HbA1C: No results for input(s): HGBA1C in the last 72 hours. CBG:  Recent Labs Lab 04/10/16 2119 04/10/16 2200 04/11/16 0043 04/11/16 0403 04/11/16 0842  GLUCAP 68 70 77 77 85   Lipid Profile: No results for input(s): CHOL, HDL, LDLCALC, TRIG, CHOLHDL, LDLDIRECT in the last 72 hours. Thyroid Function Tests: No results for input(s): TSH, T4TOTAL, FREET4, T3FREE, THYROIDAB in the last 72 hours. Anemia Panel: No results for input(s): VITAMINB12, FOLATE, FERRITIN, TIBC, IRON, RETICCTPCT in the last 72 hours. Urine analysis:    Component Value Date/Time   COLORURINE ORANGE (A) 03/30/2016 1715   APPEARANCEUR  CLEAR 03/30/2016 1715   LABSPEC 1.020 03/30/2016 1715   PHURINE 6.0 03/30/2016 1715   GLUCOSEU NEGATIVE 03/30/2016 1715   HGBUR LARGE (A) 03/30/2016 1715   BILIRUBINUR SMALL (A) 03/30/2016 1715   KETONESUR 15 (A) 03/30/2016 1715   PROTEINUR 30 (A) 03/30/2016 1715   UROBILINOGEN >=8.0 02/08/2016 1617   NITRITE NEGATIVE 03/30/2016 1715   LEUKOCYTESUR SMALL (A) 03/30/2016 1715   Sepsis Labs: @LABRCNTIP (procalcitonin:4,lacticidven:4)  ) Recent Results (from the past 240 hour(s))  MRSA PCR Screening     Status: None   Collection Time: 04/07/16  7:36 PM  Result Value Ref Range Status   MRSA by PCR NEGATIVE NEGATIVE Final    Comment:        The GeneXpert MRSA Assay (FDA approved for NASAL specimens only), is one component of a comprehensive MRSA colonization surveillance program. It is not intended to diagnose MRSA infection nor to guide or monitor treatment for MRSA infections.   Culture, blood (routine x 2)     Status: None (Preliminary result)   Collection Time: 04/07/16  8:11 PM  Result Value Ref Range Status   Specimen Description BLOOD CENTRAL LINE  Final   Special Requests BOTTLES DRAWN AEROBIC AND ANAEROBIC 5CC  Final   Culture NO GROWTH 3 DAYS  Final   Report Status PENDING  Incomplete  Culture, blood (routine x 2)     Status: None (Preliminary result)   Collection Time: 04/07/16  8:16 PM  Result Value Ref Range Status   Specimen Description BLOOD LEFT ANTECUBITAL  Final   Special Requests BOTTLES DRAWN AEROBIC AND ANAEROBIC 5CC  Final   Culture NO GROWTH 3 DAYS  Final   Report Status PENDING  Incomplete         Radiology Studies: Dg Tibia/fibula Left Port  Result Date: 04/10/2016 CLINICAL DATA:  Fall on a bus. Severe bruise or fracture in the left lower extremity. Initial encounter. EXAM: PORTABLE LEFT TIBIA AND FIBULA -  2 VIEW COMPARISON:  None. FINDINGS: There is soft tissue swelling in the anterior lower leg. The tibia and fibula appear intact without  evidence fracture. The knee and ankle are grossly located. No focal osseous lesion or radiopaque foreign body is seen. IMPRESSION: Soft tissue swelling without evidence of acute osseous abnormality. Electronically Signed   By: Sebastian AcheAllen  Grady M.D.   On: 04/10/2016 19:05        Scheduled Meds: . insulin aspart  0-20 Units Subcutaneous Q4H  . pantoprazole  40 mg Intravenous Q12H  . piperacillin-tazobactam (ZOSYN)  IV  3.375 g Intravenous Q8H  . sucralfate  1 g Oral TID WC & HS   Continuous Infusions:     LOS: 6 days    Time spent: 40 minutes    Carrie Cardenas, Roselind MessierURTIS J, MD [facility] Pager [phone]   If 7PM-7AM, please contact night-coverage [email or web address] Password [email or web address] 04/11/2016, 9:32 AM

## 2016-04-11 NOTE — Progress Notes (Signed)
Nutrition Follow Up  DOCUMENTATION CODES:   Morbid obesity  INTERVENTION:    Continue Soft diet  NUTRITION DIAGNOSIS:   Inadequate oral intake related to inability to eat as evidenced by NPO status, resolved  GOAL:   Patient will meet greater than or equal to 90% of their needs, progressing  MONITOR:   PO intake, Labs, Weight trends, Skin, I & O's  ASSESSMENT:   [name] with past medical history of obesity, depression and prior gastric sleeve [place] in [place] approximately 2 years ago. She underwent vaginal birth on 04/01/16 which was complicated by preeclampsia. [facility]. On 10/17 she was readmitted with retained products of conception and underwent D&C. On 10/18 she re-presented with hematemesis, and black stools and underwent transfusion  Patient extubated 10/21. Diet advanced to Soft this AM. PO intake 50% (on Clear Liquids) per flowsheet records. GI note reviewed >> no signs of bleeding. Hopefully discharging home soon.  Diet Order:  DIET SOFT Room service appropriate? Yes; Fluid consistency: Thin  Skin:  Reviewed, no issues  Last BM:  10/23   CBG (last 3)   Recent Labs  04/11/16 0043 04/11/16 0403 04/11/16 0842  GLUCAP 77 77 85   Height:   Ht Readings from Last 1 Encounters:  04/10/16 5\' 5"  (1.651 m)    Weight:   Wt Readings from Last 1 Encounters:  04/11/16 265 lb 3.4 oz (120.3 kg)    Ideal Body Weight:  56.8 kg  BMI:  Body mass index is 44.13 kg/m.  Estimated Nutritional Needs:   Kcal:  2000-2200  Protein:  100-110 gm  Fluid:  2.0-2.2 L  EDUCATION NEEDS:   No education needs identified at this time  Carrie ChattersKatie Tanuj Cardenas, RD, LDN Pager #: [phone] After-Hours Pager #: [phone]

## 2016-04-12 LAB — CBC WITH DIFFERENTIAL/PLATELET
Basophils Absolute: 0 10*3/uL (ref 0.0–0.1)
Basophils Relative: 0 %
EOS PCT: 1 %
Eosinophils Absolute: 0.1 10*3/uL (ref 0.0–0.7)
HCT: 21.2 % — ABNORMAL LOW (ref 36.0–46.0)
Hemoglobin: 7 g/dL — ABNORMAL LOW (ref 12.0–15.0)
LYMPHS ABS: 1.2 10*3/uL (ref 0.7–4.0)
LYMPHS PCT: 14 %
MCH: 29.4 pg (ref 26.0–34.0)
MCHC: 33 g/dL (ref 30.0–36.0)
MCV: 89.1 fL (ref 78.0–100.0)
MONO ABS: 0.6 10*3/uL (ref 0.1–1.0)
MONOS PCT: 7 %
Neutro Abs: 6.3 10*3/uL (ref 1.7–7.7)
Neutrophils Relative %: 78 %
PLATELETS: 161 10*3/uL (ref 150–400)
RBC: 2.38 MIL/uL — AB (ref 3.87–5.11)
RDW: 16.5 % — AB (ref 11.5–15.5)
WBC: 8.1 10*3/uL (ref 4.0–10.5)

## 2016-04-12 LAB — GLUCOSE, CAPILLARY
Glucose-Capillary: 83 mg/dL (ref 65–99)
Glucose-Capillary: 81 mg/dL (ref 65–99)
Glucose-Capillary: 84 mg/dL (ref 65–99)

## 2016-04-12 LAB — BASIC METABOLIC PANEL
Anion gap: 7 (ref 5–15)
CO2: 24 mmol/L (ref 22–32)
CREATININE: 0.58 mg/dL (ref 0.44–1.00)
Calcium: 7.7 mg/dL — ABNORMAL LOW (ref 8.9–10.3)
Chloride: 108 mmol/L (ref 101–111)
GFR calc Af Amer: >60 mL/min (ref >60)
GLUCOSE: 78 mg/dL (ref 65–99)
POTASSIUM: 3.1 mmol/L — AB (ref 3.5–5.1)
Sodium: 139 mmol/L (ref 135–145)

## 2016-04-12 LAB — CULTURE, BLOOD (ROUTINE X 2)
Culture: NO GROWTH
Culture: NO GROWTH

## 2016-04-12 LAB — MAGNESIUM: Magnesium: 1.8 mg/dL (ref 1.7–2.4)

## 2016-04-12 LAB — PHOSPHORUS: Phosphorus: 3.4 mg/dL (ref 2.5–4.6)

## 2016-04-12 MED ORDER — FUROSEMIDE 40 MG PO TABS
40.0000 mg | ORAL_TABLET | Freq: Every day | ORAL | Status: DC
  Administered 2016-04-12: 40 mg via ORAL
  Filled 2016-04-12: qty 1

## 2016-04-12 MED ORDER — POTASSIUM CHLORIDE CRYS ER 20 MEQ PO TBCR
40.0000 meq | EXTENDED_RELEASE_TABLET | Freq: Three times a day (TID) | ORAL | Status: AC
  Administered 2016-04-12 – 2016-04-13 (×3): 40 meq via ORAL
  Filled 2016-04-12 (×3): qty 2

## 2016-04-12 MED ORDER — PANTOPRAZOLE SODIUM 40 MG PO TBEC
40.0000 mg | DELAYED_RELEASE_TABLET | Freq: Every day | ORAL | Status: DC
  Administered 2016-04-13: 40 mg via ORAL
  Filled 2016-04-12: qty 1

## 2016-04-12 NOTE — Progress Notes (Signed)
TEAM 1 - Stepdown/ICU TEAM  Carrie Cardenas  WUJ:[id number] DOB: 1990-07-19 DOA: 04/05/2016 PCP: No primary care provider on file.    Brief Narrative:  25 y.o.F Hx Depression, morbid obesity s/p gastric sleeve in [place] in 2015, G1P1001 postpartum s/p SVD 04/01/16 complicated by preeclampsia. She recovered and was discharged. She was readmitted on 04/05/16 due to retained products of conception and had a D&C that day.    Significant Events: 10/17 admit by OB/GYN - D&C for retained products of conception - required multiple units of FFP and PRBC  10/19 began vomiting w/ hematemesis > transferred to [facility] > EGD 10/19 Bleeding Mallory-Weiss tear; bicap gold probe cautery, epinephrine submucosal injection, and hemoclip 10/19 intubated during EGD - to ICU 10/20 repeat EGD - no bleeding from tear noted   Subjective: The patient is sitting up comfortably beside the bed.  She denies any clear evidence of ongoing blood loss.  She denies shortness of breath lightheadedness fevers chills or chest pain.  She has tolerated her diet thus far today.  Assessment & Plan:  Upper GI bleeding due to mallory weiss tear  -EGD 10/19 with clips, epi, cautery  -10/20 EGD - bleeding appears resolved  Acute Blood loss anemia  Hgb may be drifting down again, or possibly just equilibrating - cont to follow trend   Recent Labs Lab 04/09/16 0515 04/09/16 0950 04/10/16 0425 04/11/16 0416 04/12/16 0555  HGB 7.4* 7.9* 7.7* 7.9* 7.0*   Sepsis v/s SIRS / Retained products of conception with hemorrhage -S/P D&C 10/17 -Continue Zosyn for now - will need to check with GYN before d/c regarding planned course of abx tx   HTN -TTE w/o evidence of CHF w/ preserved EF and normal LV wall thickness - BP reasonably well controlled   Pulmonary hypertension -possibly related to recent pre-eclampsia or morbid obesity - mildly/modestly elevated at 39mm Hg - needs to be followed as outpt   Left shin bruise    -left lower extremity x-ray w/o acute findings   Hypokalemia -replace and follow   Hypomagnesemia -replaced   Obesity - Body mass index is 44.13 kg/m.  DVT prophylaxis: SCDs Code Status: FULL CODE Family Communication: no family present at time of exam  Disposition Plan: Probable discharge home 10/25 and hemoglobin stable  Consultants:  Dr. Tereso Newcomer Obstetrics-Gynecology Dr.[facility] CCS Dr Vida Rigger. GI Dr.Murali Ramaswamy PCCM  Antimicrobials:  Zosyn 10/17 >  Objective: Blood pressure 120/82, pulse 85, temperature 99.7 F (37.6 C), temperature source Oral, resp. rate 17, height 5\' 5"  (1.651 m), weight 120.3 kg (265 lb 3.4 oz), SpO2 98 %, currently breastfeeding.  Intake/Output Summary (Last 24 hours) at 04/12/16 1655 Last data filed at 04/12/16 1628  Gross per 24 hour  Intake              508 ml  Output             3000 ml  Net            -2492 ml   Filed Weights   04/07/16 0648 04/10/16 1800 04/11/16 0405  Weight: 114.8 kg (253 lb 1.3 oz) 123.2 kg (271 lb 9.7 oz) 120.3 kg (265 lb 3.4 oz)    Examination: General: No acute respiratory distress Lungs: Clear to auscultation bilaterally without wheezes or crackles Cardiovascular: Regular rate and rhythm without murmur gallop or rub normal S1 and S2 Abdomen: Nontender, nondistended, soft, bowel sounds positive, no rebound, no ascites, no appreciable mass Extremities: No significant  cyanosis, clubbing, or edema bilateral lower extremities  CBC:  Recent Labs Lab 04/08/16 1200 04/08/16 1653  04/09/16 0515 04/09/16 0950 04/10/16 0425 04/11/16 0416 04/12/16 0555  WBC 9.5 7.7  --   --   --  7.8 8.6 8.1  NEUTROABS  --   --   --   --   --  5.9 6.5 6.3  HGB 8.5* 8.0*  < > 7.4* 7.9* 7.7* 7.9* 7.0*  HCT 24.3* 23.2*  < > 22.0* 23.4* 22.8* 23.1* 21.2*  MCV 85.3 84.7  --   --   --  86.7 86.5 89.1  PLT 87* 82*  --   --   --  129* 166 161  < > = values in this interval not displayed. Basic Metabolic  Panel:  Recent Labs Lab 04/08/16 0945 04/09/16 0515 04/09/16 0950 04/10/16 0425 04/10/16 1850 04/11/16 0416 04/12/16 0555  NA 143  --  140  --  141 141 139  K 3.4*  --  3.2*  --  3.2* 2.9* 3.1*  CL 115*  --  113*  --  109 109 108  CO2 24  --  23  --  24 27 24   GLUCOSE 100*  --  88  --  96 87 78  BUN 20  --  15  --  7 5* <5*  CREATININE 0.59  --  0.55  --  0.52 0.53 0.58  CALCIUM 8.0*  --  8.3*  --  8.4* 8.1* 7.7*  MG  --  1.7  --  1.7 1.8 1.5* 1.8  PHOS  --  4.1  --  3.5  --  3.5 3.4   GFR: Estimated Creatinine Clearance: 140.9 mL/min (by C-G formula based on SCr of 0.58 mg/dL).  Liver Function Tests: No results for input(s): AST, ALT, ALKPHOS, BILITOT, PROT, ALBUMIN in the last 168 hours. No results for input(s): LIPASE, AMYLASE in the last 168 hours. No results for input(s): AMMONIA in the last 168 hours.  Coagulation Profile:  Recent Labs Lab 04/07/16 1640 04/08/16 1653  INR 1.28 1.11    HbA1C: Hgb A1c MFr Bld  Date/Time Value Ref Range Status  04/18/2008 06:50 AM   Final   5.3 (NOTE)   The ADA recommends the following therapeutic goal for glycemic   control related to Hgb A1C measurement:   Goal of Therapy:   < 7.0% Hgb A1C   Reference: American Diabetes Association: Clinical Practice   Recommendations 2008, Diabetes Care,  2008, 31:(Suppl 1).    CBG:  Recent Labs Lab 04/11/16 2038 04/11/16 2329 04/12/16 0319 04/12/16 0833 04/12/16 1146  GLUCAP 87 86 83 81 84    Recent Results (from the past 240 hour(s))  MRSA PCR Screening     Status: None   Collection Time: 04/07/16  7:36 PM  Result Value Ref Range Status   MRSA by PCR NEGATIVE NEGATIVE Final    Comment:        The GeneXpert MRSA Assay (FDA approved for NASAL specimens only), is one component of a comprehensive MRSA colonization surveillance program. It is not intended to diagnose MRSA infection nor to guide or monitor treatment for MRSA infections.   Culture, blood (routine x 2)      Status: None   Collection Time: 04/07/16  8:11 PM  Result Value Ref Range Status   Specimen Description BLOOD CENTRAL LINE  Final   Special Requests BOTTLES DRAWN AEROBIC AND ANAEROBIC 5CC  Final   Culture NO GROWTH 5 DAYS  Final   Report Status 04/12/2016 FINAL  Final  Culture, blood (routine x 2)     Status: None   Collection Time: 04/07/16  8:16 PM  Result Value Ref Range Status   Specimen Description BLOOD LEFT ANTECUBITAL  Final   Special Requests BOTTLES DRAWN AEROBIC AND ANAEROBIC 5CC  Final   Culture NO GROWTH 5 DAYS  Final   Report Status 04/12/2016 FINAL  Final     Scheduled Meds: . furosemide  40 mg Oral Daily  . insulin aspart  0-20 Units Subcutaneous Q4H  . metoprolol tartrate  12.5 mg Oral BID  . pantoprazole  40 mg Intravenous Q12H  . piperacillin-tazobactam (ZOSYN)  IV  3.375 g Intravenous Q8H  . sucralfate  1 g Oral TID WC & HS     LOS: 7 days   Lonia Blood, MD [facility] Office  [phone] Pager - Text Page per Amion as per below:  On-Call/Text Page:      [email or web address]      password [email or web address]  If 7PM-7AM, please contact night-coverage [email or web address] Password [email or web address] 04/12/2016, 4:55 PM

## 2016-04-12 NOTE — Progress Notes (Signed)
Patient had a small amount of pale pink drainage on her bed pad.  Checked around catheter which seems to be the source of drainage.  Performed peri care.  No blood per rectum when wiped.  Patient is not able to recall whether catheter was tugged at some point.  Have re-secured catheter.  Will continue to monitor.

## 2016-04-13 DIAGNOSIS — K922 Gastrointestinal hemorrhage, unspecified: Secondary | ICD-10-CM

## 2016-04-13 LAB — COMPREHENSIVE METABOLIC PANEL
ALK PHOS: 48 U/L (ref 38–126)
ALT: 12 U/L — AB (ref 14–54)
AST: 13 U/L — AB (ref 15–41)
Albumin: 1.9 g/dL — ABNORMAL LOW (ref 3.5–5.0)
Anion gap: 6 (ref 5–15)
BILIRUBIN TOTAL: 0.6 mg/dL (ref 0.3–1.2)
CHLORIDE: 109 mmol/L (ref 101–111)
CO2: 26 mmol/L (ref 22–32)
CREATININE: 0.56 mg/dL (ref 0.44–1.00)
Calcium: 8.1 mg/dL — ABNORMAL LOW (ref 8.9–10.3)
GFR calc Af Amer: >60 mL/min (ref >60)
Glucose, Bld: 83 mg/dL (ref 65–99)
Potassium: 3.5 mmol/L (ref 3.5–5.1)
Sodium: 141 mmol/L (ref 135–145)
Total Protein: 4 g/dL — ABNORMAL LOW (ref 6.5–8.1)

## 2016-04-13 LAB — CBC
HEMATOCRIT: 22 % — AB (ref 36.0–46.0)
HEMOGLOBIN: 7.2 g/dL — AB (ref 12.0–15.0)
MCH: 28.9 pg (ref 26.0–34.0)
MCHC: 32.7 g/dL (ref 30.0–36.0)
MCV: 88.4 fL (ref 78.0–100.0)
PLATELETS: 216 10*3/uL (ref 150–400)
RBC: 2.49 MIL/uL — AB (ref 3.87–5.11)
RDW: 16 % — ABNORMAL HIGH (ref 11.5–15.5)
WBC: 7.6 10*3/uL (ref 4.0–10.5)

## 2016-04-13 LAB — OCCULT BLOOD X 1 CARD TO LAB, STOOL: FECAL OCCULT BLD: POSITIVE — AB

## 2016-04-13 MED ORDER — ONDANSETRON HCL 4 MG PO TABS: 4.0000 mg | ORAL_TABLET | Freq: Four times a day (QID) | ORAL | 0 refills | Status: DC | PRN

## 2016-04-13 MED ORDER — AMOXICILLIN-POT CLAVULANATE 875-125 MG PO TABS: 1.0000 | ORAL_TABLET | Freq: Two times a day (BID) | ORAL | 0 refills | Status: DC

## 2016-04-13 MED ORDER — PANTOPRAZOLE SODIUM 40 MG PO TBEC: 40.0000 mg | DELAYED_RELEASE_TABLET | Freq: Every day | ORAL | 0 refills | Status: DC

## 2016-04-13 MED ORDER — METOPROLOL TARTRATE 25 MG PO TABS: 12.5000 mg | ORAL_TABLET | Freq: Two times a day (BID) | ORAL | 0 refills | Status: DC

## 2016-04-13 MED ORDER — AMOXICILLIN-POT CLAVULANATE 875-125 MG PO TABS
1.0000 | ORAL_TABLET | Freq: Two times a day (BID) | ORAL | Status: DC
  Administered 2016-04-13: 1 via ORAL
  Filled 2016-04-13 (×2): qty 1

## 2016-04-13 MED ORDER — SUCRALFATE 1 GM/10ML PO SUSP: 1.0000 g | Freq: Three times a day (TID) | ORAL | 0 refills | Status: DC
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
## 2016-04-13 NOTE — Progress Notes (Signed)
Discharge Note:  Patient alert and oriented X 4 and in no distress.  Patient given discharge instructions regarding signs and symptoms to report, medication, diet, activity, and upcoming appointments.  She verbalized understanding of all instructions.  Telemetry discontinued.  Site of Left IJ triple lumen catheter removal clean, dry, and intact. Patient confirmed that she had all of her personal belongings.  She was transported out via wheelchair by NT.

## 2016-04-20 ENCOUNTER — Ambulatory Visit: Payer: Self-pay | Admitting: Obstetrics & Gynecology

## 2016-05-11 ENCOUNTER — Ambulatory Visit (INDEPENDENT_AMBULATORY_CARE_PROVIDER_SITE_OTHER): Payer: Medicaid Other | Admitting: Family Medicine

## 2016-05-11 ENCOUNTER — Ambulatory Visit (INDEPENDENT_AMBULATORY_CARE_PROVIDER_SITE_OTHER): Payer: Medicaid Other | Admitting: Clinical

## 2016-05-11 ENCOUNTER — Encounter: Payer: Self-pay | Admitting: Family Medicine

## 2016-05-11 DIAGNOSIS — O99345 Other mental disorders complicating the puerperium: Secondary | ICD-10-CM

## 2016-05-11 DIAGNOSIS — F53 Postpartum depression: Secondary | ICD-10-CM

## 2016-05-11 DIAGNOSIS — F4321 Adjustment disorder with depressed mood: Secondary | ICD-10-CM | POA: Diagnosis not present

## 2016-05-11 DIAGNOSIS — O1002 Pre-existing essential hypertension complicating childbirth: Secondary | ICD-10-CM

## 2016-05-11 MED ORDER — AMLODIPINE BESYLATE 5 MG PO TABS: 5.0000 mg | ORAL_TABLET | Freq: Every day | ORAL | 1 refills | Status: DC

## 2016-05-11 NOTE — Progress Notes (Signed)
Subjective:     Carrie Cardenas is a 25 y.o. female who presents for a postpartum visit. She is 6 weeks postpartum following a spontaneous vaginal delivery. I have fully reviewed the prenatal and intrapartum course. The delivery was at 38 gestational weeks. Outcome: spontaneous vaginal delivery. Anesthesia: epidural. Postpartum course has been remarkable for preeclampsia and D&C for retained products. Baby's course has been unremarkable. Baby is feeding by bottle - Similac Pro Advance. Bleeding red. Bowel function is normal. Bladder function is normal. Patient is not sexually active. Contraception method is none. Postpartum depression screening: negative.  The following portions of the patient's history were reviewed and updated as appropriate: allergies, current medications, past family history, past medical history, past social history, past surgical history and problem list.  Review of Systems Pertinent items are noted in HPI.   Objective:    BP (!) 158/105 (BP Location: Right Arm, Patient Position: Sitting, Cuff Size: Large)   Pulse 75   Wt 228 lb 14.4 oz (103.8 kg)   LMP 05/11/2016 (Exact Date)   BMI 38.09 kg/m   General:  alert, cooperative and appears stated age   Breasts:  inspection negative, no nipple discharge or bleeding, no masses or nodularity palpable  Lungs: clear to auscultation bilaterally  Heart:  regular rate and rhythm, S1, S2 normal, no murmur, click, rub or gallop  Abdomen: soft, non-tender; bowel sounds normal; no masses,  no organomegaly  Extremities: No edema  Psych: Normal mood and affect.        Assessment:   1. 6 week postpartum exam.  2. Pap smear done on 09/2015, LSIL.  3. HTN 4. Postpartum depression  Plan:   1. Contraception: abstinence and condoms 2. Repeat pap in 1 year per guidelines. 3. Start Amlodipine, return for repeat BP check. 4. Seeing [facility] today, does not want to start medication but open to it. No suicidal ideations, willing to get  help, understands warning signs. 5. Follow up in: 1 week for BP check, 4 weeks for postpartum depression follow up (seeing [facility] today) or as needed.

## 2016-05-11 NOTE — BH Specialist Note (Signed)
Session Start time: 3:00   End Time: 3:30 Total Time:  30 minutes Type of Service: Behavioral Health - Individual/Family Interpreter: No.   Interpreter Name & Language: n/a # [facility] (2nd total since June 2017)   SUBJECTIVE: Carrie Cardenas is a 25 y.o. female  Pt. was referred by Dr Omer JackMumaw for:  depression. Pt. reports the following symptoms/concerns: Pt states that she has primarily felt overwhelmingly tired, and has been through two family losses(grandmother, cousin), and childbirth with life-threatening complications, all in the past two months; helps to talk about it all, and is thankful to be alive. Duration of problem:  Two months Severity: moderate Previous treatment: none   OBJECTIVE: Mood: Appropriate & Affect: Appropriate Risk of harm to self or others: No known risk of harm to self or others Assessments administered: none today  LIFE CONTEXT:  Family & Social: Lives with newborn and aunt; many family members nearby Life changes: Childbirth What is important to pt/family (values): Healthy baby, being alive   GOALS ADDRESSED:  -Reduce symptoms of depression  INTERVENTIONS: Strength-based, Supportive and Family Systems   ASSESSMENT:  Pt currently experiencing Adjustment disorder with depressed mood.  Pt may benefit from psychoeducation and brief therapeutic intervention regarding coping with symptoms of depression.      PLAN: 1. F/U with behavioral health clinician: Two weeks 2. Behavioral Health meds: none 3. Behavioral recommendations:  -Read educational material regarding coping with postpartum depression -Take iron pills with vitamin C(orange or other juice with Vitamin C), as prescribed -Consider Hospice counseling for self and family 4. Referral: Brief Counseling/Psychotherapy, Publishing rights managerCommunity Resource and Psychoeducation 5. From scale of 1-10, how likely are you to follow plan: 8   Gaynell FaceJamie C Dusan Lipford LCSWA Behavioral Health  Clinician  Warmhandoff:   Warm Hand Off Completed.        [facility] 03/24/2016 03/17/2016 03/17/2016  Decreased Interest 0 0 0  Down, Depressed, Hopeless 0 0 1  PHQ - 2 Score 0 0 1  Altered sleeping 0 0 2  Tired, decreased energy 0 0 2  Change in appetite 0 0 1  Feeling bad or failure about yourself  0 0 1  Trouble concentrating 0 0 0  Moving slowly or fidgety/restless 0 0 0  Suicidal thoughts 0 0 0  PHQ-9 Score 0 0 7   GAD 7 : Generalized Anxiety Score 03/17/2016  Nervous, Anxious, on Edge 0  Control/stop worrying 0  Worry too much - different things 0  Trouble relaxing 0  Restless 0  Easily annoyed or irritable 1  Afraid - awful might happen 0  Total GAD 7 Score 1

## 2016-05-11 NOTE — Patient Instructions (Signed)
Hypertension Hypertension is another name for high blood pressure. High blood pressure forces your heart to work harder to pump blood. A blood pressure reading has two numbers, which includes a higher number over a lower number (example: 110/72). Follow these instructions at home:  Have your blood pressure rechecked by your doctor.  Only take medicine as told by your doctor. Follow the directions carefully. The medicine does not work as well if you skip doses. Skipping doses also puts you at risk for problems.  Do not smoke.  Monitor your blood pressure at home as told by your doctor. Contact a doctor if:  You think you are having a reaction to the medicine you are taking.  You have repeat headaches or feel dizzy.  You have puffiness (swelling) in your ankles.  You have trouble with your vision. Get help right away if:  You get a very bad headache and are confused.  You feel weak, numb, or faint.  You get chest or belly (abdominal) pain.  You throw up (vomit).  You cannot breathe very well. This information is not intended to replace advice given to you by your health care provider. Make sure you discuss any questions you have with your health care provider. Document Released: 11/23/2007 Document Revised: 11/12/2015 Document Reviewed: 03/29/2013 Elsevier Interactive Patient Education  2017 Elsevier Inc.   Postpartum Depression and Baby Blues The postpartum period begins right after the birth of a baby. During this time, there is often a great amount of joy and excitement. It is also a time of many changes in the life of the parents. Regardless of how many times a mother gives birth, each child brings new challenges and dynamics to the family. It is not unusual to have feelings of excitement along with confusing shifts in moods, emotions, and thoughts. All mothers are at risk of developing postpartum depression or the "baby blues." These mood changes can occur right after giving  birth, or they may occur many months after giving birth. The baby blues or postpartum depression can be mild or severe. Additionally, postpartum depression can go away rather quickly, or it can be a long-term condition. What are the causes? Raised hormone levels and the rapid drop in those levels are thought to be a main cause of postpartum depression and the baby blues. A number of hormones change during and after pregnancy. Estrogen and progesterone usually decrease right after the delivery of your baby. The levels of thyroid hormone and various cortisol steroids also rapidly drop. Other factors that play a role in these mood changes include major life events and genetics. What increases the risk? If you have any of the following risks for the baby blues or postpartum depression, know what symptoms to watch out for during the postpartum period. Risk factors that may increase the likelihood of getting the baby blues or postpartum depression include:  Having a personal or family history of depression.  Having depression while being pregnant.  Having premenstrual mood issues or mood issues related to oral contraceptives.  Having a lot of life stress.  Having marital conflict.  Lacking a social support network.  Having a baby with special needs.  Having health problems, such as diabetes. What are the signs or symptoms? Symptoms of baby blues include:  Brief changes in mood, such as going from extreme happiness to sadness.  Decreased concentration.  Difficulty sleeping.  Crying spells, tearfulness.  Irritability.  Anxiety. Symptoms of postpartum depression typically begin within the first month after  giving birth. These symptoms include:  Difficulty sleeping or excessive sleepiness.  Marked weight loss.  Agitation.  Feelings of worthlessness.  Lack of interest in activity or food. Postpartum psychosis is a very serious condition and can be dangerous. Fortunately, it is  rare. Displaying any of the following symptoms is cause for immediate medical attention. Symptoms of postpartum psychosis include:  Hallucinations and delusions.  Bizarre or disorganized behavior.  Confusion or disorientation. How is this diagnosed? A diagnosis is made by an evaluation of your symptoms. There are no medical or lab tests that lead to a diagnosis, but there are various questionnaires that a health care provider may use to identify those with the baby blues, postpartum depression, or psychosis. Often, a screening tool called the [place] Postnatal Depression Scale is used to diagnose depression in the postpartum period. How is this treated? The baby blues usually goes away on its own in 1-2 weeks. Social support is often all that is needed. You will be encouraged to get adequate sleep and rest. Occasionally, you may be given medicines to help you sleep. Postpartum depression requires treatment because it can last several months or longer if it is not treated. Treatment may include individual or group therapy, medicine, or both to address any social, physiological, and psychological factors that may play a role in the depression. Regular exercise, a healthy diet, rest, and social support may also be strongly recommended. Postpartum psychosis is more serious and needs treatment right away. Hospitalization is often needed. Follow these instructions at home:  Get as much rest as you can. Nap when the baby sleeps.  Exercise regularly. Some women find yoga and walking to be beneficial.  Eat a balanced and nourishing diet.  Do little things that you enjoy. Have a cup of tea, take a bubble bath, read your favorite magazine, or listen to your favorite music.  Avoid alcohol.  Ask for help with household chores, cooking, grocery shopping, or running errands as needed. Do not try to do everything.  Talk to people close to you about how you are feeling. Get support from your partner,  family members, friends, or other new moms.  Try to stay positive in how you think. Think about the things you are grateful for.  Do not spend a lot of time alone.  Only take over-the-counter or prescription medicine as directed by your health care provider.  Keep all your postpartum appointments.  Let your health care provider know if you have any concerns. Contact a health care provider if: You are having a reaction to or problems with your medicine. Get help right away if:  You have suicidal feelings.  You think you may harm the baby or someone else. This information is not intended to replace advice given to you by your health care provider. Make sure you discuss any questions you have with your health care provider. Document Released: 03/10/2004 Document Revised: 11/12/2015 Document Reviewed: 03/18/2013 Elsevier Interactive Patient Education  2017 [employer]

## 2016-05-25 ENCOUNTER — Ambulatory Visit: Payer: Self-pay

## 2016-09-28 ENCOUNTER — Other Ambulatory Visit (HOSPITAL_COMMUNITY)
Admission: RE | Admit: 2016-09-28 | Discharge: 2016-09-28 | Disposition: A | Discharge status: Home / Self Care | Payer: Medicaid Other | Source: Ambulatory Visit | Attending: Obstetrics and Gynecology | Admitting: Obstetrics and Gynecology

## 2016-09-28 ENCOUNTER — Ambulatory Visit (INDEPENDENT_AMBULATORY_CARE_PROVIDER_SITE_OTHER): Payer: Medicaid Other | Admitting: Obstetrics and Gynecology

## 2016-09-28 ENCOUNTER — Encounter: Payer: Self-pay | Admitting: Obstetrics and Gynecology

## 2016-09-28 VITALS — BP 159/95 | HR 101 | Wt 229.8 lb

## 2016-09-28 DIAGNOSIS — Z113 Encounter for screening for infections with a predominantly sexual mode of transmission: Secondary | ICD-10-CM | POA: Diagnosis not present

## 2016-09-28 DIAGNOSIS — B9689 Other specified bacterial agents as the cause of diseases classified elsewhere: Secondary | ICD-10-CM | POA: Insufficient documentation

## 2016-09-28 DIAGNOSIS — Z87410 Personal history of cervical dysplasia: Secondary | ICD-10-CM

## 2016-09-28 DIAGNOSIS — N760 Acute vaginitis: Secondary | ICD-10-CM | POA: Diagnosis not present

## 2016-09-28 DIAGNOSIS — N898 Other specified noninflammatory disorders of vagina: Secondary | ICD-10-CM | POA: Diagnosis not present

## 2016-09-28 DIAGNOSIS — R102 Pelvic and perineal pain: Secondary | ICD-10-CM

## 2016-09-28 MED ORDER — VALACYCLOVIR HCL 1 G PO TABS: 1000.0000 mg | ORAL_TABLET | Freq: Every day | ORAL | 1 refills | Status: AC

## 2016-09-28 MED ORDER — FLUCONAZOLE 150 MG PO TABS: 150.0000 mg | ORAL_TABLET | Freq: Once | ORAL | 0 refills | Status: AC

## 2016-09-28 NOTE — Progress Notes (Signed)
Obstetrics and Gynecology Established Patient Evaluation  Appointment Date: 09/28/2016  OBGYN Clinic: [facility]  Primary Care Provider: None  Referring Provider: Self  Chief Complaint: vaginal pain  History of Present Illness: Carrie Cardenas is a 26 y.o. African-American G1P1001 (LMP: 1wk ago) , seen for the above chief complaint.   Patient states that starting with her period last week she had new onset vaginal/introitus pain. She's never had it before and it feels very irritated and uncomfortable. She had some vaginal discharge with the period but non currently. She declines a h/o HSV and states she's had new partner but uses condoms. She hasn't tried anything OTC.  No VB, vaginal discharge, vaginal itching, fevers, chills, abdominal pain, chest pain, SOB  Review of Systems:  as noted in the History of Present Illness.  Past Medical History:  Past Medical History:  Diagnosis Date  . Depression    pt hospitalized at 25 years old for major depression    Past Surgical History:  Past Surgical History:  Procedure Laterality Date  . DILATION AND EVACUATION N/A 04/05/2016   Procedure: DILATATION AND EVACUATION;  Surgeon: Tereso Newcomer, MD;  Location: [facility] ORS;  Service: Gynecology;  Laterality: N/A;  . ESOPHAGOGASTRODUODENOSCOPY N/A 04/07/2016   Procedure: ESOPHAGOGASTRODUODENOSCOPY (EGD);  Surgeon: Charlott Rakes, MD;  Location: [facility] ENDOSCOPY;  Service: Endoscopy;  Laterality: N/A;  . ESOPHAGOGASTRODUODENOSCOPY N/A 04/07/2016   Procedure: ESOPHAGOGASTRODUODENOSCOPY (EGD);  Surgeon: Charlott Rakes, MD;  Location: [facility] OR;  Service: Endoscopy;  Laterality: N/A;  . ESOPHAGOGASTRODUODENOSCOPY N/A 04/08/2016   Procedure: ESOPHAGOGASTRODUODENOSCOPY (EGD);  Surgeon: Ovidio Kin, MD;  Location: [facility] ENDOSCOPY;  Service: General;  Laterality: N/A;  . GASTRIC BYPASS    . SLEEVE GASTROPLASTY  2015    Past Obstetrical History:  OB History  Gravida Para Term  Preterm AB Living  0 0 1  SAB TAB Ectopic Multiple Live Births  0 0 0 0 1    # Outcome Date GA Lbr Len/2nd Weight Sex Delivery Anes PTL Lv  1 Term 04/01/16 [redacted]w[redacted]d 05:35 / 00:48 6 lb 11.1 oz (3.035 kg) M Vag-Spont EPI  LIV     Past Gynecological History: As per HPI. Periods: LSIL 09/2015  Social History:  Social History   Social History  . Marital status: Single    Spouse name: N/A  . Number of children: N/A  . Years of education: N/A   Occupational History  . Not on file.   Social History Main Topics  . Smoking status: Never Smoker  . Smokeless tobacco: Never Used  . Alcohol use No  . Drug use: No  . Sexual activity: No   Other Topics Concern  . Not on file   Social History Narrative  . No narrative on file    Family History:  Family History  Problem Relation Age of Onset  . Fibroids Mother   . Anemia Mother   . Pulmonary embolism Mother   . Cancer Maternal Grandmother      Medications None  Allergies Patient has no known allergies.   Physical Exam:  BP (!) 159/95   Pulse (!) 101   Wt 229 lb 12.8 oz (104.2 kg)   BMI 38.24 kg/m  Body mass index is 38.24 kg/m. General appearance: Well nourished, well developed female in no acute distress.  Cardiovascular: normal s1 and s2.  No murmurs, rubs or gallops. Respiratory:  Clear to auscultation bilateral. Normal respiratory effort Abdomen: positive bowel sounds and no masses, hernias; diffusely  non tender to palpation, non distended Neuro/Psych:  Normal mood and affect.  Skin:  Warm and dry.  Lymphatic:  No inguinal lymphadenopathy.   Pelvic exam: limited by body habitus Very difficult exam due to patient discomfort. Unable to get a good view of external genitalia or introitus. STD swab obtained  Laboratory: none  Radiology: none  Assessment: pt stable  Plan:  1. Vaginal pain D/w her that s/s very sx for HSV. I told her that I recommend presumptive tx with valtrex and diflucan and if it gets  better than most likely HSV primary outbreak and can do pap at f/u exam. If it doesn't get better, can try hurricane spray and see if that helps to get pap smear. Pt told that she can try hurricane spray for symptomatic relief. Pt asked if she needs to be on pelvic rest and I told her yes.   2. h/o LSIL pap Tried to get a pap smear today but see above exam.  D/w pt importance of repeat to follow closely.  Will try again at f/u visit in 3-4wks  3. PCP Information for community health and wellness given to her.   RTC 3-4wks for pap smear attempt, f/u s/s and exam  Cornelia Copa MD Attending [facility])

## 2016-09-29 LAB — CERVICOVAGINAL ANCILLARY ONLY
BACTERIAL VAGINITIS: POSITIVE — AB
CHLAMYDIA, DNA PROBE: NEGATIVE
Candida vaginitis: NEGATIVE
NEISSERIA GONORRHEA: NEGATIVE
Trichomonas: NEGATIVE

## 2016-10-03 ENCOUNTER — Other Ambulatory Visit: Payer: Self-pay

## 2016-10-03 MED ORDER — METRONIDAZOLE 500 MG PO TABS: 500.0000 mg | ORAL_TABLET | Freq: Two times a day (BID) | ORAL | 0 refills | Status: DC

## 2016-10-03 NOTE — Telephone Encounter (Signed)
Patient tested positive for BV RX has been sent to [facility]. I have left a message for patient to call us back regarding results of BV.

## 2016-10-24 ENCOUNTER — Encounter: Payer: Self-pay | Admitting: Obstetrics and Gynecology

## 2016-10-24 ENCOUNTER — Ambulatory Visit: Payer: Self-pay | Admitting: Obstetrics and Gynecology

## 2016-10-24 NOTE — Progress Notes (Signed)
Patient did not keep GYN appointment for 10/24/2016.  Kessler Kopinski, Jr MD Attending [facility]   

## 2017-08-02 ENCOUNTER — Encounter (INDEPENDENT_AMBULATORY_CARE_PROVIDER_SITE_OTHER): Payer: Self-pay | Admitting: Physician Assistant

## 2017-08-02 ENCOUNTER — Ambulatory Visit (INDEPENDENT_AMBULATORY_CARE_PROVIDER_SITE_OTHER): Payer: Medicaid Other | Admitting: Physician Assistant

## 2017-08-02 VITALS — BP 109/71 | HR 88 | Temp 97.7°F | Resp 18 | Ht 65.0 in | Wt 246.0 lb

## 2017-08-02 DIAGNOSIS — Z6841 Body Mass Index (BMI) 40.0 and over, adult: Secondary | ICD-10-CM | POA: Diagnosis not present

## 2017-08-02 DIAGNOSIS — Z0000 Encounter for general adult medical examination without abnormal findings: Secondary | ICD-10-CM | POA: Diagnosis not present

## 2017-08-02 NOTE — Progress Notes (Signed)
Subjective:  Patient ID: Carrie Cardenas, female    DOB: 08/17/1990  Age: 27 y.o. MRN: [id number]  CC: physical exam  HPI Carrie Cardenas is a 27 y.o. female with a medical history of depression, Mallory Weiss Tear, cervical dysplasia, Sepsis, hypovolemic shock, GI Bleed, Gastric sleeve surgery, and severe preeclampsia presents as a new patient wanting a general exam. Generally feels well but is upset she has gained weight despite gastric sleeve surgery. The weight gain is attributed to her pregnancy. Pt does not diet or exercise. Does not endorse CP, palpitations, SOB, HA, abdominal pain, f/c/n/v, rash, swelling, or GI/GU sxs.       Outpatient Medications Prior to Visit  Medication Sig Dispense Refill  . acetaminophen (TYLENOL) 500 MG tablet Take 1,000 mg by mouth every 6 (six) hours as needed for mild pain or headache.    . ibuprofen (ADVIL,MOTRIN) 600 MG tablet Take 1 tablet (600 mg total) by mouth every 6 (six) hours. 30 tablet 3  . pantoprazole (PROTONIX) 40 MG tablet Take 1 tablet (40 mg total) by mouth daily at 12 noon. 30 tablet 0  . amLODipine (NORVASC) 5 MG tablet Take 1 tablet (5 mg total) by mouth daily. (Patient not taking: Reported on 09/28/2016) 30 tablet 1  . docusate sodium (COLACE) 100 MG capsule Take 1 capsule (100 mg total) by mouth 2 (two) times daily as needed. (Patient not taking: Reported on 05/11/2016) 30 capsule 2  . ferrous sulfate 325 (65 FE) MG tablet Take 1 tablet (325 mg total) by mouth daily. (Patient not taking: Reported on 05/11/2016) 30 tablet 3  . metroNIDAZOLE (FLAGYL) 500 MG tablet Take 1 tablet (500 mg total) by mouth 2 (two) times daily. 14 tablet 0  . ondansetron (ZOFRAN) 4 MG tablet Take 1 tablet (4 mg total) by mouth every 6 (six) hours as needed for nausea. (Patient not taking: Reported on 05/11/2016) 20 tablet 0  . sucralfate (CARAFATE) 1 GM/10ML suspension Take 10 mLs (1 g total) by mouth 4 (four) times daily -  with meals and at bedtime. (Patient  not taking: Reported on 09/28/2016) 420 mL 0   No facility-administered medications prior to visit.      ROS Review of Systems  Constitutional: Negative for chills, fever and malaise/fatigue.  Eyes: Negative for blurred vision.  Respiratory: Negative for shortness of breath.   Cardiovascular: Negative for chest pain and palpitations.  Gastrointestinal: Negative for abdominal pain and nausea.  Genitourinary: Negative for dysuria and hematuria.  Musculoskeletal: Negative for joint pain and myalgias.  Skin: Negative for rash.  Neurological: Negative for tingling and headaches.  Psychiatric/Behavioral: Negative for depression. The patient is not nervous/anxious.     Objective:  BP 109/71 (BP Location: Left Arm, Patient Position: Sitting, Cuff Size: Large)   Pulse 88   Temp 97.7 F (36.5 C) (Oral)   Resp 18   Ht 5\' 5"  (1.651 m)   Wt 246 lb (111.6 kg)   LMP 07/23/2017   SpO2 98%   BMI 40.94 kg/m   BP/Weight 08/02/2017 09/28/2016 05/11/2016  Systolic BP 109 159 158  Diastolic BP 71 95 105  Wt. (Lbs) 246 229.8 228.9  BMI 40.94 38.24 38.09      Physical Exam  Constitutional: She is oriented to person, place, and time.  Well developed, obese, NAD, polite  HENT:  Head: Normocephalic and atraumatic.  Eyes: Conjunctivae and EOM are normal. Pupils are equal, round, and reactive to light. No scleral icterus.  Neck: Normal range of motion.  Neck supple. No thyromegaly present.  Cardiovascular: Normal rate, regular rhythm and normal heart sounds. Exam reveals no gallop and no friction rub.  No murmur heard. Pulmonary/Chest: Effort normal and breath sounds normal. No respiratory distress. She has no wheezes. She has no rales.  Abdominal: Soft. Bowel sounds are normal. She exhibits no distension and no mass. There is no tenderness. There is no rebound and no guarding.  Musculoskeletal: Normal range of motion. She exhibits no edema or deformity.  Lymphadenopathy:    She has no cervical  adenopathy.  Neurological: She is alert and oriented to person, place, and time. She has normal reflexes. No cranial nerve deficit. Coordination normal.  Skin: Skin is warm and dry. No rash noted. No erythema. No pallor.  Psychiatric: She has a normal mood and affect. Her behavior is normal. Thought content normal.  Vitals reviewed.    Assessment & Plan:   1. Annual physical exam - Comprehensive metabolic panel - Lipid panel - CBC with Differential  2. Class 3 severe obesity without serious comorbidity with body mass index (BMI) of 40.0 to 44.9 in adult, unspecified obesity type (HCC) - TSH - Spoke to patient about starting a diet and exercise program. Will consider phentermine should pt establish a healthy lifestyle in regards to diet and exercise.     Follow-up: Return in about 2 months (around 09/30/2017) for PAP.   Loletta Specter PA

## 2017-08-02 NOTE — Patient Instructions (Signed)
Obesity, Adult  Obesity is the condition of having too much total body fat. Being overweight or obese means that your weight is greater than what is considered healthy for your body size. Obesity is determined by a measurement called BMI. BMI is an estimate of body fat and is calculated from height and weight. For adults, a BMI of 30 or higher is considered obese.  Obesity can eventually lead to other health concerns and major illnesses, including:  · Stroke.  · Coronary artery disease (CAD).  · Type 2 diabetes.  · Some types of cancer, including cancers of the colon, breast, uterus, and gallbladder.  · Osteoarthritis.  · High blood pressure (hypertension).  · High cholesterol.  · Sleep apnea.  · Gallbladder stones.  · Infertility problems.    What are the causes?  The main cause of obesity is taking in (consuming) more calories than your body uses for energy. Other factors that contribute to this condition may include:  · Being born with genes that make you more likely to become obese.  · Having a medical condition that causes obesity. These conditions include:  ? Hypothyroidism.  ? Polycystic ovarian syndrome (PCOS).  ? Binge-eating disorder.  ? Cushing syndrome.  · Taking certain medicines, such as steroids, antidepressants, and seizure medicines.  · Not being physically active (sedentary lifestyle).  · Living where there are limited places to exercise safely or buy healthy foods.  · Not getting enough sleep.    What increases the risk?  The following factors may increase your risk of this condition:  · Having a family history of obesity.  · Being a woman of African-American descent.  · Being a man of Hispanic descent.    What are the signs or symptoms?  Having excessive body fat is the main symptom of this condition.  How is this diagnosed?  This condition may be diagnosed based on:  · Your symptoms.  · Your medical history.  · A physical exam. Your health care provider may measure:  ? Your BMI. If you are an  adult with a BMI between 25 and less than 30, you are considered overweight. If you are an adult with a BMI of 30 or higher, you are considered obese.  ? The distances around your hips and your waist (circumferences). These may be compared to each other to help diagnose your condition.  ? Your skinfold thickness. Your health care provider may gently pinch a fold of your skin and measure it.    How is this treated?  Treatment for this condition often includes changing your lifestyle. Treatment may include some or all of the following:  · Dietary changes. Work with your health care provider and a dietitian to set a weight-loss goal that is healthy and reasonable for you. Dietary changes may include eating:  ? Smaller portions. A portion size is the amount of a particular food that is healthy for you to eat at one time. This varies from person to person.  ? Low-calorie or low-fat options.  ? More whole grains, fruits, and vegetables.  · Regular physical activity. This may include aerobic activity (cardio) and strength training.  · Medicine to help you lose weight. Your health care provider may prescribe medicine if you are unable to lose 1 pound a week after 6 weeks of eating more healthily and doing more physical activity.  · Surgery. Surgical options may include gastric banding and gastric bypass. Surgery may be done if:  ? Other   treatments have not helped to improve your condition.  ? You have a BMI of 40 or higher.  ? You have life-threatening health problems related to obesity.    Follow these instructions at home:    Eating and drinking    · Follow recommendations from your health care provider about what you eat and drink. Your health care provider may advise you to:  ? Limit fast foods, sweets, and processed snack foods.  ? Choose low-fat options, such as low-fat milk instead of whole milk.  ? Eat 5 or more servings of fruits or vegetables every day.  ? Eat at home more often. This gives you more control over  what you eat.  ? Choose healthy foods when you eat out.  ? Learn what a healthy portion size is.  ? Keep low-fat snacks on hand.  ? Avoid sugary drinks, such as soda, fruit juice, iced tea sweetened with sugar, and flavored milk.  ? Eat a healthy breakfast.  · Drink enough water to keep your urine clear or pale yellow.  · Do not go without eating for long periods of time (do not fast) or follow a fad diet. Fasting and fad diets can be unhealthy and even dangerous.  Physical Activity  · Exercise regularly, as told by your health care provider. Ask your health care provider what types of exercise are safe for you and how often you should exercise.  · Warm up and stretch before being active.  · Cool down and stretch after being active.  · Rest between periods of activity.  Lifestyle  · Limit the time that you spend in front of your TV, computer, or video game system.  · Find ways to reward yourself that do not involve food.  · Limit alcohol intake to no more than 1 drink a day for nonpregnant women and 2 drinks a day for men. One drink equals 12 oz of beer, 5 oz of wine, or 1½ oz of hard liquor.  General instructions  · Keep a weight loss journal to keep track of the food you eat and how much you exercise you get.  · Take over-the-counter and prescription medicines only as told by your health care provider.  · Take vitamins and supplements only as told by your health care provider.  · Consider joining a support group. Your health care provider may be able to recommend a support group.  · Keep all follow-up visits as told by your health care provider. This is important.  Contact a health care provider if:  · You are unable to meet your weight loss goal after 6 weeks of dietary and lifestyle changes.  This information is not intended to replace advice given to you by your health care provider. Make sure you discuss any questions you have with your health care provider.  Document Released: 07/14/2004 Document Revised:  11/09/2015 Document Reviewed: 03/25/2015  Elsevier Interactive Patient Education © 2018 [employer]vier [employer]

## 2018-04-17 ENCOUNTER — Other Ambulatory Visit: Payer: Self-pay

## 2018-04-17 ENCOUNTER — Encounter (HOSPITAL_COMMUNITY): Payer: Self-pay | Admitting: Emergency Medicine

## 2018-04-17 ENCOUNTER — Emergency Department (HOSPITAL_COMMUNITY)
Admission: EM | Admit: 2018-04-17 | Discharge: 2018-04-18 | Disposition: A | Discharge status: Home / Self Care | Payer: Medicaid Other | Attending: Emergency Medicine | Admitting: Emergency Medicine

## 2018-04-17 DIAGNOSIS — R112 Nausea with vomiting, unspecified: Secondary | ICD-10-CM | POA: Insufficient documentation

## 2018-04-17 DIAGNOSIS — R42 Dizziness and giddiness: Secondary | ICD-10-CM | POA: Diagnosis present

## 2018-04-17 DIAGNOSIS — M545 Low back pain: Secondary | ICD-10-CM | POA: Diagnosis not present

## 2018-04-17 DIAGNOSIS — R1012 Left upper quadrant pain: Secondary | ICD-10-CM | POA: Insufficient documentation

## 2018-04-17 DIAGNOSIS — Z5321 Procedure and treatment not carried out due to patient leaving prior to being seen by health care provider: Secondary | ICD-10-CM | POA: Diagnosis not present

## 2018-04-17 LAB — COMPREHENSIVE METABOLIC PANEL
ALBUMIN: 4.1 g/dL (ref 3.5–5.0)
ALK PHOS: 39 U/L (ref 38–126)
ALT: 13 U/L (ref 0–44)
AST: 16 U/L (ref 15–41)
Anion gap: 9 (ref 5–15)
BUN: 15 mg/dL (ref 6–20)
CALCIUM: 9.5 mg/dL (ref 8.9–10.3)
CHLORIDE: 105 mmol/L (ref 98–111)
CO2: 22 mmol/L (ref 22–32)
CREATININE: 0.63 mg/dL (ref 0.44–1.00)
GFR calc Af Amer: >60 mL/min (ref >60)
GFR calc non Af Amer: >60 mL/min (ref >60)
GLUCOSE: 89 mg/dL (ref 70–99)
Potassium: 4.4 mmol/L (ref 3.5–5.1)
SODIUM: 136 mmol/L (ref 135–145)
Total Bilirubin: 0.3 mg/dL (ref 0.3–1.2)
Total Protein: 7.4 g/dL (ref 6.5–8.1)

## 2018-04-17 LAB — CBC
HEMATOCRIT: 37.7 % (ref 36.0–46.0)
Hemoglobin: 11.1 g/dL — ABNORMAL LOW (ref 12.0–15.0)
MCH: 25.6 pg — ABNORMAL LOW (ref 26.0–34.0)
MCHC: 29.4 g/dL — ABNORMAL LOW (ref 30.0–36.0)
MCV: 86.9 fL (ref 80.0–100.0)
PLATELETS: 229 10*3/uL (ref 150–400)
RBC: 4.34 MIL/uL (ref 3.87–5.11)
RDW: 14.4 % (ref 11.5–15.5)
WBC: 5.2 10*3/uL (ref 4.0–10.5)
nRBC: 0 % (ref 0.0–0.2)

## 2018-04-17 LAB — URINALYSIS, ROUTINE W REFLEX MICROSCOPIC
BILIRUBIN URINE: NEGATIVE
Glucose, UA: NEGATIVE mg/dL
Hgb urine dipstick: NEGATIVE
KETONES UR: NEGATIVE mg/dL
Nitrite: NEGATIVE
PH: 7 (ref 5.0–8.0)
Protein, ur: NEGATIVE mg/dL
SPECIFIC GRAVITY, URINE: 1.017 (ref 1.005–1.030)

## 2018-04-17 LAB — I-STAT BETA HCG BLOOD, ED (MC, WL, AP ONLY)

## 2018-04-17 LAB — LIPASE, BLOOD: LIPASE: 29 U/L (ref 11–51)

## 2018-04-17 NOTE — ED Notes (Signed)
Called to recheck vitals. No answer 

## 2018-04-17 NOTE — ED Triage Notes (Signed)
Pt reports lightheadedness that started 2 weeks ago. Pt reports she felt like she was going to pass out at work because her job requires her to be on her feet for 8 hours. No LOC. Pt also reports LUQ abd pain cramping with N/V that radiated to her lower back "like contraction pains". Denies diarrhea. Reports pain 5/10.

## 2018-04-17 NOTE — ED Notes (Signed)
Called for patient x 3 in lobby with no response 

## 2018-04-17 NOTE — ED Notes (Signed)
Unable to locate in waiting room

## 2018-04-18 IMAGING — US US MFM OB FOLLOW-UP
1 series · 13 of 28 positions shown · non-contrast
Comparison: none

[Series 1: us mfm ob follow-up · 13 of 103 slices shown]
[im 4/103]
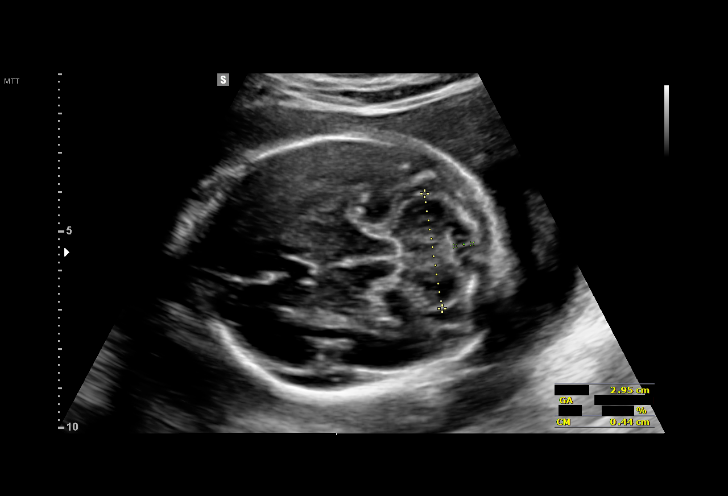
[im 12/103]
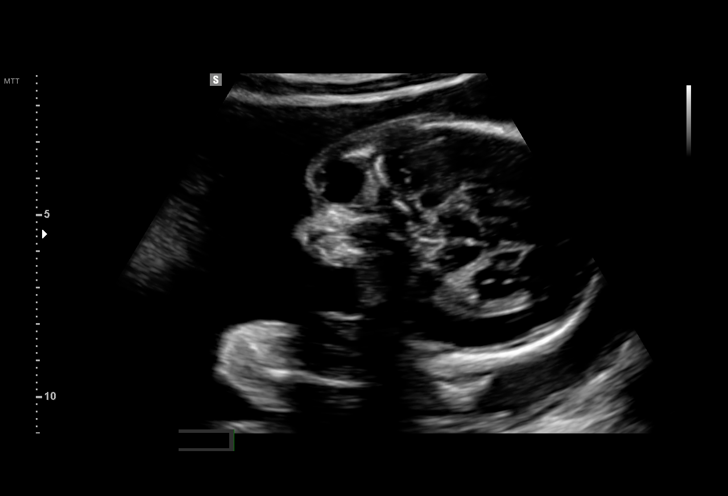
[im 19/103]
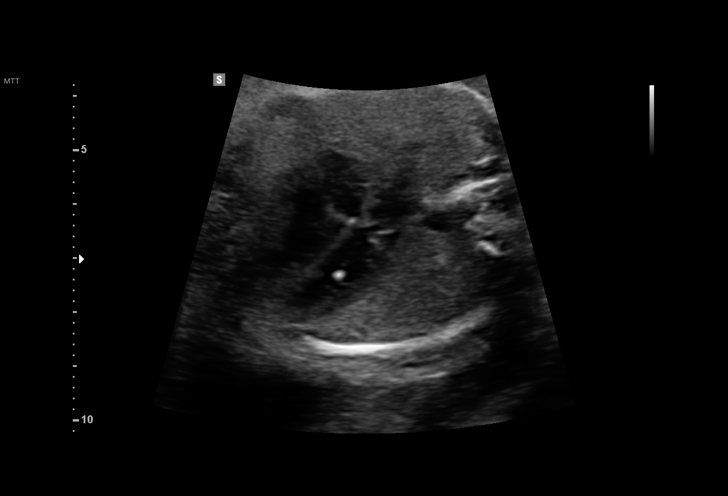
[im 27/103]
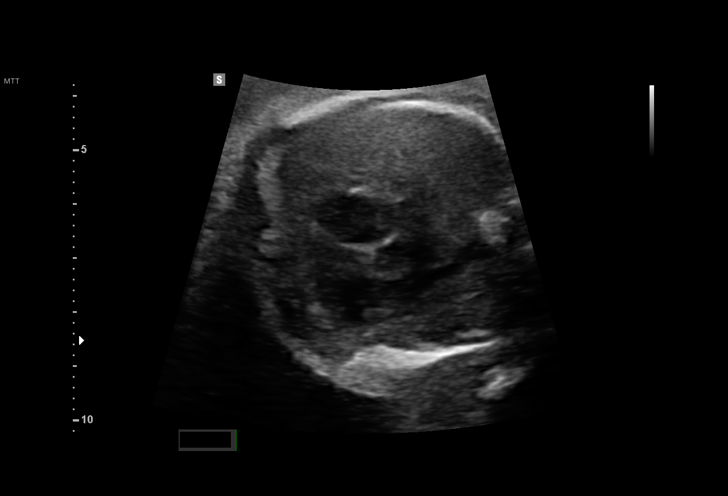
[im 35/103]
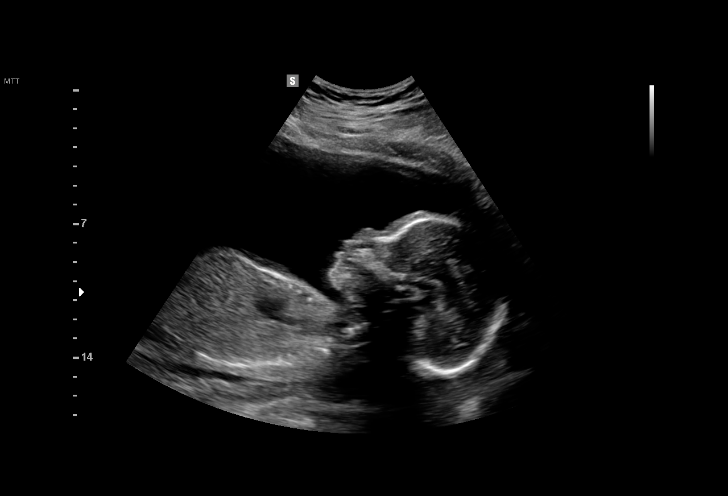
[im 42/103]
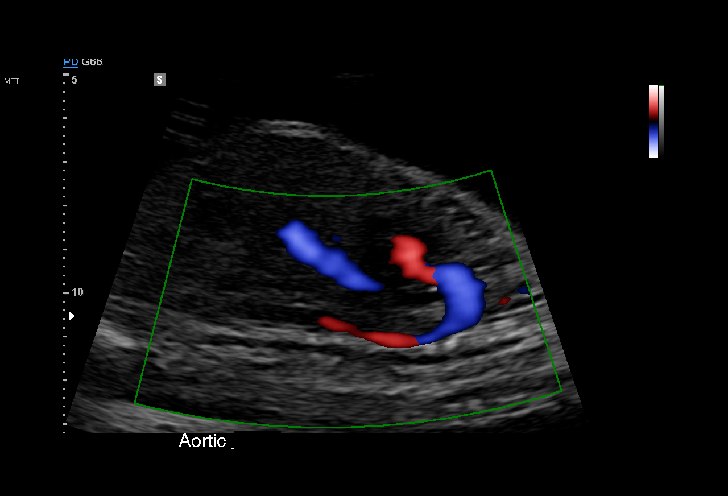
[im 53/103]
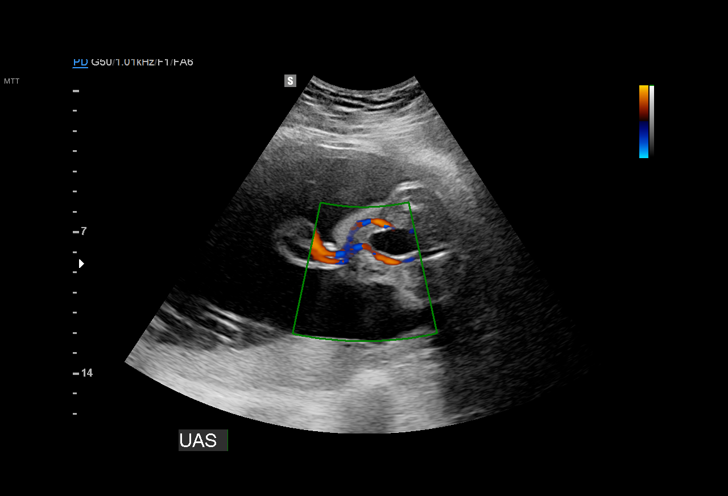
[im 61/103]
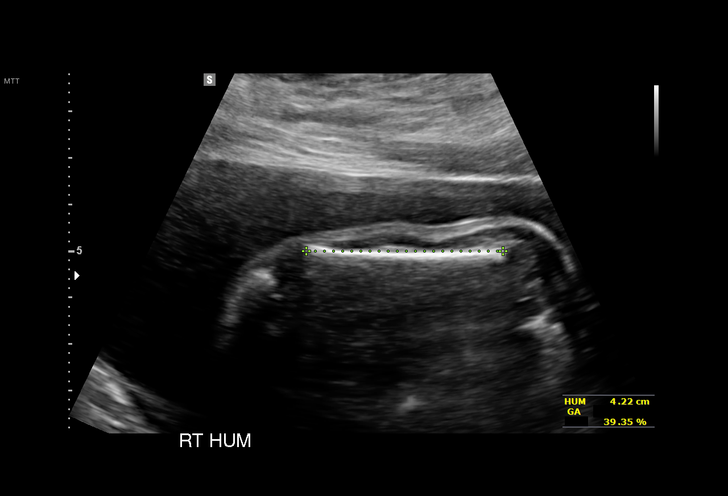
[im 69/103]
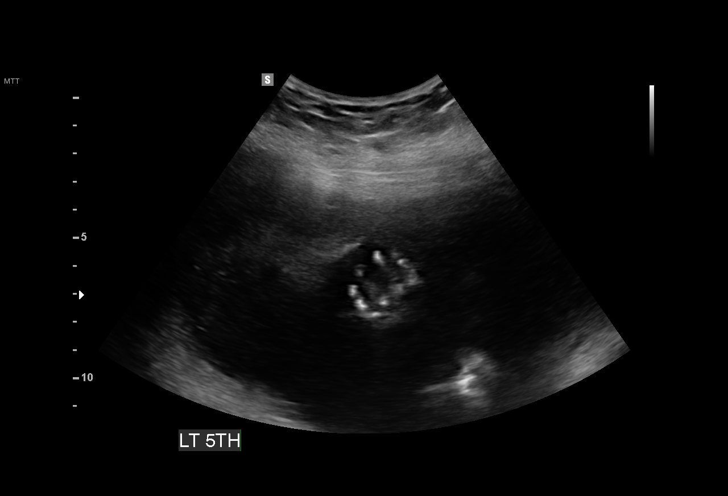
[im 76/103]
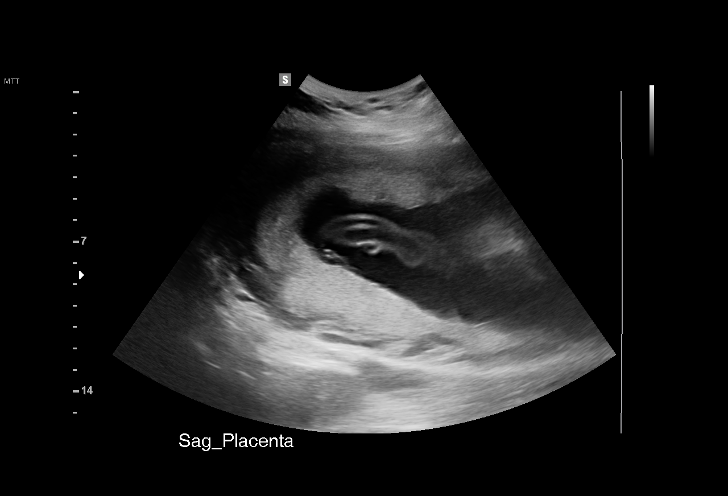
[im 84/103]
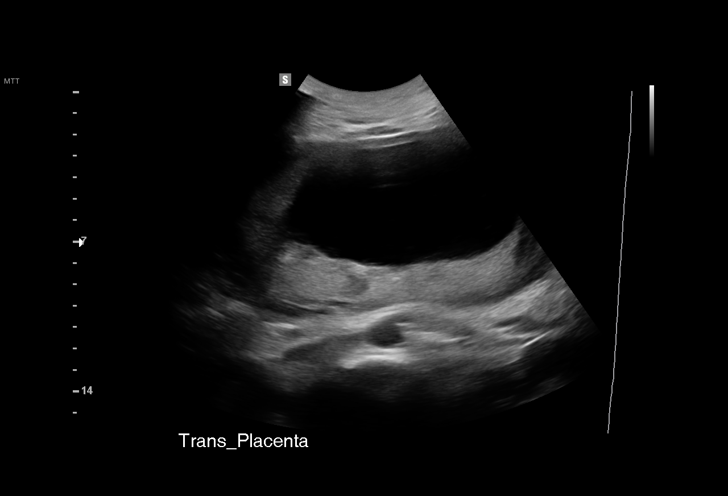
[im 91/103]
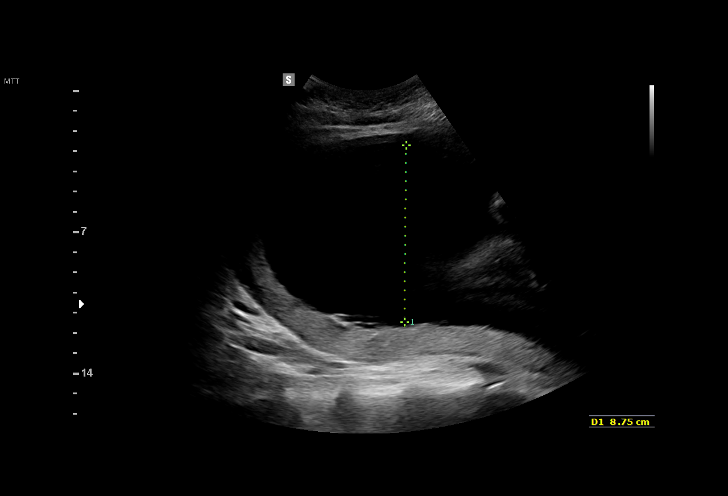
[im 99/103]
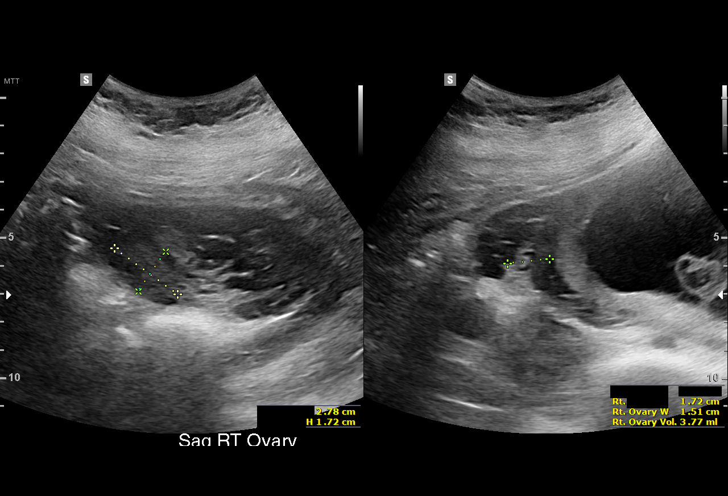

[13 of 28 positions shown; findings below may reference images not displayed]

Faculty Practice
LM CNM

1  HENDARTO CAHYO IIE            [id number]      [id number]     [id number]
Indications

25 weeks gestation of pregnancy
Obesity complicating pregnancy, second
trimester (BMI 41)
Bariatric surgery (K22.S6I)
Evaluate anatomy not seen on prior             Z36
sonogram
Obesity complicating pregnancy, second
trimester
OB History

Blood Type:            Height:  5'6"   Weight (lb):  257       BMI:
Gravidity:    1         Term:   0        Prem:   0         SAB:   0
TOP:          0       Ectopic:  0        Living: 0
Fetal Evaluation

Num Of Fetuses:     1
Fetal Heart         140
Rate(bpm):
Cardiac Activity:   Observed
Presentation:       Cephalic
Placenta:           Posterior, above cervical os
P. Cord Insertion:  Visualized, central

Amniotic Fluid
AFI FV:      Mild polyhydramnios

AFI Sum(cm)     %Tile       Largest Pocket(cm)
24.82           > 97
RUQ(cm)       RLQ(cm)       LUQ(cm)        LLQ(cm)
7.94

Comment:    Midline pocket = 9.5 cms
Biometry

BPD:      64.1  mm     G. Age:  26w 0d         64  %    CI:         74.62  %    70 - 86
FL/HC:       19.7  %    18.7 -
HC:      235.5  mm     G. Age:  25w 4d         39  %    HC/AC:       1.06       1.04 -
AC:      223.2  mm     G. Age:  26w 5d         82  %    FL/BPD:      72.5  %    71 - 87
FL:       46.5  mm     G. Age:  25w 4d         42  %    FL/AC:       20.8  %    20 - 24
HUM:      42.1  mm     G. Age:  25w 2d         45  %
CER:      29.5  mm     G. Age:  26w 2d         67  %

CM:        4.4  mm

Est. FW:     896   gm          2 lb     70  %
Gestational Age

LMP:           25w 2d        Date:  07/07/15                 EDD:    04/12/16
U/S Today:     26w 0d                                        EDD:    04/07/16
Best:          25w 2d     Det. By:  LMP  (07/07/15)          EDD:    04/12/16
Anatomy

Cranium:               Appears normal         Aortic Arch:            Appears normal
Cavum:                 Appears normal         Ductal Arch:            Appears normal
Ventricles:            Appears normal         Diaphragm:              Appears normal
Choroid Plexus:        Appears normal         Stomach:                Appears normal, left
sided
Cerebellum:            Appears normal         Abdomen:                Appears normal
Posterior Fossa:       Appears normal         Abdominal Wall:         Appears nml (cord
insert, abd wall)
Nuchal Fold:           Appears normal         Cord Vessels:           Appears normal (3
vessel cord)
Face:                  Appears normal         Kidneys:                Appear normal
(orbits and profile)
Lips:                  Appears normal         Bladder:                Appears normal
Thoracic:              Appears normal         Spine:                  Previously seen
Heart:                 Appears normal         Upper Extremities:      Previously seen
(EIF,LV)
RVOT:                  Appears normal         Lower Extremities:      Previously seen
LVOT:                  Appears normal

Other:  Fetus appears to be a male. Technically difficult due to maternal
habitus.
Cervix Uterus Adnexa

Cervix
Length:            4.7  cm.
Normal appearance by transabdominal scan.

Uterus
No abnormality visualized.

Left Ovary
Size(cm)     3.77   x   2.28   x  1.69      Vol(ml):
Within normal limits. No adnexal mass visualized.
Right Ovary
Size(cm)     2.78   x   1.51   x  1.72      Vol(ml):
Within normal limits. No adnexal mass visualized.

Cul De Sac:   No free fluid seen.

Adnexa:       No abnormality visualized.
Impression

SIUP at 25+2 weeks
Normal interval anatomy; anatomic survey complete; normal
stomach and nuchal region
Mild polyhydramnios
Appropriate interval growth with EFW at the 70th %tile
Recommendations

Follow-up ultrasound in 3 weeks to reassess amniotic fluid
volume

## 2018-04-18 NOTE — ED Notes (Signed)
Pt states she does not want to stay for further evaluation d/t wait time getting back to room.  Will have pt sign AMA form.

## 2018-05-23 IMAGING — US US MFM FETAL BPP W/O NON-STRESS
1 series · 15 of 23 positions shown · non-contrast
Comparison: none

[Series 1: us mfm fetal bpp w/o non-stress · 23 acquisitions, 15 frames shown]
[im 1/23]
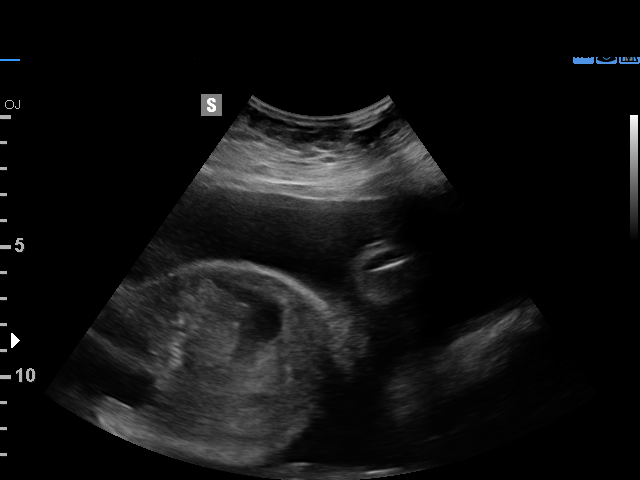
[im 3/23]
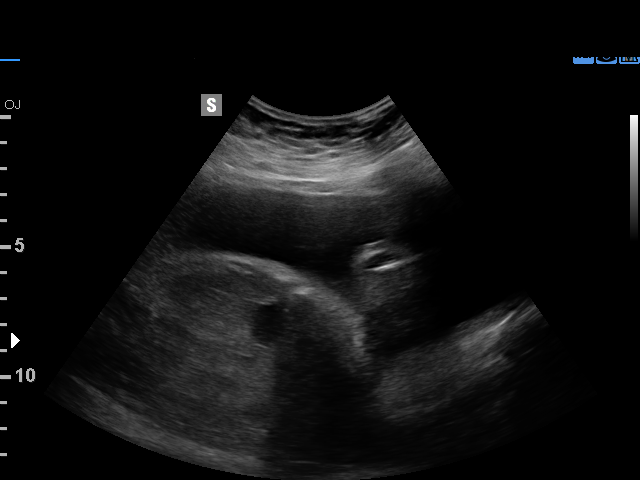
[im 4/23]
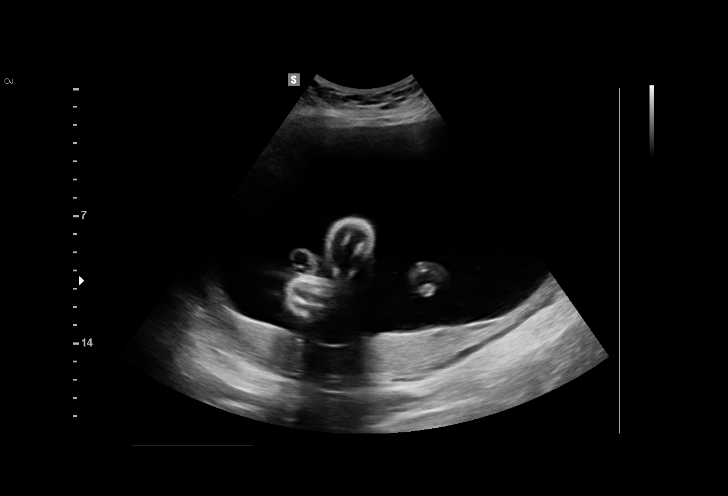
[im 6/23]
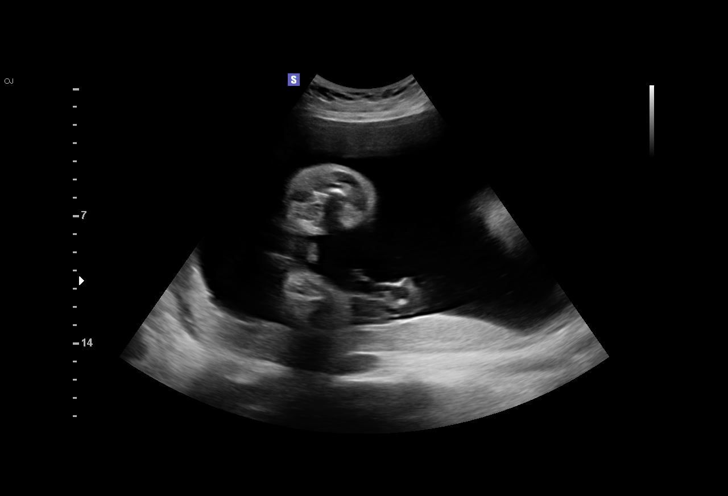
[im 7/23]
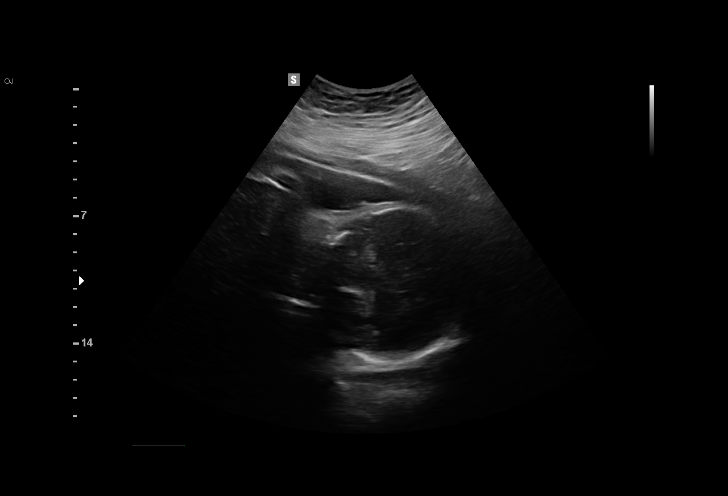
[im 9/23]
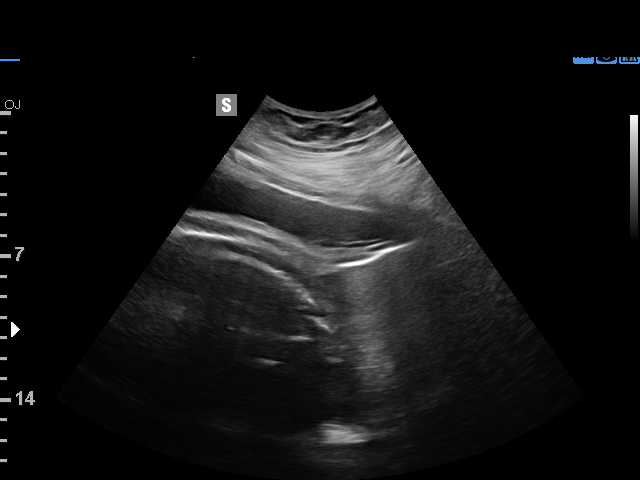
[im 10/23]
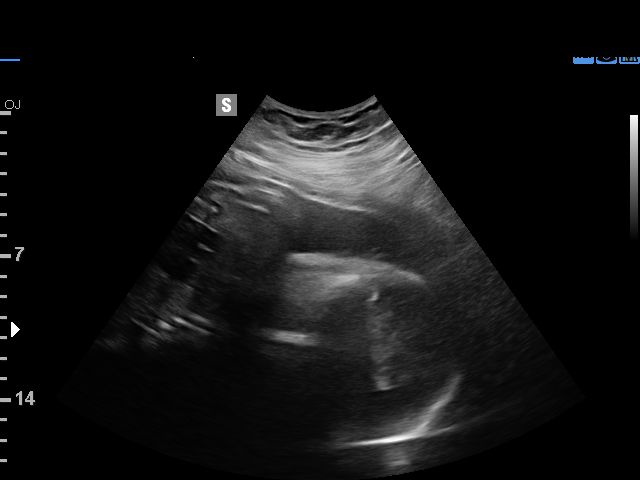
[im 12/23]
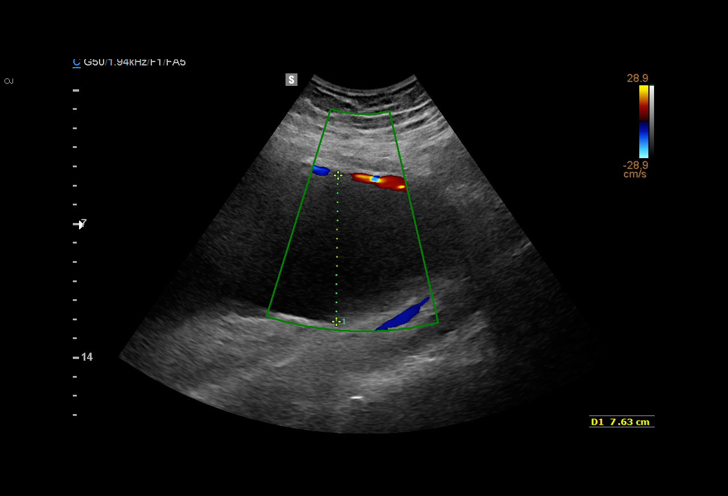
[im 14/23]
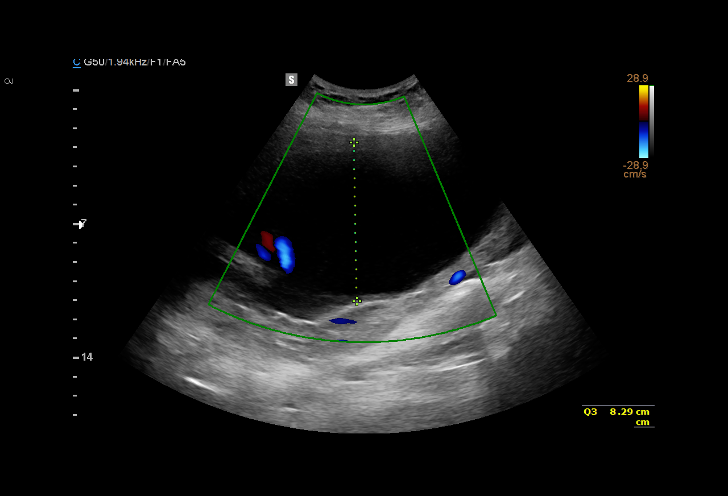
[im 15/23]
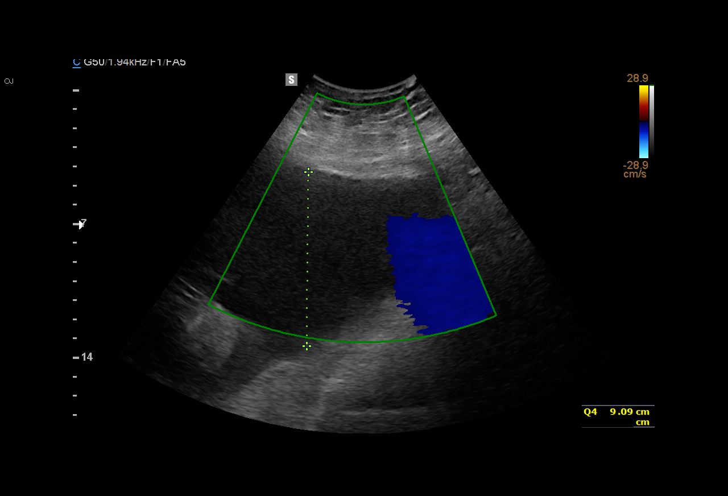
[im 17/23]
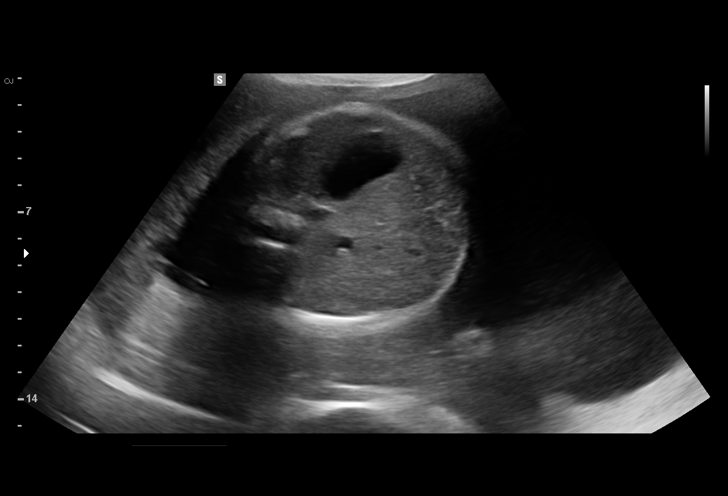
[im 18/23]
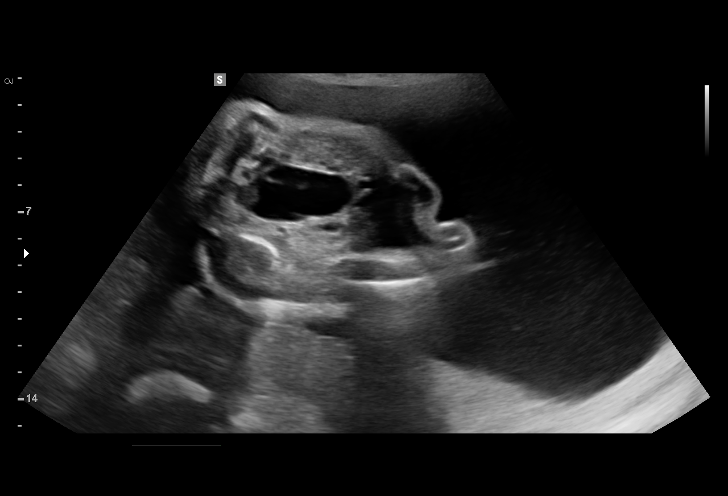
[im 20/23]
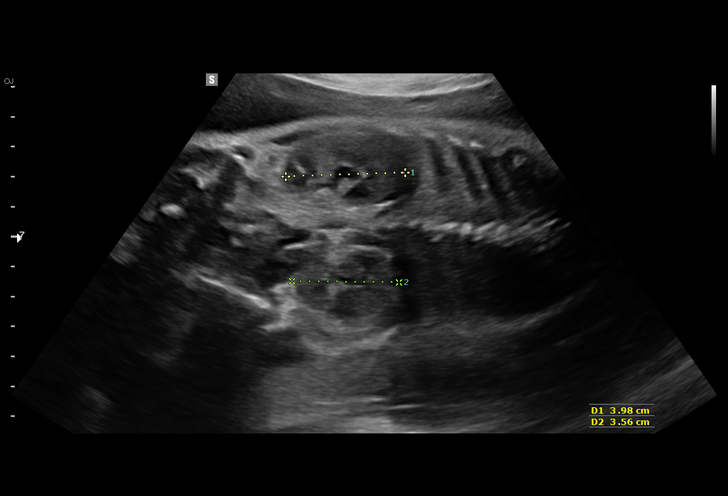
[im 21/23]
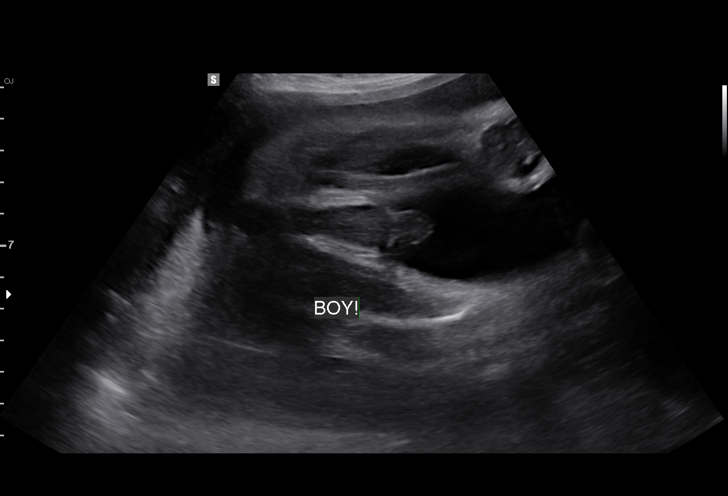
[im 23/23]
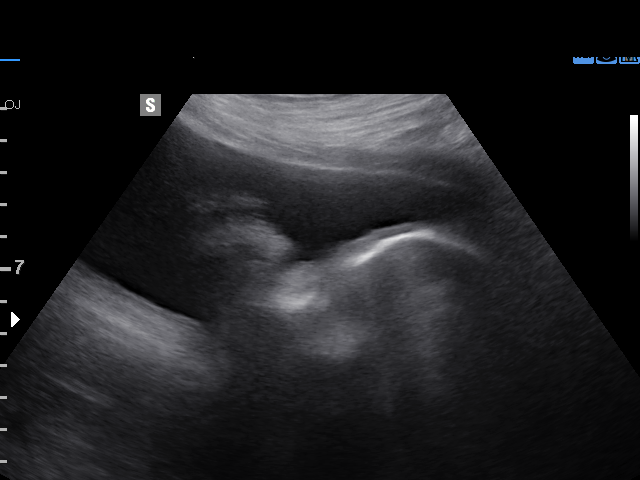

[15 of 23 positions shown; findings below may reference images not displayed]

Faculty Practice
[facility] CNM

1  VAL PEEK            [id number]      [id number]     [id number]
Indications

30 weeks gestation of pregnancy
Obesity complicating pregnancy, third
trimester
Polyhydramnios, third trimester, antepartum
condition or complication, unspecified fetus
OB History

Blood Type:            Height:  5'6"   Weight (lb):  257      BMI:
Gravidity:    1         Term:   0        Prem:   0        SAB:   0
TOP:          0       Ectopic:  0        Living: 0
Fetal Evaluation

Num Of Fetuses:     1
Fetal Heart         144
Rate(bpm):
Cardiac Activity:   Observed
Presentation:       Cephalic
Placenta:           Posterior, above cervical os

Amniotic Fluid
AFI FV:      Polyhydramnios

AFI Sum(cm)     %Tile       Largest Pocket(cm)
30.81           > 97

RUQ(cm)       RLQ(cm)       LUQ(cm)        LLQ(cm)
5.82
Biophysical Evaluation

Amniotic F.V:   Within normal limits       F. Tone:        Observed
F. Movement:    Observed                   Score:          [DATE]
F. Breathing:   Observed
Gestational Age

LMP:           30w 2d       Date:   07/07/15                 EDD:   04/12/16
Best:          30w 2d    Det. By:   LMP  (07/07/15)          EDD:   04/12/16
Cervix Uterus Adnexa

Cervix
Not visualized (advanced GA >42wks)

Uterus
No abnormality visualized.
Impression

SIUP at 30+2 weeks
Polyhydramnios
BPP [DATE]
Recommendations

BPP next week
Growth US in 2 weeks

## 2018-05-28 ENCOUNTER — Ambulatory Visit (INDEPENDENT_AMBULATORY_CARE_PROVIDER_SITE_OTHER): Payer: Medicaid Other | Admitting: *Deleted

## 2018-05-28 DIAGNOSIS — B009 Herpesviral infection, unspecified: Secondary | ICD-10-CM

## 2018-05-28 MED ORDER — VALACYCLOVIR HCL 1 G PO TABS: 1000.0000 mg | ORAL_TABLET | Freq: Two times a day (BID) | ORAL | 3 refills | Status: AC

## 2018-05-28 NOTE — Progress Notes (Signed)
States wants to get treated for herpes- was told she had herpes and treated by us in 2018. States had an outbreak over the weekend . Per chart was seen by Dr. Vergie LivingPickens. Informed her I can send in order for valtrex per protocol to her pharmacy. I instructed her if this does not relieve her symptoms, may call us back for appointment to be seen or come to after hours clinic.

## 2018-05-30 IMAGING — US US MFM FETAL BPP W/O NON-STRESS
1 series · 15 of 18 positions shown · non-contrast
Comparison: none

[Series 1: us mfm fetal bpp w/o non-stress · 18 acquisitions, 15 frames shown]
[im 1/18]
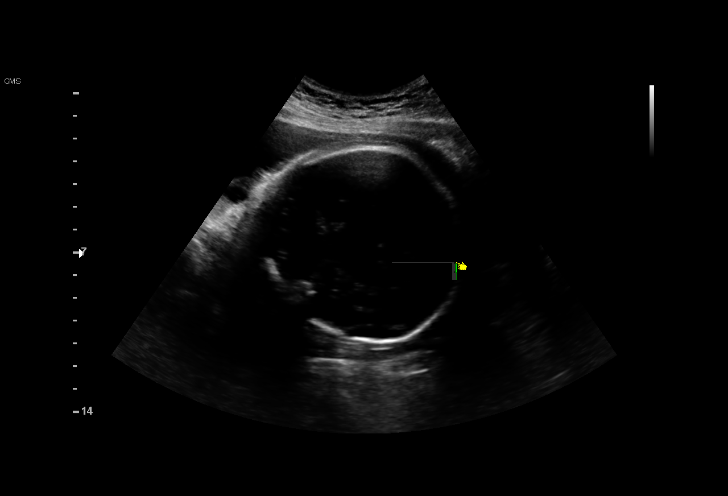
[im 2/18]
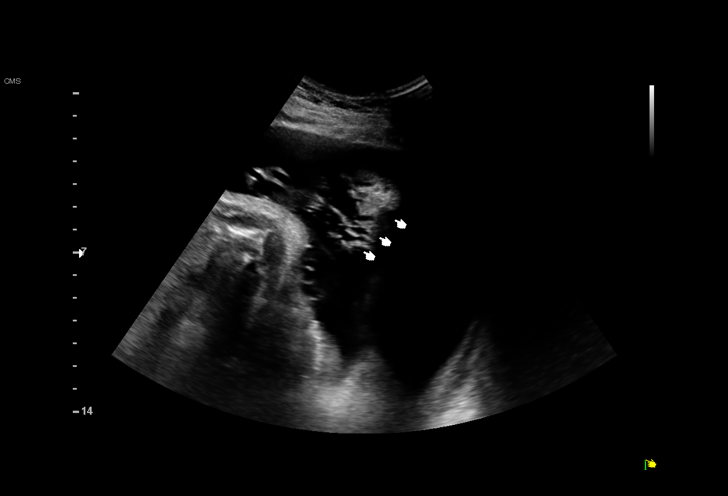
[im 4/18]
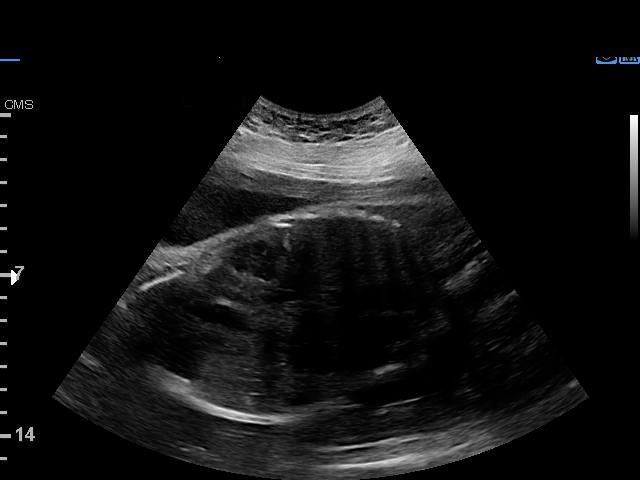
[im 5/18]
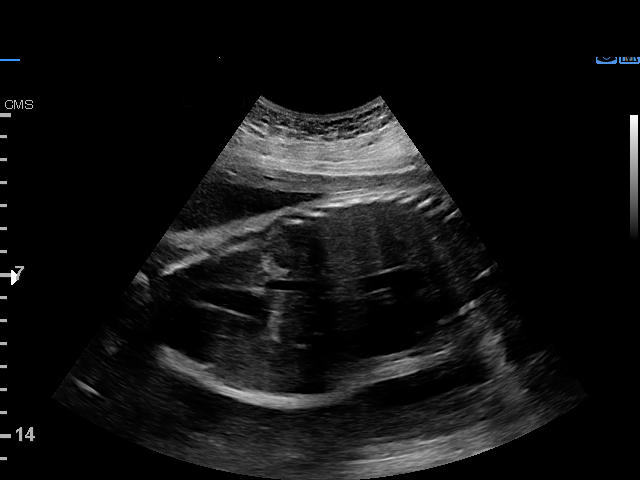
[im 6/18]
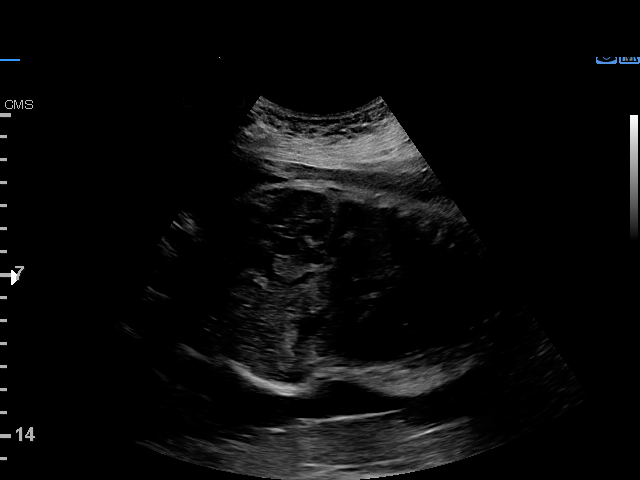
[im 7/18]
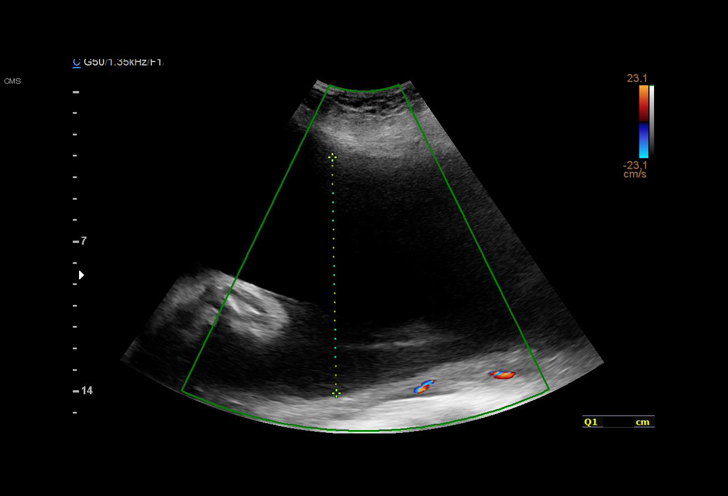
[im 8/18]
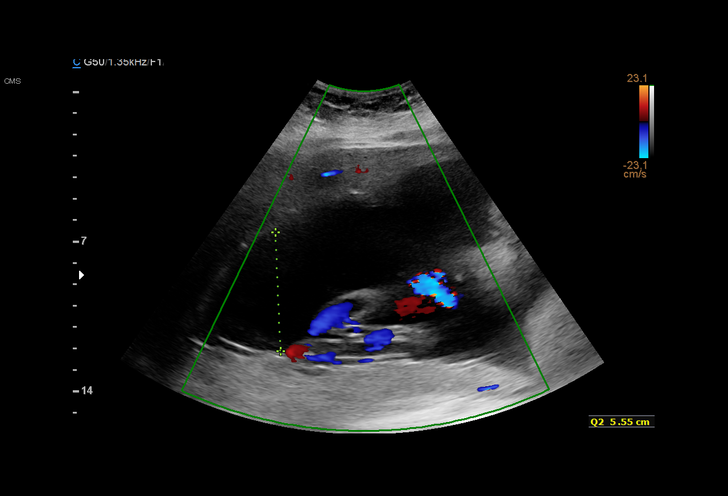
[im 10/18]
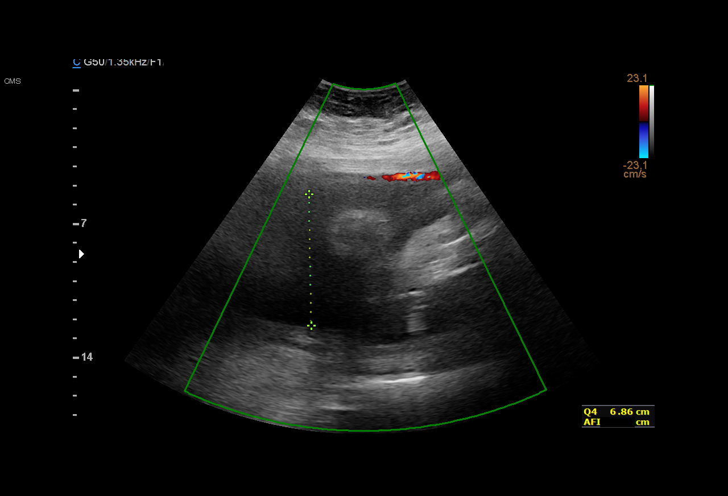
[im 11/18]
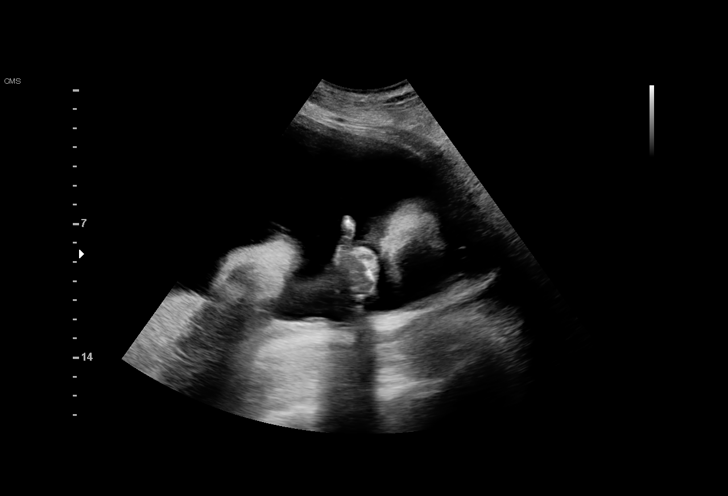
[im 12/18]
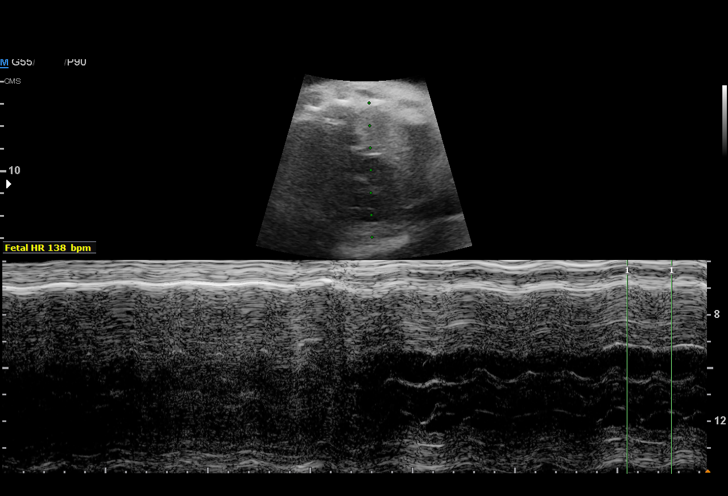
[im 13/18]
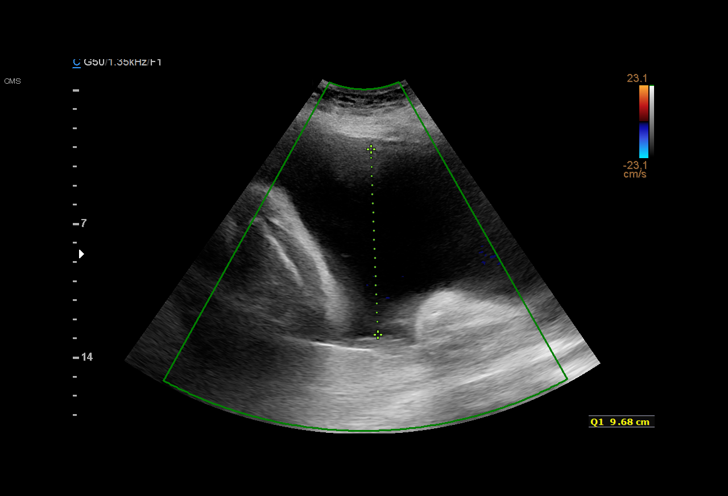
[im 14/18]
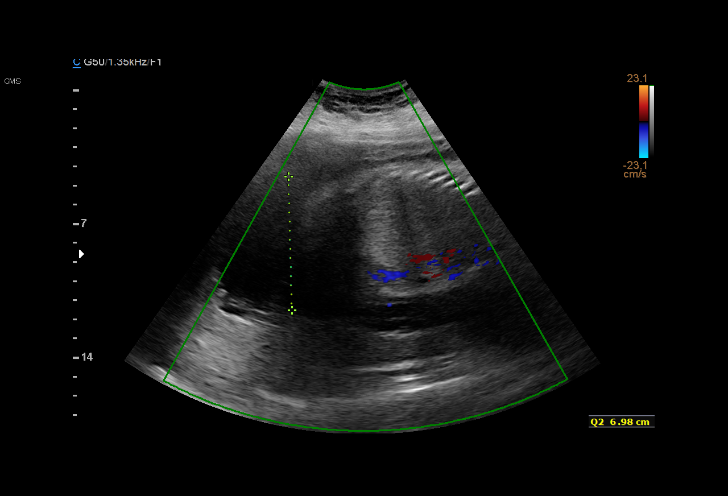
[im 16/18]
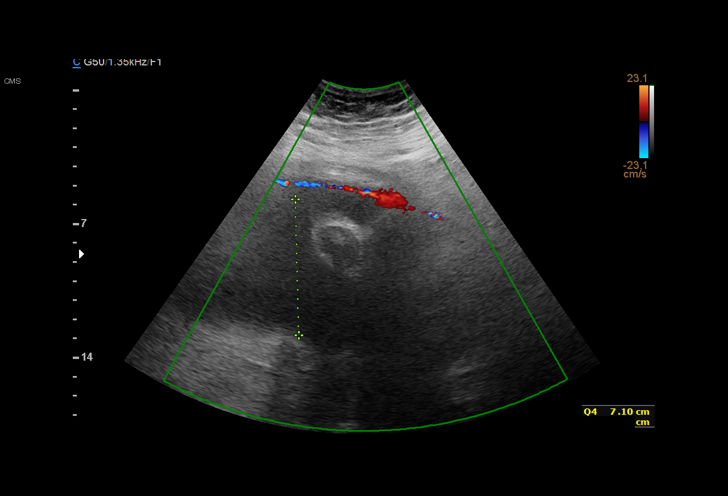
[im 17/18]
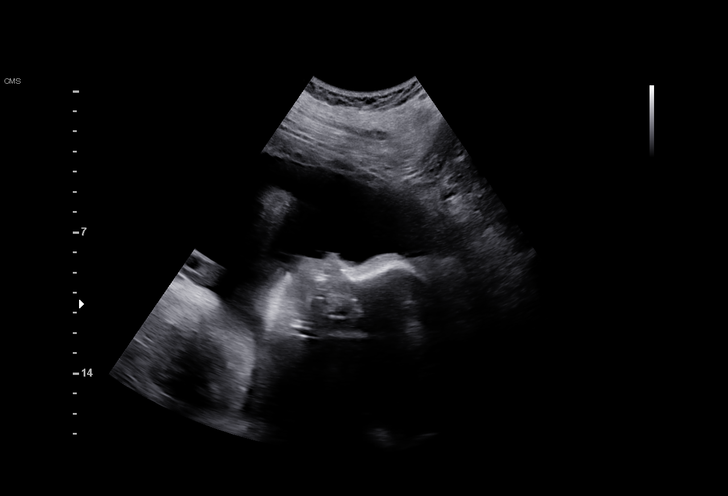
[im 18/18]
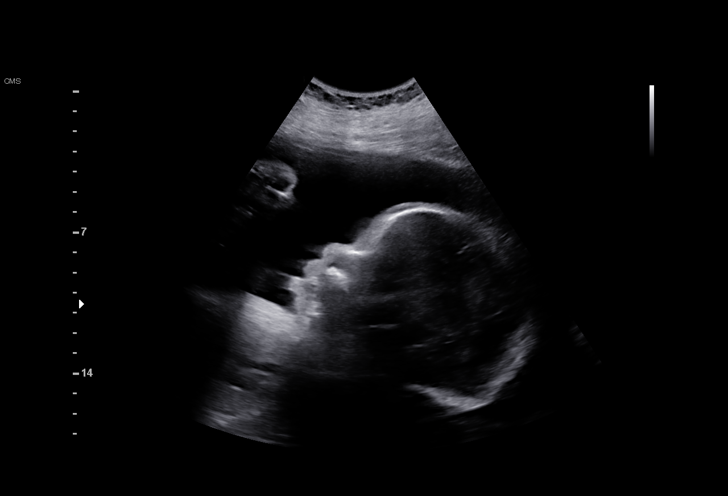

[15 of 18 positions shown; findings below may reference images not displayed]

Faculty Practice
STANIK CNM

1  BELLOU DOX            [id number]      [phone]     [id number]
Indications

31 weeks gestation of pregnancy
Obesity complicating pregnancy, third
trimester
Polyhydramnios, third trimester, antepartum
condition or complication, unspecified fetus
OB History

Blood Type:            Height:  5'6"   Weight (lb):  257      BMI:
Gravidity:    1         Term:   0        Prem:   0        SAB:   0
TOP:          0       Ectopic:  0        Living: 0
Fetal Evaluation

Num Of Fetuses:     1
Fetal Heart         138
Rate(bpm):
Cardiac Activity:   Observed
Presentation:       Cephalic

Amniotic Fluid
AFI FV:      Polyhydramnios

AFI Sum(cm)     %Tile       Largest Pocket(cm)
30.49           > 97

RUQ(cm)       RLQ(cm)       LUQ(cm)        LLQ(cm)
11.01
Biophysical Evaluation

Amniotic F.V:   Polyhydramnios             F. Tone:        Observed
F. Movement:    Observed                   Score:          [DATE]
F. Breathing:   Observed
Gestational Age

LMP:           31w 2d       Date:   07/07/15                 EDD:   04/12/16
Best:          31w 2d    Det. By:   LMP  (07/07/15)          EDD:   04/12/16
Impression

Single IUP at 31w 2d
Obesity, polyhydramnios
Cephalic presentation
BPP [DATE]
Polyhydramnios was again noted (AFI 30.5 cm)
Recommendations

Ultrasound for growth next week
Continue antenatal testing as scheduled
Recommend delivery at 39 weeks in the absence of other
complications

## 2018-05-30 NOTE — Progress Notes (Signed)
I have reviewed this chart and agree with the RN/CMA assessment and management.    [place] Orona C Krystian Ferrentino, MD, FACOG Attending Physician, Faculty Practice [facility]  

## 2018-06-13 IMAGING — US US MFM FETAL BPP W/O NON-STRESS
1 series · 13 of 27 positions shown · non-contrast
Comparison: none

[Series 1: us mfm fetal bpp w/o non-stress · 27 acquisitions, 13 frames shown]
[im 2/27]
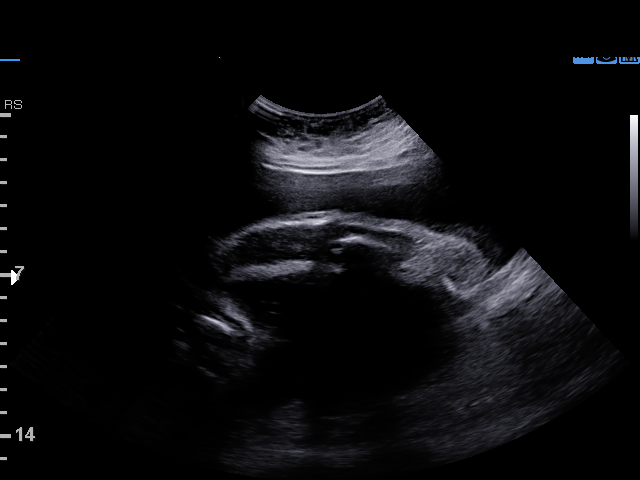
[im 4/27]
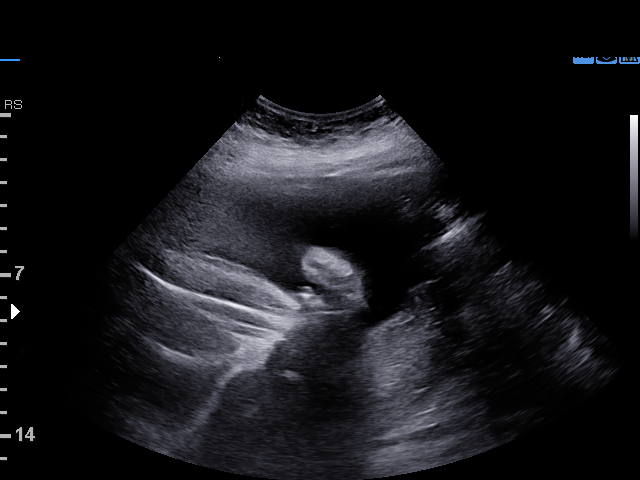
[im 6/27]
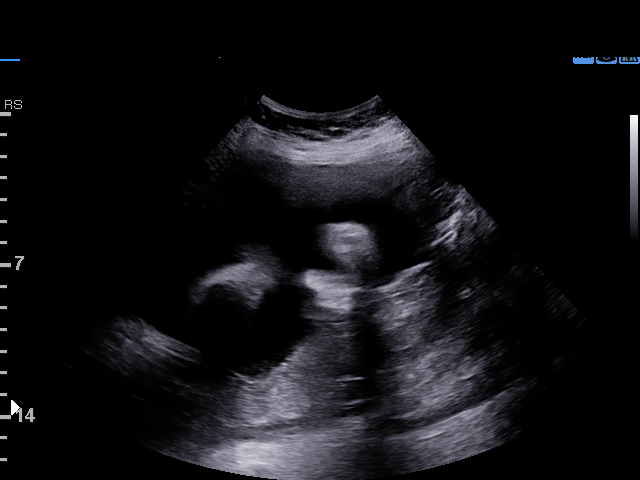
[im 8/27]
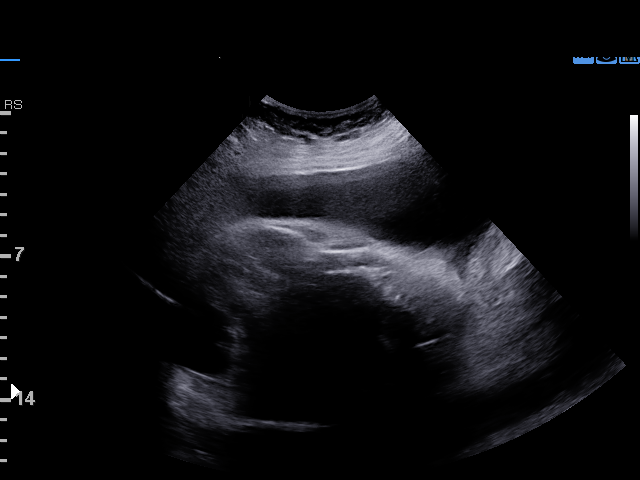
[im 10/27]
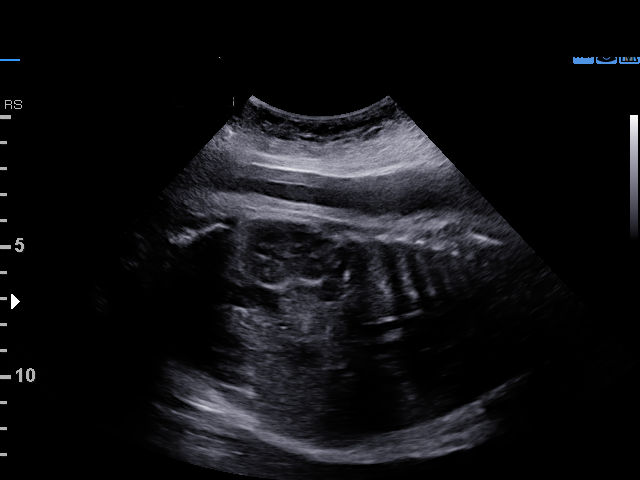
[im 12/27]
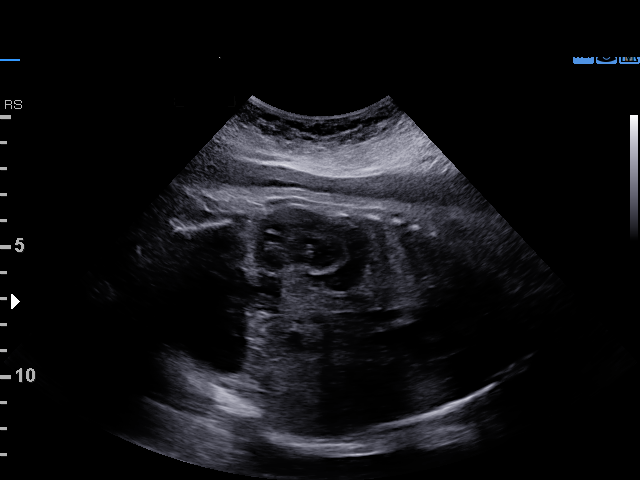
[im 14/27]
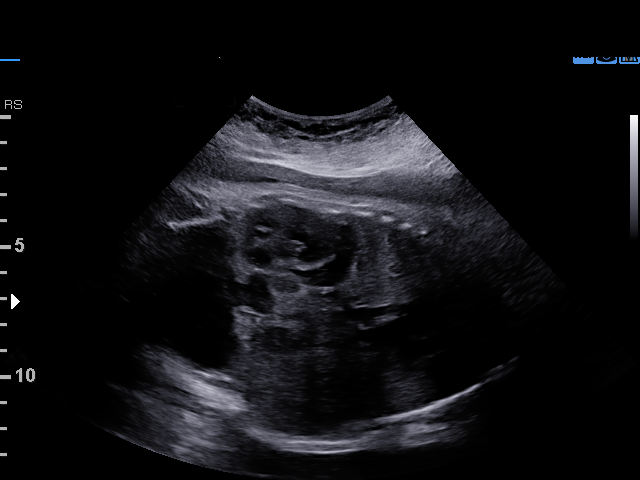
[im 16/27]
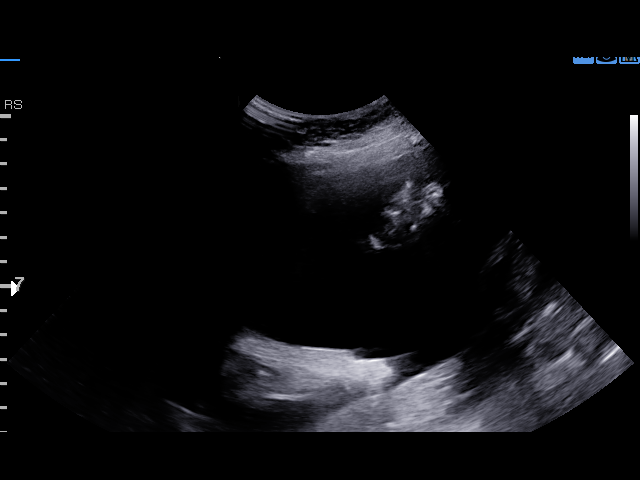
[im 18/27]
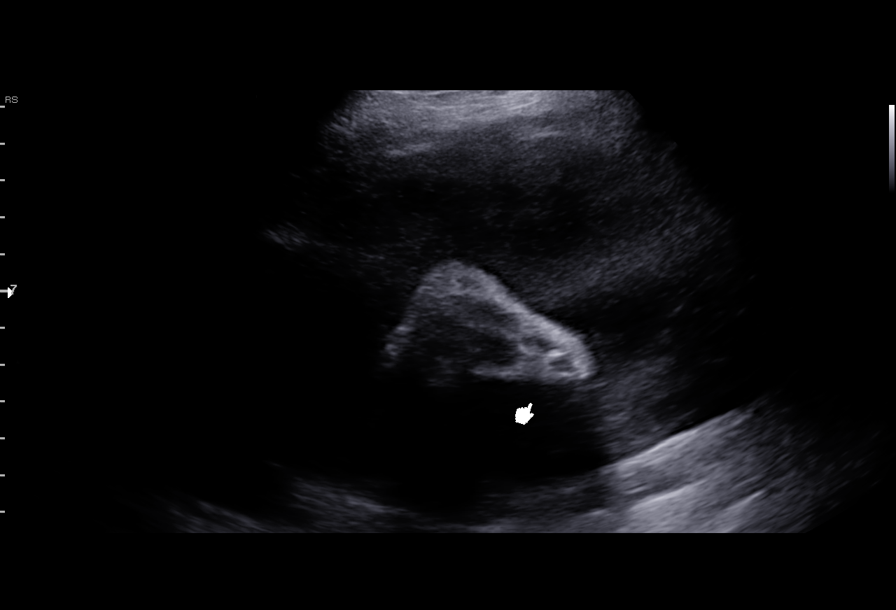
[im 20/27]
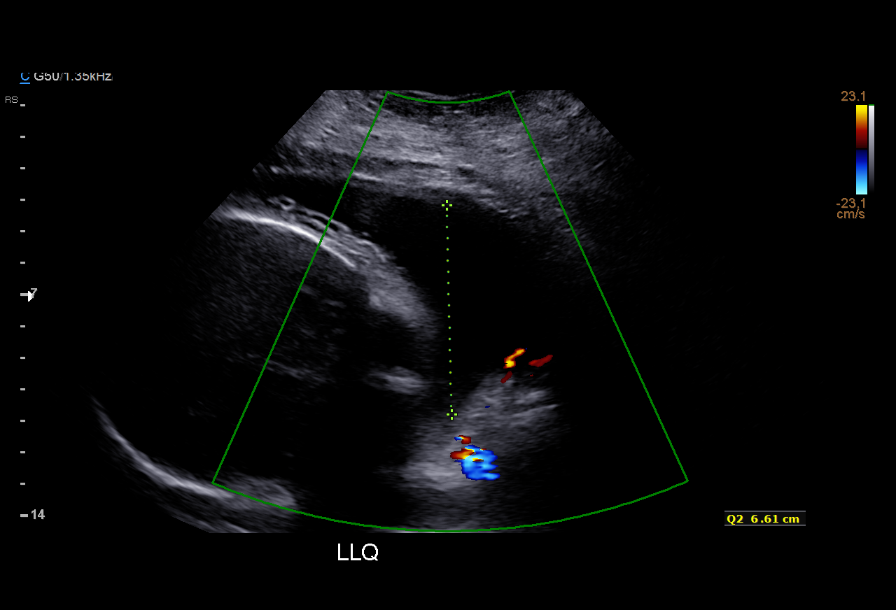
[im 22/27]
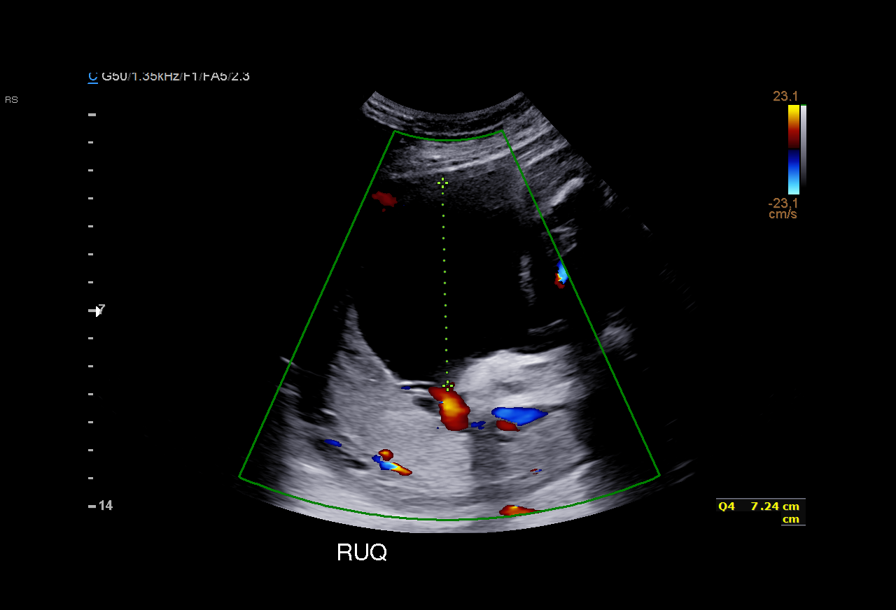
[im 24/27]
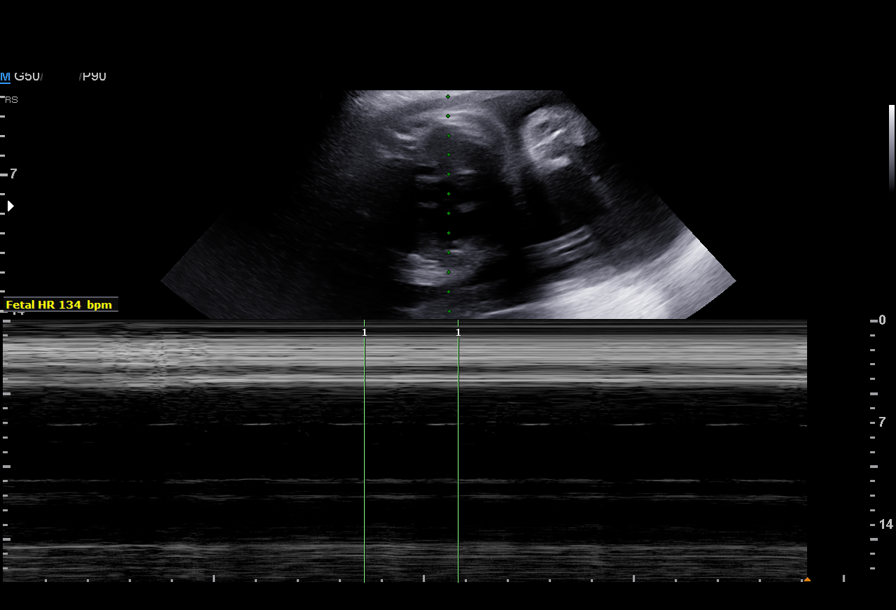
[im 26/27]
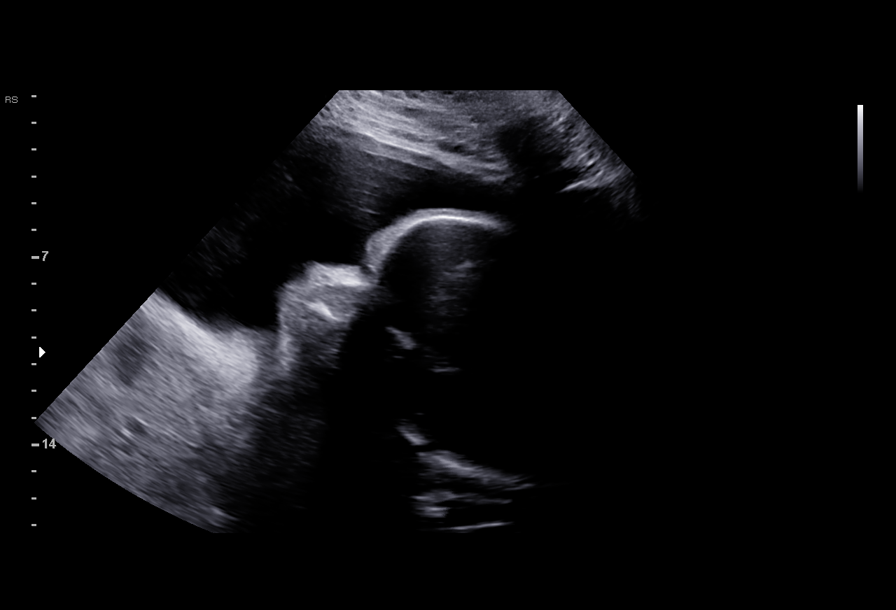

[13 of 27 positions shown; findings below may reference images not displayed]

Faculty Practice
RUDI CNM

1  FARIMAN MALAKOOTI            [id number]      [phone]     [phone]
Indications

33 weeks gestation of pregnancy
Obesity complicating pregnancy, third
trimester
Polyhydramnios, third trimester, antepartum
condition or complication, unspecified fetus
OB History

Blood Type:            Height:  5'6"   Weight (lb):  257       BMI:
Gravidity:    1         Term:   0        Prem:   0         SAB:   0
TOP:          0       Ectopic:  0        Living: 0
Fetal Evaluation

Num Of Fetuses:     1
Fetal Heart         134
Rate(bpm):
Cardiac Activity:   Observed
Presentation:       Cephalic

Amniotic Fluid
AFI FV:      Polyhydramnios

AFI Sum(cm)     %Tile       Largest Pocket(cm)
27.18           97

RUQ(cm)       RLQ(cm)       LUQ(cm)        LLQ(cm)
6.82
Biophysical Evaluation

Amniotic F.V:   Pocket => 2 cm two         F. Tone:         Observed
planes
F. Movement:    Observed                   Score:           [DATE]
F. Breathing:   Observed
Gestational Age

LMP:           33w 2d        Date:  07/07/15                 EDD:    04/12/16
Best:          33w 2d     Det. By:  LMP  (07/07/15)          EDD:    04/12/16
Impression

SIUP at 33+2 weeks
Mild polyhydramnios
BPP [DATE]
Recommendations

Weekly BPPs or twice weekly NSTs with weekly AFIs
Growth US in 3 weeks

## 2018-11-28 ENCOUNTER — Encounter: Payer: Self-pay | Admitting: *Deleted

## 2019-04-07 ENCOUNTER — Emergency Department (HOSPITAL_COMMUNITY): Payer: Medicaid Other

## 2019-04-07 ENCOUNTER — Encounter (HOSPITAL_COMMUNITY): Payer: Self-pay | Admitting: Emergency Medicine

## 2019-04-07 ENCOUNTER — Emergency Department (HOSPITAL_COMMUNITY)
Admission: EM | Admit: 2019-04-07 | Discharge: 2019-04-08 | Disposition: A | Discharge status: Home / Self Care | Payer: Medicaid Other | Attending: Emergency Medicine | Admitting: Emergency Medicine

## 2019-04-07 ENCOUNTER — Other Ambulatory Visit: Payer: Self-pay

## 2019-04-07 DIAGNOSIS — Z20828 Contact with and (suspected) exposure to other viral communicable diseases: Secondary | ICD-10-CM | POA: Insufficient documentation

## 2019-04-07 DIAGNOSIS — N83201 Unspecified ovarian cyst, right side: Secondary | ICD-10-CM | POA: Insufficient documentation

## 2019-04-07 DIAGNOSIS — R1010 Upper abdominal pain, unspecified: Secondary | ICD-10-CM

## 2019-04-07 DIAGNOSIS — Z9884 Bariatric surgery status: Secondary | ICD-10-CM | POA: Diagnosis not present

## 2019-04-07 DIAGNOSIS — R52 Pain, unspecified: Secondary | ICD-10-CM

## 2019-04-07 DIAGNOSIS — R7401 Elevation of levels of liver transaminase levels: Secondary | ICD-10-CM | POA: Diagnosis not present

## 2019-04-07 DIAGNOSIS — R1013 Epigastric pain: Secondary | ICD-10-CM | POA: Diagnosis present

## 2019-04-07 LAB — COMPREHENSIVE METABOLIC PANEL
ALT: 205 U/L — ABNORMAL HIGH (ref 0–44)
AST: 266 U/L — ABNORMAL HIGH (ref 15–41)
Albumin: 3.7 g/dL (ref 3.5–5.0)
Alkaline Phosphatase: 110 U/L (ref 38–126)
Anion gap: 10 (ref 5–15)
BUN: 11 mg/dL (ref 6–20)
CO2: 18 mmol/L — ABNORMAL LOW (ref 22–32)
Calcium: 9.2 mg/dL (ref 8.9–10.3)
Chloride: 110 mmol/L (ref 98–111)
Creatinine, Ser: 0.61 mg/dL (ref 0.44–1.00)
GFR calc Af Amer: >60 mL/min (ref >60)
GFR calc non Af Amer: >60 mL/min (ref >60)
Glucose, Bld: 87 mg/dL (ref 70–99)
Potassium: 4.3 mmol/L (ref 3.5–5.1)
Sodium: 138 mmol/L (ref 135–145)
Total Bilirubin: 0.7 mg/dL (ref 0.3–1.2)
Total Protein: 6.9 g/dL (ref 6.5–8.1)

## 2019-04-07 LAB — CBC
HCT: 32.9 % — ABNORMAL LOW (ref 36.0–46.0)
Hemoglobin: 10 g/dL — ABNORMAL LOW (ref 12.0–15.0)
MCH: 26.2 pg (ref 26.0–34.0)
MCHC: 30.4 g/dL (ref 30.0–36.0)
MCV: 86.1 fL (ref 80.0–100.0)
Platelets: 198 10*3/uL (ref 150–400)
RBC: 3.82 MIL/uL — ABNORMAL LOW (ref 3.87–5.11)
RDW: 14.6 % (ref 11.5–15.5)
WBC: 2.5 10*3/uL — ABNORMAL LOW (ref 4.0–10.5)
nRBC: 0 % (ref 0.0–0.2)

## 2019-04-07 LAB — URINALYSIS, ROUTINE W REFLEX MICROSCOPIC
Bacteria, UA: NONE SEEN
Bilirubin Urine: NEGATIVE
Glucose, UA: NEGATIVE mg/dL
Hgb urine dipstick: NEGATIVE
Ketones, ur: 5 mg/dL — AB
Nitrite: NEGATIVE
Protein, ur: 30 mg/dL — AB
Specific Gravity, Urine: 1.028 (ref 1.005–1.030)
pH: 5 (ref 5.0–8.0)

## 2019-04-07 LAB — I-STAT BETA HCG BLOOD, ED (MC, WL, AP ONLY): I-stat hCG, quantitative: <5 m[IU]/mL (ref <5)

## 2019-04-07 LAB — LIPASE, BLOOD: Lipase: 29 U/L (ref 11–51)

## 2019-04-07 MED ORDER — SODIUM CHLORIDE 0.9% FLUSH
3.0000 mL | Freq: Once | INTRAVENOUS | Status: AC
  Administered 2019-04-07: 3 mL via INTRAVENOUS

## 2019-04-07 NOTE — ED Notes (Signed)
Patient aunt Carrie Cardenas [phone] Would like a call back as an update on patient

## 2019-04-07 NOTE — ED Notes (Signed)
IV team at bedside 

## 2019-04-07 NOTE — ED Notes (Signed)
Stuck pt.x2 no blood return reported to [employer]

## 2019-04-07 NOTE — ED Provider Notes (Addendum)
[facility] EMERGENCY DEPARTMENT Provider Note   CSN: [id number] Arrival date & time: 04/07/19  1159     History   Chief Complaint Chief Complaint  Patient presents with  . Abdominal Pain    HPI Carrie Cardenas is a 28 y.o. female.     The history is provided by the patient. No language interpreter was used.  Abdominal Pain Pain location:  Generalized Pain quality: aching   Pain radiates to:  Epigastric region Pain severity:  Moderate Timing:  Constant Progression:  Worsening Chronicity:  New Relieved by:  Nothing Worsened by:  Nothing Ineffective treatments:  None tried Associated symptoms: no shortness of breath   Pt had a gastric bypass/  Pt reports problems on and off since with vomiting and abdominal pain Pt reports she had the surgery in [place]   Past Medical History:  Diagnosis Date  . Depression    pt hospitalized at 28 years old for major depression  . Hypovolemic shock (HCC)   . Mallory-Weiss tear 04/07/2016  . Retained products of conception, following delivery with hemorrhage 04/05/2016  . Sepsis (HCC)   . Severe preeclampsia, third trimester 03/30/2016    Patient Active Problem List   Diagnosis Date Noted  . Vaginal pain 09/28/2016  . History of gastric bypass   . Acute hypoxemic respiratory failure (HCC)   . LGSIL (low grade squamous intraepithelial dysplasia) 01/19/2016  . Genital warts complicating pregnancy 12/16/2015    Past Surgical History:  Procedure Laterality Date  . DILATION AND EVACUATION N/A 04/05/2016   Procedure: DILATATION AND EVACUATION;  Surgeon: Tereso Newcomer, MD;  Location: [facility] ORS;  Service: Gynecology;  Laterality: N/A;  . ESOPHAGOGASTRODUODENOSCOPY N/A 04/07/2016   Procedure: ESOPHAGOGASTRODUODENOSCOPY (EGD);  Surgeon: Charlott Rakes, MD;  Location: [facility] ENDOSCOPY;  Service: Endoscopy;  Laterality: N/A;  . ESOPHAGOGASTRODUODENOSCOPY N/A 04/07/2016   Procedure: ESOPHAGOGASTRODUODENOSCOPY (EGD);   Surgeon: Charlott Rakes, MD;  Location: [facility] OR;  Service: Endoscopy;  Laterality: N/A;  . ESOPHAGOGASTRODUODENOSCOPY N/A 04/08/2016   Procedure: ESOPHAGOGASTRODUODENOSCOPY (EGD);  Surgeon: Ovidio Kin, MD;  Location: [facility] ENDOSCOPY;  Service: General;  Laterality: N/A;  . GASTRIC BYPASS    . SLEEVE GASTROPLASTY  2015     OB History    Gravida  1   Para  1   Term  1   Preterm  0   AB  0   Living  1     SAB  0   TAB  0   Ectopic  0   Multiple  0   Live Births  1            Home Medications    Prior to Admission medications   Not on File    Family History Family History  Problem Relation Age of Onset  . Fibroids Mother   . Anemia Mother   . Pulmonary embolism Mother   . Cancer Maternal Grandmother     Social History Social History   Tobacco Use  . Smoking status: Never Smoker  . Smokeless tobacco: Never Used  Substance Use Topics  . Alcohol use: No  . Drug use: No     Allergies   Patient has no known allergies.   Review of Systems Review of Systems  Respiratory: Negative for shortness of breath.   Gastrointestinal: Positive for abdominal pain.  All other systems reviewed and are negative.    Physical Exam Updated Vital Signs BP (!) 141/83   Pulse 60   Temp 99.1 F (37.3  C) (Oral)   Resp 16   LMP 03/08/2019   SpO2 100%   Physical Exam Vitals signs and nursing note reviewed.  Constitutional:      Appearance: She is well-developed.  HENT:     Head: Normocephalic.  Eyes:     Extraocular Movements: Extraocular movements intact.  Neck:     Musculoskeletal: Normal range of motion.  Cardiovascular:     Rate and Rhythm: Normal rate and regular rhythm.  Pulmonary:     Effort: Pulmonary effort is normal.  Abdominal:     General: Abdomen is flat. Bowel sounds are normal. There is no distension.     Tenderness: There is generalized abdominal tenderness.  Musculoskeletal: Normal range of motion.  Skin:    General: Skin is warm.   Neurological:     General: No focal deficit present.     Mental Status: She is alert and oriented to person, place, and time.      ED Treatments / Results  Labs (all labs ordered are listed, but only abnormal results are displayed) Labs Reviewed  COMPREHENSIVE METABOLIC PANEL - Abnormal; Notable for the following components:      Result Value   CO2 18 (*)    AST 266 (*)    ALT 205 (*)    All other components within normal limits  CBC - Abnormal; Notable for the following components:   WBC 2.5 (*)    RBC 3.82 (*)    Hemoglobin 10.0 (*)    HCT 32.9 (*)    All other components within normal limits  URINALYSIS, ROUTINE W REFLEX MICROSCOPIC - Abnormal; Notable for the following components:   Color, Urine AMBER (*)    APPearance CLOUDY (*)    Ketones, ur 5 (*)    Protein, ur 30 (*)    Leukocytes,Ua LARGE (*)    All other components within normal limits  LIPASE, BLOOD  I-STAT BETA HCG BLOOD, ED ([facility], [facility], [facility] ONLY)    EKG None  Radiology No results found.  Procedures Procedures (including critical care time)  Medications Ordered in ED Medications  sodium chloride flush (NS) 0.9 % injection 3 mL (has no administration in time range)     Initial Impression / Assessment and Plan / ED Course  I have reviewed the triage vital signs and the nursing notes.  Pertinent labs & imaging results that were available during my care of the patient were reviewed by me and considered in my medical decision making (see chart for details).  Clinical Course as of Apr 08 641  Sun Apr 07, 2019  2353 Difficult IV access.  Will obtain scan as p.o. contrast only without IV.   [EH]    Clinical Course User Index [EH] Lorin Glass, PA-C       Pt's care turned over to [facility]   Ct pending  Final Clinical Impressions(s) / ED Diagnoses   Final diagnoses:  Pain  Transaminitis  Pain of upper abdomen  Cyst of right ovary    ED Discharge Orders         Ordered     sucralfate (CARAFATE) 1 g tablet  4 times daily     04/08/19 0227    pantoprazole (PROTONIX) 20 MG tablet  Daily     04/08/19 0227           Fransico Meadow, PA-C 04/07/19 1847    Fransico Meadow, PA-C 04/09/19 6144    Julianne Rice, MD 04/09/19 2228

## 2019-04-07 NOTE — Discharge Instructions (Addendum)
You need to follow-up with the wellness clinic about your liver results. Please do not take any Tylenol or drink alcohol.  Both of these can harm your liver.  We did discuss that you have been taking ibuprofen frequently.  Ibuprofen can cause your stomach to be upset.  It is very important to take this medication with food so that you do not increase your risk for an ulcer.  To treat your upper abdominal pain, take 1 tablet of pantoprazole daily for the next 14 days.  Carafate every 6 hours for the next few weeks.  As we discussed you have a test sent for coronavirus.  It is important that you quarantine yourself at home until you get your results.  Your ultrasound also showed that you have a cyst on your ovary.  I have given you follow-up information for the [facility] outpatient clinic.  Please schedule a follow-up appointment with them.  The ultrasound of your liver and gallbladder was unremarkable and did not show infection or a reason for your liver enzymes to be elevated.

## 2019-04-07 NOTE — ED Notes (Signed)
Pt is still waiting for a MD to come & do an [place] IV after the IV team was not able to get one as well as a [facility] PA.

## 2019-04-07 NOTE — ED Provider Notes (Signed)
I assumed care of patient from [facility] at shift change, please see her note for full H&P.  Briefly patient is here for evaluation of abdominal pain.  She states that it worsens after she eats in her upper abdomen.  Pain since last October intermittently.  She does report that she had a gastric bypass surgery in [place].    Physical Exam  BP (!) 141/83   Pulse 60   Temp 99.1 F (37.3 C) (Oral)   Resp 16   LMP 03/08/2019   SpO2 100%   Physical Exam Vitals signs and nursing note reviewed.  Constitutional:      General: She is not in acute distress. Abdominal:     Tenderness: There is abdominal tenderness in the right upper quadrant, epigastric area and left upper quadrant. There is no guarding.  Neurological:     Mental Status: She is alert.     ED Course/Procedures   Clinical Course as of Apr 07 53  Sun Apr 07, 2019  5176 Difficult IV access.  Will obtain scan as p.o. contrast only without IV.   [EH]    Clinical Course User Index [EH] Lorin Glass, PA-C    Procedures Labs Reviewed  COMPREHENSIVE METABOLIC PANEL - Abnormal; Notable for the following components:      Result Value   CO2 18 (*)    AST 266 (*)    ALT 205 (*)    All other components within normal limits  CBC - Abnormal; Notable for the following components:   WBC 2.5 (*)    RBC 3.82 (*)    Hemoglobin 10.0 (*)    HCT 32.9 (*)    All other components within normal limits  URINALYSIS, ROUTINE W REFLEX MICROSCOPIC - Abnormal; Notable for the following components:   Color, Urine AMBER (*)    APPearance CLOUDY (*)    Ketones, ur 5 (*)    Protein, ur 30 (*)    Leukocytes,Ua LARGE (*)    All other components within normal limits  NOVEL CORONAVIRUS, NAA (HOSP ORDER, SEND-OUT TO REF LAB; TAT 18-24 HRS)  LIPASE, BLOOD  HEPATITIS PANEL, ACUTE  ACETAMINOPHEN LEVEL  I-STAT BETA HCG BLOOD, ED ([facility], [facility], [facility] ONLY)    Ct Abdomen Pelvis Wo Contrast  Result Date: 04/07/2019 CLINICAL DATA:  Abdominal  pain EXAM: CT ABDOMEN AND PELVIS WITHOUT CONTRAST TECHNIQUE: Multidetector CT imaging of the abdomen and pelvis was performed following the standard protocol without IV contrast. Oral contrast was received. COMPARISON:  None. FINDINGS: Lower chest: The visualized heart size within normal limits. No pericardial fluid/thickening. No hiatal hernia. The visualized portions of the lungs are clear. Hepatobiliary: Although limited due to the lack of intravenous contrast, normal in appearance without gross focal abnormality. No evidence of calcified gallstones or biliary ductal dilatation. Pancreas:  Unremarkable.  No surrounding inflammatory changes. Spleen: Normal in size. Although limited due to the lack of intravenous contrast, normal in appearance. Adrenals/Urinary Tract: Both adrenal glands appear normal. The kidneys and collecting system appear normal without evidence of urinary tract calculus or hydronephrosis. Bladder is unremarkable. Vicarious contrast excretion seen within the collecting systems. Stomach/Bowel: The patient is status post gastric bypass. The small bowel and colon are normal in size and contour. The appendix is unremarkable. Vascular/Lymphatic: There are no enlarged abdominal or pelvic lymph nodes. No significant gross vascular findings are present. Reproductive: Within the right adnexa there is a 4.7 cm low-density lesion, likely ovarian/paraovarian cyst. Other: No evidence of abdominal wall mass  or hernia. Musculoskeletal: No acute or significant osseous findings. IMPRESSION: 4.7 cm right adnexal lesion, likely ovarian/paraovarian cyst. No other acute intra-abdominal or pelvic pathology to explain the patient's symptoms. Electronically Signed   By: Jonna Clark M.D.   On: 04/07/2019 22:52    MDM  Plan is to follow-up on CT scan.  There was delay in getting CT scan as patient is extremely difficult IV access. CT scan was obtained with p.o. contrast, not IV. CT scan shows a right-sided  adnexal cyst. Labs do show transaminitis and leukopenia.  I discussed with patient additional evaluation including right upper quadrant ultrasound, and acute hepatitis panel, acetaminophen levels.  We discussed the risks of refusing these including, but not limited to, worsening condition, organ failure, pain, and death.  She states her understanding of these and does not wish for this at this time.  After I had discharge patient she decided that she does wish for these to be done.  She refused additional sticks for blood draws for acute hepatitis panel and Tylenol level.  She denies excessive Tylenol use.  At shift change care was transferred to [facility] who will follow pending studies, re-evaulate and determine disposition.        Cristina Gong, PA-C 04/08/19 0057    Charlynne Pander, MD 04/09/19 [phone]

## 2019-04-07 NOTE — ED Triage Notes (Signed)
Pt reports lower abd pain and back pain for 2 days. Describes it as labor pains but states she had her period last month. States pain worsens after eating.

## 2019-04-08 ENCOUNTER — Emergency Department (HOSPITAL_COMMUNITY): Payer: Medicaid Other

## 2019-04-08 MED ORDER — SUCRALFATE 1 G PO TABS: 1.0000 g | ORAL_TABLET | Freq: Four times a day (QID) | ORAL | 0 refills | Status: DC

## 2019-04-08 MED ORDER — PANTOPRAZOLE SODIUM 20 MG PO TBEC: 20.0000 mg | DELAYED_RELEASE_TABLET | Freq: Every day | ORAL | 0 refills | Status: DC

## 2019-04-08 NOTE — ED Provider Notes (Addendum)
28 year old female received a signout from [place].  Please see her note or PA Sofia's note for complete HPI.  In brief, the patient has been having postprandial upper abdominal pain intermittently for the last year.  Past medical history includes gastric bypass surgery in [place].  She presents today.  On my evaluation, she does report that she takes Tylenol and ibuprofen almost daily and has been for some time due to pain.    Physical Exam  BP (!) 141/83   Pulse 60   Temp 99.1 F (37.3 C) (Oral)   Resp 16   LMP 03/08/2019   SpO2 100%   Physical Exam Vitals signs and nursing note reviewed.  Constitutional:      General: She is not in acute distress.    Appearance: She is well-developed. She is obese. She is not ill-appearing or toxic-appearing.  HENT:     Head: Normocephalic and atraumatic.  Neck:     Musculoskeletal: Normal range of motion.  Musculoskeletal: Normal range of motion.  Neurological:     Mental Status: She is alert and oriented to person, place, and time.     ED Course/Procedures   Clinical Course as of Apr 08 231  Sun Apr 07, 2019  6270 Difficult IV access.  Will obtain scan as p.o. contrast only without IV.   [EH]    Clinical Course User Index [EH] Lorin Glass, PA-C    Procedures  MDM   28 year old female received a signout from [place] pending right upper quadrant ultrasound.  Please see her note and PA Sofia's note for full work-up and medical decision making.  Right upper quadrant ultrasound is unremarkable.  Unfortunately, the patient refused further lab draws for a Tylenol level and hepatitis panel as she is a difficult stick.  Will be advised to follow-up in the outpatient setting to attempt to have these labs redrawn.  She will also be given an OB/GYN follow-up for presumed cyst of her right ovary.  She has strongly been advised to avoid alcohol and Tylenol until her LFTs have been redrawn.  We discussed at length that she needs to  follow-up to have these redrawn within the next week.  During my evaluation, the patient does also report frequent, daily Tylenol and ibuprofen use.  Since she is having postprandial pain, GERD be reasonable to start her on Carafate and pantoprazole for possible peptic ulcer.  She will also be given a referral to gastroenterology.  ER return precautions given.  She is hemodynamically stable in no acute distress.  Safe for discharge home with outpatient follow-up as indicated.   Joanne Gavel, PA-C 04/08/19 0232    Fatima Blank, MD 04/08/19 [place], PA-C 04/09/19 3500    Fatima Blank, MD 04/17/19 [phone]

## 2019-04-09 LAB — NOVEL CORONAVIRUS, NAA (HOSP ORDER, SEND-OUT TO REF LAB; TAT 18-24 HRS): SARS-CoV-2, NAA: NOT DETECTED

## 2019-09-17 ENCOUNTER — Encounter: Payer: Self-pay | Admitting: Family Medicine

## 2019-09-17 ENCOUNTER — Other Ambulatory Visit: Payer: Self-pay

## 2019-09-17 ENCOUNTER — Ambulatory Visit (INDEPENDENT_AMBULATORY_CARE_PROVIDER_SITE_OTHER): Payer: Medicaid Other

## 2019-09-17 DIAGNOSIS — Z3201 Encounter for pregnancy test, result positive: Secondary | ICD-10-CM

## 2019-09-17 LAB — POCT PREGNANCY, URINE: Preg Test, Ur: POSITIVE — AB

## 2019-09-17 NOTE — Progress Notes (Signed)
Pt here today for pregnancy test.  Resulted positive.  Pt reports LMP was 06/11/2019 which makes her EDD 03/17/20 and 14w today.  Pt denies any pain or bleeding.  Medications/allergies reviewed.  List of medications safe to take in pregnancy given to pt.  Front office to provide proof of pregnancy letter.    Addison Naegeli, RN 09/17/19

## 2019-10-15 ENCOUNTER — Ambulatory Visit (INDEPENDENT_AMBULATORY_CARE_PROVIDER_SITE_OTHER): Payer: Medicaid Other | Admitting: *Deleted

## 2019-10-15 ENCOUNTER — Other Ambulatory Visit: Payer: Self-pay

## 2019-10-15 DIAGNOSIS — O09299 Supervision of pregnancy with other poor reproductive or obstetric history, unspecified trimester: Secondary | ICD-10-CM

## 2019-10-15 DIAGNOSIS — A6009 Herpesviral infection of other urogenital tract: Secondary | ICD-10-CM

## 2019-10-15 DIAGNOSIS — O0990 Supervision of high risk pregnancy, unspecified, unspecified trimester: Secondary | ICD-10-CM

## 2019-10-15 DIAGNOSIS — Z8619 Personal history of other infectious and parasitic diseases: Secondary | ICD-10-CM | POA: Insufficient documentation

## 2019-10-15 DIAGNOSIS — O99210 Obesity complicating pregnancy, unspecified trimester: Secondary | ICD-10-CM

## 2019-10-15 DIAGNOSIS — F419 Anxiety disorder, unspecified: Secondary | ICD-10-CM

## 2019-10-15 MED ORDER — BLOOD PRESSURE KIT DEVI: 1.0000 | 0 refills | Status: DC | PRN

## 2019-10-15 NOTE — Progress Notes (Signed)
I connected with  Carrie Cardenas on 10/15/19 at  1:30 PM EDT by telephone and verified that I am speaking with the correct person using two identifiers.   I discussed the limitations, risks, security and privacy concerns of performing an evaluation and management service by telephone and the availability of in person appointments. I also discussed with the patient that there may be a patient responsible charge related to this service. The patient expressed understanding and agreed to proceed.  I explained I am completing her New OB Intake today. We discussed Her EDD and that it is based on  sure LMP . I reviewed her allergies, meds, OB History, Medical /Surgical history, and appropriate screenings. I informed her of [facility] services. She does report having a lot of stress and phq9 and gad7 both=9. I offered her [facility] referral which she accepted.   Patient and/or legal guardian verbally consented to [facility] services about presenting concerns and psychiatric consultation as appropriate.   I explained I will send her the [employer] app and app was sent to her while on phone.  I explained we will send a blood pressure cuff to [facility] that will fill that prescription and we called [facility] to verify they received her prescription and confirmed they will deliver to her home.  I asked her to bring the blood pressure cuff with her to her first ob appointment so we can show her how to use it. Explained  then we will have her take her blood pressure weekly and enter into the app. I explained she will have some visits in office and some virtually. I sent her a text to sign up for MyChart app and assisted her with sign up. I reviewed her new ob  appointment date/ time with her , our location and to wear mask, no visitors.  I explained she will have a pelvic exam, ob bloodwork, hemoglobin a1C, cbg ,pap, and  genetic testing if desired,- she does want a panorama. I scheduled an Korea at 19  weeks and gave her the appointment. She voices understanding.   Hermen Mario,RN 10/15/2019  1:31 PM

## 2019-10-15 NOTE — Patient Instructions (Signed)

## 2019-10-16 NOTE — Progress Notes (Signed)
Patient seen and assessed by nursing staff during this encounter. I have reviewed the chart and agree with the documentation and plan. I have also made any necessary editorial changes.  Catalina Antigua, MD 10/16/2019 4:11 PM

## 2019-10-21 ENCOUNTER — Other Ambulatory Visit: Payer: Self-pay

## 2019-10-21 ENCOUNTER — Encounter (HOSPITAL_COMMUNITY): Payer: Self-pay | Admitting: Obstetrics and Gynecology

## 2019-10-21 ENCOUNTER — Inpatient Hospital Stay (HOSPITAL_COMMUNITY)
Admission: AD | Admit: 2019-10-21 | Discharge: 2019-10-21 | Disposition: A | Discharge status: Home / Self Care | Payer: Medicaid Other | Attending: Obstetrics and Gynecology | Admitting: Obstetrics and Gynecology

## 2019-10-21 DIAGNOSIS — R109 Unspecified abdominal pain: Secondary | ICD-10-CM | POA: Diagnosis present

## 2019-10-21 DIAGNOSIS — O210 Mild hyperemesis gravidarum: Secondary | ICD-10-CM | POA: Insufficient documentation

## 2019-10-21 DIAGNOSIS — O219 Vomiting of pregnancy, unspecified: Secondary | ICD-10-CM

## 2019-10-21 DIAGNOSIS — Z3A18 18 weeks gestation of pregnancy: Secondary | ICD-10-CM

## 2019-10-21 DIAGNOSIS — D649 Anemia, unspecified: Secondary | ICD-10-CM | POA: Insufficient documentation

## 2019-10-21 DIAGNOSIS — O99012 Anemia complicating pregnancy, second trimester: Secondary | ICD-10-CM | POA: Diagnosis not present

## 2019-10-21 LAB — URINALYSIS, ROUTINE W REFLEX MICROSCOPIC
Bilirubin Urine: NEGATIVE
Glucose, UA: NEGATIVE mg/dL
Hgb urine dipstick: NEGATIVE
Ketones, ur: NEGATIVE mg/dL
Nitrite: NEGATIVE
Protein, ur: NEGATIVE mg/dL
Specific Gravity, Urine: 1.024 (ref 1.005–1.030)
pH: 6 (ref 5.0–8.0)

## 2019-10-21 LAB — COMPREHENSIVE METABOLIC PANEL
ALT: 11 U/L (ref 0–44)
AST: 13 U/L — ABNORMAL LOW (ref 15–41)
Albumin: 2.7 g/dL — ABNORMAL LOW (ref 3.5–5.0)
Alkaline Phosphatase: 36 U/L — ABNORMAL LOW (ref 38–126)
Anion gap: 6 (ref 5–15)
BUN: 12 mg/dL (ref 6–20)
CO2: 23 mmol/L (ref 22–32)
Calcium: 8.7 mg/dL — ABNORMAL LOW (ref 8.9–10.3)
Chloride: 110 mmol/L (ref 98–111)
Creatinine, Ser: 0.55 mg/dL (ref 0.44–1.00)
GFR calc Af Amer: >60 mL/min (ref >60)
GFR calc non Af Amer: >60 mL/min (ref >60)
Glucose, Bld: 70 mg/dL (ref 70–99)
Potassium: 3.8 mmol/L (ref 3.5–5.1)
Sodium: 139 mmol/L (ref 135–145)
Total Bilirubin: 0.4 mg/dL (ref 0.3–1.2)
Total Protein: 5.8 g/dL — ABNORMAL LOW (ref 6.5–8.1)

## 2019-10-21 LAB — CBC WITH DIFFERENTIAL/PLATELET
Abs Immature Granulocytes: 0.02 10*3/uL (ref 0.00–0.07)
Basophils Absolute: 0 10*3/uL (ref 0.0–0.1)
Basophils Relative: 0 %
Eosinophils Absolute: 0 10*3/uL (ref 0.0–0.5)
Eosinophils Relative: 1 %
HCT: 27.1 % — ABNORMAL LOW (ref 36.0–46.0)
Hemoglobin: 8.4 g/dL — ABNORMAL LOW (ref 12.0–15.0)
Immature Granulocytes: 1 %
Lymphocytes Relative: 23 %
Lymphs Abs: 1 10*3/uL (ref 0.7–4.0)
MCH: 26.4 pg (ref 26.0–34.0)
MCHC: 31 g/dL (ref 30.0–36.0)
MCV: 85.2 fL (ref 80.0–100.0)
Monocytes Absolute: 0.4 10*3/uL (ref 0.1–1.0)
Monocytes Relative: 9 %
Neutro Abs: 2.9 10*3/uL (ref 1.7–7.7)
Neutrophils Relative %: 66 %
Platelets: 192 10*3/uL (ref 150–400)
RBC: 3.18 MIL/uL — ABNORMAL LOW (ref 3.87–5.11)
RDW: 14.6 % (ref 11.5–15.5)
WBC: 4.3 10*3/uL (ref 4.0–10.5)
nRBC: 0 % (ref 0.0–0.2)

## 2019-10-21 LAB — LIPASE, BLOOD: Lipase: 24 U/L (ref 11–51)

## 2019-10-21 MED ORDER — ONDANSETRON 4 MG PO TBDP
8.0000 mg | ORAL_TABLET | Freq: Once | ORAL | Status: AC
  Administered 2019-10-21: 8 mg via ORAL
  Filled 2019-10-21: qty 2

## 2019-10-21 MED ORDER — FERROUS SULFATE 325 (65 FE) MG PO TABS: 325.0000 mg | ORAL_TABLET | Freq: Two times a day (BID) | ORAL | 1 refills | Status: DC

## 2019-10-21 MED ORDER — ONDANSETRON 4 MG PO TBDP
4.0000 mg | ORAL_TABLET | Freq: Three times a day (TID) | ORAL | 0 refills | Status: DC | PRN
Start: 2019-10-21 — End: 2020-01-17

## 2019-10-21 NOTE — MAU Provider Note (Signed)
Chief Complaint: Abdominal Pain and Nausea   First Provider Initiated Contact with Patient 10/21/19 1520     SUBJECTIVE HPI: Carrie Cardenas is a 29 y.o. G2P1001 at 84w6dwho presents to Maternity Admissions reporting abdominal pain & nausea. Symptoms started yesterday. Reports intermittent epigastric cramping. Vomited some last night & continues to have some nausea. Nothing makes pain better or worse. Able to eat a hamburger & lunchable today without vomiting or pain. Denies fever/chills, diarrhea, constipation, dysuria, vaginal bleeding, LOF, or vaginal bleeding. Has her new   Location: epigastric area Quality: cramping Severity: currently 0/10 on pain scale Duration: 2 days Timing: intermittent Modifying factors: none Associated signs and symptoms: n/v  Past Medical History:  Diagnosis Date  . Depression    pt hospitalized at 29years old for major depression  . Herpes genitalis in women   . [place]   . Mallory-Weiss tear 04/07/2016  . Retained products of conception, following delivery with hemorrhage 04/05/2016  . Sepsis ([place]   . Severe preeclampsia, third trimester 03/30/2016   OB History  Gravida Para Term Preterm AB Living  _0 0 0 1  SAB TAB Ectopic Multiple Live Births  0 0 0 0 1    # Outcome Date GA Lbr Len/2nd Weight Sex Delivery Anes PTL Lv  2 Current           1 Term 04/01/16 32w5d5:35 / 00:48 3035 g M Vag-Spont EPI  LIV     Birth Comments: preeclampsia, postpartum hemorrhage and retained poducts of conception, sepsis   Past Surgical History:  Procedure Laterality Date  . DILATION AND EVACUATION N/A 04/05/2016   Procedure: DILATATION AND EVACUATION;  Surgeon: UgOsborne OmanMD;  [facility];  Service: Gynecology;  Laterality: N/A;  . ESOPHAGOGASTRODUODENOSCOPY N/A 04/07/2016   Procedure: ESOPHAGOGASTRODUODENOSCOPY (EGD);  Surgeon: ViWilford CornerMD;  [facility];  Service: Endoscopy;  Laterality: N/A;  .  ESOPHAGOGASTRODUODENOSCOPY N/A 04/07/2016   Procedure: ESOPHAGOGASTRODUODENOSCOPY (EGD);  Surgeon: ViWilford CornerMD;  [place] [facility] Service: Endoscopy;  Laterality: N/A;  . ESOPHAGOGASTRODUODENOSCOPY N/A 04/08/2016   Procedure: ESOPHAGOGASTRODUODENOSCOPY (EGD);  Surgeon: DaAlphonsa OverallMD;  [place] Service: General;  Laterality: N/A;  . GASTRIC BYPASS    . SLEEVE GASTROPLASTY  2015   Social History   Socioeconomic History  . Marital status: Single    Spouse name: Not on file  . Number of children: Not on file  . Years of education: Not on file  . Highest education level: Not on file  Occupational History  . Not on file  Tobacco Use  . Smoking status: Never Smoker  . Smokeless tobacco: Never Used  Substance and Sexual Activity  . Alcohol use: No  . Drug use: No  . Sexual activity: Yes    Birth control/protection: None  Other Topics Concern  . Not on file  Social History Narrative  . Not on file   Social Determinants of Health   Financial Resource Strain:   . Difficulty of Paying Living Expenses:   Food Insecurity: No Food Insecurity  . Worried About RuCharity fundraisern the Last Year: Never true  . Ran Out of Food in the Last Year: Never true  Transportation Needs: No Transportation Needs  . Lack of Transportation (Medical): No  . Lack of Transportation (Non-Medical): No  Physical Activity:   . Days of Exercise per Week:   . Minutes of Exercise per Session:   Stress:   .  Feeling of Stress :   Social Connections:   . Frequency of Communication with Friends and Family:   . Frequency of Social Gatherings with Friends and Family:   . Attends Religious Services:   . Active Member of Clubs or Organizations:   . Attends Archivist Meetings:   Marland Kitchen Marital Status:   Intimate Partner Violence:   . Fear of Current or Ex-Partner:   . Emotionally Abused:   Marland Kitchen Physically Abused:   . Sexually Abused:    Family History  Problem Relation Age  of Onset  . Fibroids Mother   . Anemia Mother   . Pulmonary embolism Mother   . Cancer Maternal Grandmother    No current facility-administered medications on file prior to encounter.   Current Outpatient Medications on File Prior to Encounter  Medication Sig Dispense Refill  . Prenatal Vit-Fe Fumarate-FA (PREPLUS) 27-1 MG TABS Take 1 tablet by mouth daily.    . Blood Pressure Monitoring (BLOOD PRESSURE KIT) DEVI 1 Device by Does not apply route as needed. 1 each 0   No Known Allergies  I have reviewed patient's Past Medical Hx, Surgical Hx, Family Hx, Social Hx, medications and allergies.   Review of Systems  Constitutional: Negative.   Gastrointestinal: Positive for abdominal pain, nausea and vomiting. Negative for constipation and diarrhea.  Genitourinary: Negative.     OBJECTIVE Patient Vitals for the past 24 hrs:  BP Temp Temp src Pulse Resp SpO2  10/21/19 1724 134/73 -- -- 84 17 --  10/21/19 1556 124/73 -- -- 89 -- --  10/21/19 1458 137/60 98.4 F (36.9 C) Oral 72 17 100 %   Constitutional: Well-developed, well-nourished female in no acute distress.  Cardiovascular: normal rate & rhythm, no murmur Respiratory: normal rate and effort. Lung sounds clear throughout GI: Abd soft, non-tender, Pos BS x 4. No guarding or rebound tenderness MS: Extremities nontender, no edema, normal ROM Neurologic: Alert and oriented x 4.    LAB RESULTS Results for orders placed or performed during the hospital encounter of 10/21/19 (from the past 24 hour(s))  Urinalysis, Routine w reflex microscopic     Status: Abnormal   Collection Time: 10/21/19  3:30 PM  Result Value Ref Range   Color, Urine YELLOW YELLOW   APPearance CLOUDY (A) CLEAR   Specific Gravity, Urine 1.024 1.005 - 1.030   pH 6.0 5.0 - 8.0   Glucose, UA NEGATIVE NEGATIVE mg/dL   Hgb urine dipstick NEGATIVE NEGATIVE   Bilirubin Urine NEGATIVE NEGATIVE   Ketones, ur NEGATIVE NEGATIVE mg/dL   Protein, ur NEGATIVE NEGATIVE  mg/dL   Nitrite NEGATIVE NEGATIVE   Leukocytes,Ua LARGE (A) NEGATIVE   RBC / HPF 0-5 0 - 5 RBC/hpf   WBC, UA 21-50 0 - 5 WBC/hpf   Bacteria, UA FEW (A) NONE SEEN   Squamous Epithelial / LPF 6-10 0 - 5   Mucus PRESENT    Non Squamous Epithelial 0-5 (A) NONE SEEN  CBC with Differential/Platelet     Status: Abnormal   Collection Time: 10/21/19  4:14 PM  Result Value Ref Range   WBC 4.3 4.0 - 10.5 K/uL   RBC 3.18 (L) 3.87 - 5.11 MIL/uL   Hemoglobin 8.4 (L) 12.0 - 15.0 g/dL   HCT 27.1 (L) 36.0 - 46.0 %   MCV 85.2 80.0 - 100.0 fL   MCH 26.4 26.0 - 34.0 pg   MCHC 31.0 30.0 - 36.0 g/dL   RDW 14.6 11.5 - 15.5 %   Platelets  192 150 - 400 K/uL   nRBC 0.0 0.0 - 0.2 %   Neutrophils Relative % 66 %   Neutro Abs 2.9 1.7 - 7.7 K/uL   Lymphocytes Relative 23 %   Lymphs Abs 1.0 0.7 - 4.0 K/uL   Monocytes Relative 9 %   Monocytes Absolute 0.4 0.1 - 1.0 K/uL   Eosinophils Relative 1 %   Eosinophils Absolute 0.0 0.0 - 0.5 K/uL   Basophils Relative 0 %   Basophils Absolute 0.0 0.0 - 0.1 K/uL   Immature Granulocytes 1 %   Abs Immature Granulocytes 0.02 0.00 - 0.07 K/uL  Comprehensive metabolic panel     Status: Abnormal   Collection Time: 10/21/19  4:14 PM  Result Value Ref Range   Sodium 139 135 - 145 mmol/L   Potassium 3.8 3.5 - 5.1 mmol/L   Chloride 110 98 - 111 mmol/L   CO2 23 22 - 32 mmol/L   Glucose, Bld 70 70 - 99 mg/dL   BUN 12 6 - 20 mg/dL   Creatinine, Ser 0.55 0.44 - 1.00 mg/dL   Calcium 8.7 (L) 8.9 - 10.3 mg/dL   Total Protein 5.8 (L) 6.5 - 8.1 g/dL   Albumin 2.7 (L) 3.5 - 5.0 g/dL   AST 13 (L) 15 - 41 U/L   ALT 11 0 - 44 U/L   Alkaline Phosphatase 36 (L) 38 - 126 U/L   Total Bilirubin 0.4 0.3 - 1.2 mg/dL   GFR calc non Af Amer >60 >60 mL/min   GFR calc Af Amer >60 >60 mL/min   Anion gap 6 5 - 15  Lipase, blood     Status: None   Collection Time: 10/21/19  4:14 PM  Result Value Ref Range   Lipase 24 11 - 51 U/L    IMAGING No results found.  MAU COURSE Orders  Placed This Encounter  Procedures  . Urinalysis, Routine w reflex microscopic  . CBC with Differential/Platelet  . Comprehensive metabolic panel  . Lipase, blood  . Discharge patient   Meds ordered this encounter  Medications  . ondansetron (ZOFRAN-ODT) disintegrating tablet 8 mg  . ondansetron (ZOFRAN ODT) 4 MG disintegrating tablet    Sig: Take 1 tablet (4 mg total) by mouth every 8 (eight) hours as needed for nausea or vomiting.    Dispense:  15 tablet    Refill:  0    Order Specific Question:   Supervising Provider    Answer:   ERVIN, MICHAEL L [[id number]]  . ferrous sulfate (FERROUSUL) 325 (65 FE) MG tablet    Sig: Take 1 tablet (325 mg total) by mouth 2 (two) times daily.    Dispense:  60 tablet    Refill:  1    Order Specific Question:   Supervising Provider    Answer:   Chancy Milroy [[id number]]    MDM Patient concerned about preeclampsia. Normotensive while in MAU & she is less than 20 weeks.  CBC, CMP, lipase collected -- essentially normal except for anemia. Patient may benefit for iron infusions. She has her new ob appointment tomorrow - they may consider setting her up for outpatient infusions. Will rx iron supplements in the mean time.   Zofran given in MAU. Patient reports improvement in nausea & requesting prescription.   FHT present via doppler  No abdominal pain while in MAU  ASSESSMENT 1. Nausea and vomiting during pregnancy prior to [redacted] weeks gestation   2. [redacted] weeks gestation of pregnancy   3.  Anemia complicating pregnancy in second trimester     PLAN Discharge home in stable condition. Rx zofran & ferrous sulfate Discussed reasons to return to MAU Keep OB appointment tomorrow   Allergies as of 10/21/2019   No Known Allergies     Medication List    TAKE these medications   Blood Pressure Kit Devi 1 Device by Does not apply route as needed.   ferrous sulfate 325 (65 FE) MG tablet Commonly known as: FerrouSul Take 1 tablet (325 mg total) by mouth 2  (two) times daily.   ondansetron 4 MG disintegrating tablet Commonly known as: Zofran ODT Take 1 tablet (4 mg total) by mouth every 8 (eight) hours as needed for nausea or vomiting.   PrePLUS 27-1 MG Tabs Take 1 tablet by mouth daily.        Jorje Guild, NP 10/21/2019  5:48 PM

## 2019-10-21 NOTE — BH Specialist Note (Signed)
Integrated Behavioral Health Initial Visit  MRN: [id number] Name: Roshell Brigham  Number of Integrated Behavioral Health Clinician visits:: 1/6 Session Start time: 10:46  Session End time: 10:58 Total time: 12  Type of Service: Integrated Behavioral Health- Individual/Family Interpretor:No. Interpretor Name and Language: n/a   Warm Hand Off Completed.       SUBJECTIVE: Carrie Cardenas is a 29 y.o. female accompanied by n/a Patient was referred by Catalina Antigua, MD for life stress. Patient reports the following symptoms/concerns: Pt states her primary concern today is work and life stress, along with fatigue, poor appetite, and mild depression Duration of problem: Current pregnancy; Severity of problem: moderate  OBJECTIVE: Mood: normal and Affect: Depressed Risk of harm to self or others: No plan to harm self or others  LIFE CONTEXT: Family and Social: Pt has 3yo son who stays with pt's aunt while she works School/Work: Working Community education officer at [employer] Self-Care: - Life Changes: Current pregnancy  GOALS ADDRESSED: Patient will: 1. Reduce symptoms of: stress 2. Increase knowledge and/or ability of: healthy habits  3. Demonstrate ability to: Increase healthy adjustment to current life circumstances and Increase motivation to adhere to plan of care  INTERVENTIONS: Interventions utilized: Solution-Focused Strategies and Psychoeducation and/or Health Education  Standardized Assessments completed: GAD-7 and PHQ 9  ASSESSMENT: Patient currently experiencing Adjustment disorder with depressed mood.   Patient may benefit from psychoeducation and brief therapeutic interventions regarding coping with symptoms of depression and life stress .  PLAN: 1. Follow up with behavioral health clinician on : Three weeks 2. Behavioral recommendations:  -Begin taking both prenatal vitamin and iron as prescribed by medical provider -Consider apps as discussed for self-care  -Prioritize healthy  sleep for remainder of pregnancy; will discuss further at follow up visit 3. Referral(s): Integrated Behavioral Health Services (In Clinic) 4. "From scale of 1-10, how likely are you to follow plan?": -  Rae Lips, LCSW  Depression screen [facility] 2/9 10/22/2019 10/15/2019 08/02/2017 09/28/2016 03/24/2016  Decreased Interest 1 0 1 0 0  Down, Depressed, Hopeless 1 3 1  0 0  PHQ - 2 Score 2 3 2  0 0  Altered sleeping 1 1 0 0 0  Tired, decreased energy 2 2 0 0 0  Change in appetite 3 2 1  0 0  Feeling bad or failure about yourself  1 1 1  0 0  Trouble concentrating 0 0 0 0 0  Moving slowly or fidgety/restless 0 0 0 0 0  Suicidal thoughts 0 0 0 0 0  PHQ-9 Score 9 9 4  0 0   GAD 7 : Generalized Anxiety Score 10/22/2019 10/15/2019 08/02/2017 09/28/2016  Nervous, Anxious, on Edge 0 0 0 0  Control/stop worrying 1 2 0 0  Worry too much - different things 1 2 0 0  Trouble relaxing 0 1 0 0  Restless 0 0 0 0  Easily annoyed or irritable 1 3 0 0  Afraid - awful might happen 1 1 0 0  Total GAD 7 Score 4 9 0 0

## 2019-10-21 NOTE — Discharge Instructions (Signed)
Pregnancy and Anemia ° °Anemia is a condition in which the concentration of red blood cells, or hemoglobin, in the blood is below normal. Hemoglobin is a substance in red blood cells that carries oxygen to the tissues of the body. Anemia results when enough oxygen does not reach these tissues. °Anemia is common during pregnancy because the woman's body needs more blood volume and blood cells to provide nutrition to the fetus. The fetus needs iron and folic acid as it is developing. Your body may not produce enough red blood cells because of this. Also, during pregnancy, the liquid part of the blood (plasma) increases by about 30-50%, and the red blood cells increase by only 20%. This lowers the concentration of the red blood cells and creates a natural anemia-like situation. °What are the causes? °The most common cause of anemia during pregnancy is not having enough iron in the body to make red blood cells (iron deficiency anemia). Other causes may include: °· Folic acid deficiency. °· Vitamin B12 deficiency. °· Certain prescription or over-the-counter medicines. °· Certain medical conditions or infections that destroy red blood cells. °· A low platelet count and bleeding caused by antibodies that go through the placenta to the fetus from the mother’s blood. °What are the signs or symptoms? °Mild anemia may not be noticeable. If it becomes severe, symptoms may include: °· Feeling tired (fatigue). °· Shortness of breath, especially during activity. °· Weakness. °· Fainting. °· Pale looking skin. °· Headaches. °· A fast or irregular heartbeat (palpitations). °· Dizziness. °How is this diagnosed? °This condition may be diagnosed based on: °· Your medical history and a physical exam. °· Blood tests. °How is this treated? °Treatment for anemia during pregnancy depends on the cause of the anemia. Treatment can include: °· Dietary changes. °· Supplements of iron, vitamin B12, or folic acid. °· A blood transfusion. This may  be needed if anemia is severe. °· Hospitalization. This may be needed if there is a lot of blood loss or severe anemia. °Follow these instructions at home: °· Follow recommendations from your dietitian or health care provider about changing your diet. °· Increase your vitamin C intake. This will help the stomach absorb more iron. Some foods that are high in vitamin C include: °? Oranges. °? Peppers. °? Tomatoes. °? Mangoes. °· Eat a diet rich in iron. This would include foods such as: °? Liver. °? Beef. °? Eggs. °? Whole grains. °? Spinach. °? Dried fruit. °· Take iron and vitamins as told by your health care provider. °· Eat green leafy vegetables. These are a good source of folic acid. °· Keep all follow-up visits as told by your health care provider. This is important. °Contact a health care provider if: °· You have frequent or lasting headaches. °· You look pale. °· You bruise easily. °Get help right away if: °· You have extreme weakness, shortness of breath, or chest pain. °· You become dizzy or have trouble concentrating. °· You have heavy vaginal bleeding. °· You develop a rash. °· You have bloody or black, tarry stools. °· You faint. °· You vomit up blood. °· You vomit repeatedly. °· You have abdominal pain. °· You have a fever. °· You are dehydrated. °Summary °· Anemia is a condition in which the concentration of red blood cells or hemoglobin in the blood is below normal. °· Anemia is common during pregnancy because the woman's body needs more blood volume and blood cells to provide nutrition to the fetus. °· The most   common cause of anemia during pregnancy is not having enough iron in the body to make red blood cells (iron deficiency anemia). °· Mild anemia may not be noticeable. If it becomes severe, symptoms may include feeling tired and weak. °This information is not intended to replace advice given to you by your health care provider. Make sure you discuss any questions you have with your health care  provider. °Document Revised: 11/20/2018 Document Reviewed: 07/12/2016 °Elsevier Patient Education © 2020 [employer] ° °

## 2019-10-21 NOTE — Progress Notes (Signed)
Pt states she feels "a little bit better" after the Zofran.

## 2019-10-21 NOTE — MAU Note (Signed)
Pt states she started to not feel very well upon her arrival to work yesterday, states she was nauseated and had abdominal pain that was all over her middle abdomen that was constant. States she tried to work through it but left work about 30 minutes early. States she vomited twice yesterday but none today but remains nauseated with the abdominal pain rating it a 5 or 6/10. Denies urinary symptoms, denies vaginal itching/burning/discharge. Denies LOF/VB.

## 2019-10-22 ENCOUNTER — Encounter: Payer: Self-pay | Admitting: Obstetrics and Gynecology

## 2019-10-22 ENCOUNTER — Ambulatory Visit (INDEPENDENT_AMBULATORY_CARE_PROVIDER_SITE_OTHER): Payer: Medicaid Other | Admitting: Obstetrics and Gynecology

## 2019-10-22 ENCOUNTER — Ambulatory Visit (INDEPENDENT_AMBULATORY_CARE_PROVIDER_SITE_OTHER): Payer: Medicaid Other | Admitting: Clinical

## 2019-10-22 ENCOUNTER — Other Ambulatory Visit (HOSPITAL_COMMUNITY)
Admission: RE | Admit: 2019-10-22 | Discharge: 2019-10-22 | Disposition: A | Discharge status: Home / Self Care | Payer: Medicaid Other | Source: Ambulatory Visit | Attending: Obstetrics and Gynecology | Admitting: Obstetrics and Gynecology

## 2019-10-22 DIAGNOSIS — O09292 Supervision of pregnancy with other poor reproductive or obstetric history, second trimester: Secondary | ICD-10-CM

## 2019-10-22 DIAGNOSIS — O09299 Supervision of pregnancy with other poor reproductive or obstetric history, unspecified trimester: Secondary | ICD-10-CM

## 2019-10-22 DIAGNOSIS — F4321 Adjustment disorder with depressed mood: Secondary | ICD-10-CM

## 2019-10-22 DIAGNOSIS — O0990 Supervision of high risk pregnancy, unspecified, unspecified trimester: Secondary | ICD-10-CM | POA: Insufficient documentation

## 2019-10-22 DIAGNOSIS — Z8619 Personal history of other infectious and parasitic diseases: Secondary | ICD-10-CM

## 2019-10-22 DIAGNOSIS — E669 Obesity, unspecified: Secondary | ICD-10-CM

## 2019-10-22 DIAGNOSIS — O99210 Obesity complicating pregnancy, unspecified trimester: Secondary | ICD-10-CM

## 2019-10-22 DIAGNOSIS — Z3A19 19 weeks gestation of pregnancy: Secondary | ICD-10-CM

## 2019-10-22 LAB — POCT URINALYSIS DIP (DEVICE)
Bilirubin Urine: NEGATIVE
Glucose, UA: NEGATIVE mg/dL
Hgb urine dipstick: NEGATIVE
Ketones, ur: NEGATIVE mg/dL
Nitrite: NEGATIVE
Protein, ur: NEGATIVE mg/dL
Specific Gravity, Urine: 1.025 (ref 1.005–1.030)
Urobilinogen, UA: 0.2 mg/dL (ref 0.0–1.0)
pH: 7 (ref 5.0–8.0)

## 2019-10-22 MED ORDER — ASPIRIN EC 81 MG PO TBEC: 81.0000 mg | DELAYED_RELEASE_TABLET | Freq: Every day | ORAL | 2 refills | Status: DC

## 2019-10-22 MED ORDER — VITAFOL GUMMIES 3.33-0.333-34.8 MG PO CHEW: 2.0000 | CHEWABLE_TABLET | Freq: Every day | ORAL | 6 refills | Status: AC

## 2019-10-22 NOTE — Progress Notes (Addendum)
BP cuff from [facility] given to patient and education provided on how to check BP & when. Return demonstration provided. Discussed logging in [employer]

## 2019-10-22 NOTE — Patient Instructions (Signed)

## 2019-10-22 NOTE — Progress Notes (Signed)
Subjective:    Carrie Cardenas is a G2P1001 [redacted]w[redacted]d being seen today for her first obstetrical visit.  Her obstetrical history is significant for obesity, pre-eclampsia and hisotry of PPH due to retaine POC. Patient does intend to breast feed. Pregnancy history fully reviewed.  Patient reports no complaints.  Vitals:   10/22/19 0955  BP: 112/72  Pulse: 71  Weight: 272 lb 1.6 oz (123.4 kg)    HISTORY: OB History  Gravida Para Term Preterm AB Living  2 1 1  0 0 1  SAB TAB Ectopic Multiple Live Births  0 0 0 0 1    # Outcome Date GA Lbr Len/2nd Weight Sex Delivery Anes PTL Lv  2 Current           1 Term 04/01/16 [redacted]w[redacted]d 05:35 / 00:48 6 lb 11.1 oz (3.035 kg) M Vag-Spont EPI  LIV     Birth Comments: preeclampsia, postpartum hemorrhage and retained poducts of conception, sepsis   Past Medical History:  Diagnosis Date  . Depression    pt hospitalized at 29 years old for major depression  . Herpes genitalis in women   . Hypovolemic shock ([place])   . Mallory-Weiss tear 04/07/2016  . Retained products of conception, following delivery with hemorrhage 04/05/2016  . Sepsis ([place])   . Severe preeclampsia, third trimester 03/30/2016   Past Surgical History:  Procedure Laterality Date  . DILATION AND EVACUATION N/A 04/05/2016   Procedure: DILATATION AND EVACUATION;  Surgeon: Osborne Oman, MD;  Location: [facility];  Service: Gynecology;  Laterality: N/A;  . ESOPHAGOGASTRODUODENOSCOPY N/A 04/07/2016   Procedure: ESOPHAGOGASTRODUODENOSCOPY (EGD);  Surgeon: Wilford Corner, MD;  Location: [facility];  Service: Endoscopy;  Laterality: N/A;  . ESOPHAGOGASTRODUODENOSCOPY N/A 04/07/2016   Procedure: ESOPHAGOGASTRODUODENOSCOPY (EGD);  Surgeon: Wilford Corner, MD;  Location: [facility];  Service: Endoscopy;  Laterality: N/A;  . ESOPHAGOGASTRODUODENOSCOPY N/A 04/08/2016   Procedure: ESOPHAGOGASTRODUODENOSCOPY (EGD);  Surgeon: Alphonsa Overall, MD;  Location: [facility];  Service: General;  Laterality:  N/A;  . GASTRIC BYPASS    . SLEEVE GASTROPLASTY  2015   Family History  Problem Relation Age of Onset  . Fibroids Mother   . Anemia Mother   . Pulmonary embolism Mother   . Cancer Maternal Grandmother      Exam    Uterus:   20-weeks  Pelvic Exam:    Perineum: Normal Perineum   Vulva: normal   Vagina:  normal mucosa, normal discharge   pH:    Cervix: multiparous appearance and cervix is closed and long   Adnexa: not evaluated   Bony Pelvis: gynecoid  System: Breast:  normal appearance, no masses or tenderness   Skin: normal coloration and turgor, no rashes    Neurologic: oriented, no focal deficits   Extremities: normal strength, tone, and muscle mass   HEENT extra ocular movement intact   Mouth/Teeth mucous membranes moist, pharynx normal without lesions and dental hygiene good   Neck supple and no masses   Cardiovascular: regular rate and rhythm   Respiratory:  appears well, vitals normal, no respiratory distress, acyanotic, normal RR, chest clear, no wheezing, crepitations, rhonchi, normal symmetric air entry   Abdomen: soft, non-tender; bowel sounds normal; no masses,  no organomegaly   Urinary:       Assessment:    Pregnancy: G2P1001 Patient Active Problem List   Diagnosis Date Noted  . Supervision of high risk pregnancy, antepartum 10/15/2019  . History of pre-eclampsia in prior pregnancy, currently pregnant 10/15/2019  . History  of postpartum hemorrhage, currently pregnant 10/15/2019  . History of herpes genitalis   . Obesity affecting pregnancy   . History of gastric bypass   . Acute hypoxemic respiratory failure (HCC)   . LGSIL (low grade squamous intraepithelial dysplasia) 01/19/2016  . Genital warts complicating pregnancy 12/16/2015        Plan:     Initial labs drawn. Prenatal vitamins. Problem list reviewed and updated. Genetic Screening discussed : panorama and AFP collected.  Ultrasound discussed; fetal survey: ordered.  Follow up in 4  weeks. 50% of 30 min visit spent on counseling and coordination of care.     Tamim Skog 10/22/2019

## 2019-10-22 NOTE — Patient Instructions (Signed)
 Second Trimester of Pregnancy The second trimester is from week 14 through week 27 (months 4 through 6). The second trimester is often a time when you feel your best. Your body has adjusted to being pregnant, and you begin to feel better physically. Usually, morning sickness has lessened or quit completely, you may have more energy, and you may have an increase in appetite. The second trimester is also a time when the fetus is growing rapidly. At the end of the sixth month, the fetus is about 9 inches long and weighs about 1 pounds. You will likely begin to feel the baby move (quickening) between 16 and 20 weeks of pregnancy. Body changes during your second trimester Your body continues to go through many changes during your second trimester. The changes vary from woman to woman.  Your weight will continue to increase. You will notice your lower abdomen bulging out.  You may begin to get stretch marks on your hips, abdomen, and breasts.  You may develop headaches that can be relieved by medicines. The medicines should be approved by your health care provider.  You may urinate more often because the fetus is pressing on your bladder.  You may develop or continue to have heartburn as a result of your pregnancy.  You may develop constipation because certain hormones are causing the muscles that push waste through your intestines to slow down.  You may develop hemorrhoids or swollen, bulging veins (varicose veins).  You may have back pain. This is caused by: ? Weight gain. ? Pregnancy hormones that are relaxing the joints in your pelvis. ? A shift in weight and the muscles that support your balance.  Your breasts will continue to grow and they will continue to become tender.  Your gums may bleed and may be sensitive to brushing and flossing.  Dark spots or blotches (chloasma, mask of pregnancy) may develop on your face. This will likely fade after the baby is born.  A dark line from  your belly button to the pubic area (linea nigra) may appear. This will likely fade after the baby is born.  You may have changes in your hair. These can include thickening of your hair, rapid growth, and changes in texture. Some women also have hair loss during or after pregnancy, or hair that feels dry or thin. Your hair will most likely return to normal after your baby is born. What to expect at prenatal visits During a routine prenatal visit:  You will be weighed to make sure you and the fetus are growing normally.  Your blood pressure will be taken.  Your abdomen will be measured to track your baby's growth.  The fetal heartbeat will be listened to.  Any test results from the previous visit will be discussed. Your health care provider may ask you:  How you are feeling.  If you are feeling the baby move.  If you have had any abnormal symptoms, such as leaking fluid, bleeding, severe headaches, or abdominal cramping.  If you are using any tobacco products, including cigarettes, chewing tobacco, and electronic cigarettes.  If you have any questions. Other tests that may be performed during your second trimester include:  Blood tests that check for: ? Low iron levels (anemia). ? High blood sugar that affects pregnant women (gestational diabetes) between 24 and 28 weeks. ? Rh antibodies. This is to check for a protein on red blood cells (Rh factor).  Urine tests to check for infections, diabetes, or protein in   the urine.  An ultrasound to confirm the proper growth and development of the baby.  An amniocentesis to check for possible genetic problems.  Fetal screens for spina bifida and Down syndrome.  HIV (human immunodeficiency virus) testing. Routine prenatal testing includes screening for HIV, unless you choose not to have this test. Follow these instructions at home: Medicines  Follow your health care provider's instructions regarding medicine use. Specific medicines  may be either safe or unsafe to take during pregnancy.  Take a prenatal vitamin that contains at least 600 micrograms (mcg) of folic acid.  If you develop constipation, try taking a stool softener if your health care provider approves. Eating and drinking   Eat a balanced diet that includes fresh fruits and vegetables, whole grains, good sources of protein such as meat, eggs, or tofu, and low-fat dairy. Your health care provider will help you determine the amount of weight gain that is right for you.  Avoid raw meat and uncooked cheese. These carry germs that can cause birth defects in the baby.  If you have low calcium intake from food, talk to your health care provider about whether you should take a daily calcium supplement.  Limit foods that are high in fat and processed sugars, such as fried and sweet foods.  To prevent constipation: ? Drink enough fluid to keep your urine clear or pale yellow. ? Eat foods that are high in fiber, such as fresh fruits and vegetables, whole grains, and beans. Activity  Exercise only as directed by your health care provider. Most women can continue their usual exercise routine during pregnancy. Try to exercise for 30 minutes at least 5 days a week. Stop exercising if you experience uterine contractions.  Avoid heavy lifting, wear low heel shoes, and practice good posture.  A sexual relationship may be continued unless your health care provider directs you otherwise. Relieving pain and discomfort  Wear a good support bra to prevent discomfort from breast tenderness.  Take warm sitz baths to soothe any pain or discomfort caused by hemorrhoids. Use hemorrhoid cream if your health care provider approves.  Rest with your legs elevated if you have leg cramps or low back pain.  If you develop varicose veins, wear support hose. Elevate your feet for 15 minutes, 3-4 times a day. Limit salt in your diet. Prenatal Care  Write down your questions. Take  them to your prenatal visits.  Keep all your prenatal visits as told by your health care provider. This is important. Safety  Wear your seat belt at all times when driving.  Make a list of emergency phone numbers, including numbers for family, friends, the hospital, and police and fire departments. General instructions  Ask your health care provider for a referral to a local prenatal education class. Begin classes no later than the beginning of month 6 of your pregnancy.  Ask for help if you have counseling or nutritional needs during pregnancy. Your health care provider can offer advice or refer you to specialists for help with various needs.  Do not use hot tubs, steam rooms, or saunas.  Do not douche or use tampons or scented sanitary pads.  Do not cross your legs for long periods of time.  Avoid cat litter boxes and soil used by cats. These carry germs that can cause birth defects in the baby and possibly loss of the fetus by miscarriage or stillbirth.  Avoid all smoking, herbs, alcohol, and unprescribed drugs. Chemicals in these products can affect the   formation and growth of the baby.  Do not use any products that contain nicotine or tobacco, such as cigarettes and e-cigarettes. If you need help quitting, ask your health care provider.  Visit your dentist if you have not gone yet during your pregnancy. Use a soft toothbrush to brush your teeth and be gentle when you floss. Contact a health care provider if:  You have dizziness.  You have mild pelvic cramps, pelvic pressure, or nagging pain in the abdominal area.  You have persistent nausea, vomiting, or diarrhea.  You have a bad smelling vaginal discharge.  You have pain when you urinate. Get help right away if:  You have a fever.  You are leaking fluid from your vagina.  You have spotting or bleeding from your vagina.  You have severe abdominal cramping or pain.  You have rapid weight gain or weight loss.  You  have shortness of breath with chest pain.  You notice sudden or extreme swelling of your face, hands, ankles, feet, or legs.  You have not felt your baby move in over an hour.  You have severe headaches that do not go away when you take medicine.  You have vision changes. Summary  The second trimester is from week 14 through week 27 (months 4 through 6). It is also a time when the fetus is growing rapidly.  Your body goes through many changes during pregnancy. The changes vary from woman to woman.  Avoid all smoking, herbs, alcohol, and unprescribed drugs. These chemicals affect the formation and growth your baby.  Do not use any tobacco products, such as cigarettes, chewing tobacco, and e-cigarettes. If you need help quitting, ask your health care provider.  Contact your health care provider if you have any questions. Keep all prenatal visits as told by your health care provider. This is important. This information is not intended to replace advice given to you by your health care provider. Make sure you discuss any questions you have with your health care provider. Document Revised: 09/28/2018 Document Reviewed: 07/12/2016 [employer] Patient Education  2020 [employer]   Contraception Choices Contraception, also called birth control, refers to methods or devices that prevent pregnancy. Hormonal methods Contraceptive implant  A contraceptive implant is a thin, plastic tube that contains a hormone. It is inserted into the upper part of the arm. It can remain in place for up to 3 years. Progestin-only injections Progestin-only injections are injections of progestin, a synthetic form of the hormone progesterone. They are given every 3 months by a health care provider. Birth control pills  Birth control pills are pills that contain hormones that prevent pregnancy. They must be taken once a day, preferably at the same time each day. Birth control patch  The birth control patch  contains hormones that prevent pregnancy. It is placed on the skin and must be changed once a week for three weeks and removed on the fourth week. A prescription is needed to use this method of contraception. Vaginal ring  A vaginal ring contains hormones that prevent pregnancy. It is placed in the vagina for three weeks and removed on the fourth week. After that, the process is repeated with a new ring. A prescription is needed to use this method of contraception. Emergency contraceptive Emergency contraceptives prevent pregnancy after unprotected sex. They come in pill form and can be taken up to 5 days after sex. They work best the sooner they are taken after having sex. Most emergency contraceptives are available   without a prescription. This method should not be used as your only form of birth control. Barrier methods Female condom  A female condom is a thin sheath that is worn over the penis during sex. Condoms keep sperm from going inside a woman's body. They can be used with a spermicide to increase their effectiveness. They should be disposed after a single use. Female condom  A female condom is a soft, loose-fitting sheath that is put into the vagina before sex. The condom keeps sperm from going inside a woman's body. They should be disposed after a single use. Diaphragm  A diaphragm is a soft, dome-shaped barrier. It is inserted into the vagina before sex, along with a spermicide. The diaphragm blocks sperm from entering the uterus, and the spermicide kills sperm. A diaphragm should be left in the vagina for 6-8 hours after sex and removed within 24 hours. A diaphragm is prescribed and fitted by a health care provider. A diaphragm should be replaced every 1-2 years, after giving birth, after gaining more than 15 lb (6.8 kg), and after pelvic surgery. Cervical cap  A cervical cap is a round, soft latex or plastic cup that fits over the cervix. It is inserted into the vagina before sex, along  with spermicide. It blocks sperm from entering the uterus. The cap should be left in place for 6-8 hours after sex and removed within 48 hours. A cervical cap must be prescribed and fitted by a health care provider. It should be replaced every 2 years. Sponge  A sponge is a soft, circular piece of polyurethane foam with spermicide on it. The sponge helps block sperm from entering the uterus, and the spermicide kills sperm. To use it, you make it wet and then insert it into the vagina. It should be inserted before sex, left in for at least 6 hours after sex, and removed and thrown away within 30 hours. Spermicides Spermicides are chemicals that kill or block sperm from entering the cervix and uterus. They can come as a cream, jelly, suppository, foam, or tablet. A spermicide should be inserted into the vagina with an applicator at least 10-15 minutes before sex to allow time for it to work. The process must be repeated every time you have sex. Spermicides do not require a prescription. Intrauterine contraception Intrauterine device (IUD) An IUD is a T-shaped device that is put in a woman's uterus. There are two types:  Hormone IUD.This type contains progestin, a synthetic form of the hormone progesterone. This type can stay in place for 3-5 years.  Copper IUD.This type is wrapped in copper wire. It can stay in place for 10 years.  Permanent methods of contraception Female tubal ligation In this method, a woman's fallopian tubes are sealed, tied, or blocked during surgery to prevent eggs from traveling to the uterus. Hysteroscopic sterilization In this method, a small, flexible insert is placed into each fallopian tube. The inserts cause scar tissue to form in the fallopian tubes and block them, so sperm cannot reach an egg. The procedure takes about 3 months to be effective. Another form of birth control must be used during those 3 months. Female sterilization This is a procedure to tie off the  tubes that carry sperm (vasectomy). After the procedure, the man can still ejaculate fluid (semen). Natural planning methods Natural family planning In this method, a couple does not have sex on days when the woman could become pregnant. Calendar method This means keeping track of the length   of each menstrual cycle, identifying the days when pregnancy can happen, and not having sex on those days. Ovulation method In this method, a couple avoids sex during ovulation. Symptothermal method This method involves not having sex during ovulation. The woman typically checks for ovulation by watching changes in her temperature and in the consistency of cervical mucus. Post-ovulation method In this method, a couple waits to have sex until after ovulation. Summary  Contraception, also called birth control, means methods or devices that prevent pregnancy.  Hormonal methods of contraception include implants, injections, pills, patches, vaginal rings, and emergency contraceptives.  Barrier methods of contraception can include female condoms, female condoms, diaphragms, cervical caps, sponges, and spermicides.  There are two types of IUDs (intrauterine devices). An IUD can be put in a woman's uterus to prevent pregnancy for 3-5 years.  Permanent sterilization can be done through a procedure for males, females, or both.  Natural family planning methods involve not having sex on days when the woman could become pregnant. This information is not intended to replace advice given to you by your health care provider. Make sure you discuss any questions you have with your health care provider. Document Revised: 06/08/2017 Document Reviewed: 07/09/2016 Else[employer] Patient Education  2020 [employer]   Breastfeeding  Choosing to breastfeed is one of the best decisions you can make for yourself and your baby. A change in hormones during pregnancy causes your breasts to make breast milk in your milk-producing  glands. Hormones prevent breast milk from being released before your baby is born. They also prompt milk flow after birth. Once breastfeeding has begun, thoughts of your baby, as well as his or her sucking or crying, can stimulate the release of milk from your milk-producing glands. Benefits of breastfeeding Research shows that breastfeeding offers many health benefits for infants and mothers. It also offers a cost-free and convenient way to feed your baby. For your baby  Your first milk (colostrum) helps your baby's digestive system to function better.  Special cells in your milk (antibodies) help your baby to fight off infections.  Breastfed babies are less likely to develop asthma, allergies, obesity, or type 2 diabetes. They are also at lower risk for sudden infant death syndrome (SIDS).  Nutrients in breast milk are better able to meet your baby's needs compared to infant formula.  Breast milk improves your baby's brain development. For you  Breastfeeding helps to create a very special bond between you and your baby.  Breastfeeding is convenient. Breast milk costs nothing and is always available at the correct temperature.  Breastfeeding helps to burn calories. It helps you to lose the weight that you gained during pregnancy.  Breastfeeding makes your uterus return faster to its size before pregnancy. It also slows bleeding (lochia) after you give birth.  Breastfeeding helps to lower your risk of developing type 2 diabetes, osteoporosis, rheumatoid arthritis, cardiovascular disease, and breast, ovarian, uterine, and endometrial cancer later in life. Breastfeeding basics Starting breastfeeding  Find a comfortable place to sit or lie down, with your neck and back well-supported.  Place a pillow or a rolled-up blanket under your baby to bring him or her to the level of your breast (if you are seated). Nursing pillows are specially designed to help support your arms and your baby while  you breastfeed.  Make sure that your baby's tummy (abdomen) is facing your abdomen.  Gently massage your breast. With your fingertips, massage from the outer edges of your breast inward toward   the nipple. This encourages milk flow. If your milk flows slowly, you may need to continue this action during the feeding.  Support your breast with 4 fingers underneath and your thumb above your nipple (make the letter "C" with your hand). Make sure your fingers are well away from your nipple and your baby's mouth.  Stroke your baby's lips gently with your finger or nipple.  When your baby's mouth is open wide enough, quickly bring your baby to your breast, placing your entire nipple and as much of the areola as possible into your baby's mouth. The areola is the colored area around your nipple. ? More areola should be visible above your baby's upper lip than below the lower lip. ? Your baby's lips should be opened and extended outward (flanged) to ensure an adequate, comfortable latch. ? Your baby's tongue should be between his or her lower gum and your breast.  Make sure that your baby's mouth is correctly positioned around your nipple (latched). Your baby's lips should create a seal on your breast and be turned out (everted).  It is common for your baby to suck about 2-3 minutes in order to start the flow of breast milk. Latching Teaching your baby how to latch onto your breast properly is very important. An improper latch can cause nipple pain, decreased milk supply, and poor weight gain in your baby. Also, if your baby is not latched onto your nipple properly, he or she may swallow some air during feeding. This can make your baby fussy. Burping your baby when you switch breasts during the feeding can help to get rid of the air. However, teaching your baby to latch on properly is still the best way to prevent fussiness from swallowing air while breastfeeding. Signs that your baby has successfully  latched onto your nipple  Silent tugging or silent sucking, without causing you pain. Infant's lips should be extended outward (flanged).  Swallowing heard between every 3-4 sucks once your milk has started to flow (after your let-down milk reflex occurs).  Muscle movement above and in front of his or her ears while sucking. Signs that your baby has not successfully latched onto your nipple  Sucking sounds or smacking sounds from your baby while breastfeeding.  Nipple pain. If you think your baby has not latched on correctly, slip your finger into the corner of your baby's mouth to break the suction and place it between your baby's gums. Attempt to start breastfeeding again. Signs of successful breastfeeding Signs from your baby  Your baby will gradually decrease the number of sucks or will completely stop sucking.  Your baby will fall asleep.  Your baby's body will relax.  Your baby will retain a small amount of milk in his or her mouth.  Your baby will let go of your breast by himself or herself. Signs from you  Breasts that have increased in firmness, weight, and size 1-3 hours after feeding.  Breasts that are softer immediately after breastfeeding.  Increased milk volume, as well as a change in milk consistency and color by the fifth day of breastfeeding.  Nipples that are not sore, cracked, or bleeding. Signs that your baby is getting enough milk  Wetting at least 1-2 diapers during the first 24 hours after birth.  Wetting at least 5-6 diapers every 24 hours for the first week after birth. The urine should be clear or pale yellow by the age of 5 days.  Wetting 6-8 diapers every 24 hours as   your baby continues to grow and develop.  At least 3 stools in a 24-hour period by the age of 5 days. The stool should be soft and yellow.  At least 3 stools in a 24-hour period by the age of 7 days. The stool should be seedy and yellow.  No loss of weight greater than 10% of  birth weight during the first 3 days of life.  Average weight gain of 4-7 oz (113-198 g) per week after the age of 4 days.  Consistent daily weight gain by the age of 5 days, without weight loss after the age of 2 weeks. After a feeding, your baby may spit up a small amount of milk. This is normal. Breastfeeding frequency and duration Frequent feeding will help you make more milk and can prevent sore nipples and extremely full breasts (breast engorgement). Breastfeed when you feel the need to reduce the fullness of your breasts or when your baby shows signs of hunger. This is called "breastfeeding on demand." Signs that your baby is hungry include:  Increased alertness, activity, or restlessness.  Movement of the head from side to side.  Opening of the mouth when the corner of the mouth or cheek is stroked (rooting).  Increased sucking sounds, smacking lips, cooing, sighing, or squeaking.  Hand-to-mouth movements and sucking on fingers or hands.  Fussing or crying. Avoid introducing a pacifier to your baby in the first 4-6 weeks after your baby is born. After this time, you may choose to use a pacifier. Research has shown that pacifier use during the first year of a baby's life decreases the risk of sudden infant death syndrome (SIDS). Allow your baby to feed on each breast as long as he or she wants. When your baby unlatches or falls asleep while feeding from the first breast, offer the second breast. Because newborns are often sleepy in the first few weeks of life, you may need to awaken your baby to get him or her to feed. Breastfeeding times will vary from baby to baby. However, the following rules can serve as a guide to help you make sure that your baby is properly fed:  Newborns (babies 4 weeks of age or younger) may breastfeed every 1-3 hours.  Newborns should not go without breastfeeding for longer than 3 hours during the day or 5 hours during the night.  You should breastfeed  your baby a minimum of 8 times in a 24-hour period. Breast milk pumping     Pumping and storing breast milk allows you to make sure that your baby is exclusively fed your breast milk, even at times when you are unable to breastfeed. This is especially important if you go back to work while you are still breastfeeding, or if you are not able to be present during feedings. Your lactation consultant can help you find a method of pumping that works best for you and give you guidelines about how long it is safe to store breast milk. Caring for your breasts while you breastfeed Nipples can become dry, cracked, and sore while breastfeeding. The following recommendations can help keep your breasts moisturized and healthy:  Avoid using soap on your nipples.  Wear a supportive bra designed especially for nursing. Avoid wearing underwire-style bras or extremely tight bras (sports bras).  Air-dry your nipples for 3-4 minutes after each feeding.  Use only cotton bra pads to absorb leaked breast milk. Leaking of breast milk between feedings is normal.  Use lanolin on your nipples   after breastfeeding. Lanolin helps to maintain your skin's normal moisture barrier. Pure lanolin is not harmful (not toxic) to your baby. You may also hand express a few drops of breast milk and gently massage that milk into your nipples and allow the milk to air-dry. In the first few weeks after giving birth, some women experience breast engorgement. Engorgement can make your breasts feel heavy, warm, and tender to the touch. Engorgement peaks within 3-5 days after you give birth. The following recommendations can help to ease engorgement:  Completely empty your breasts while breastfeeding or pumping. You may want to start by applying warm, moist heat (in the shower or with warm, water-soaked hand towels) just before feeding or pumping. This increases circulation and helps the milk flow. If your baby does not completely empty your  breasts while breastfeeding, pump any extra milk after he or she is finished.  Apply ice packs to your breasts immediately after breastfeeding or pumping, unless this is too uncomfortable for you. To do this: ? Put ice in a plastic bag. ? Place a towel between your skin and the bag. ? Leave the ice on for 20 minutes, 2-3 times a day.  Make sure that your baby is latched on and positioned properly while breastfeeding. If engorgement persists after 48 hours of following these recommendations, contact your health care provider or a lactation consultant. Overall health care recommendations while breastfeeding  Eat 3 healthy meals and 3 snacks every day. Well-nourished mothers who are breastfeeding need an additional 450-500 calories a day. You can meet this requirement by increasing the amount of a balanced diet that you eat.  Drink enough water to keep your urine pale yellow or clear.  Rest often, relax, and continue to take your prenatal vitamins to prevent fatigue, stress, and low vitamin and mineral levels in your body (nutrient deficiencies).  Do not use any products that contain nicotine or tobacco, such as cigarettes and e-cigarettes. Your baby may be harmed by chemicals from cigarettes that pass into breast milk and exposure to secondhand smoke. If you need help quitting, ask your health care provider.  Avoid alcohol.  Do not use illegal drugs or marijuana.  Talk with your health care provider before taking any medicines. These include over-the-counter and prescription medicines as well as vitamins and herbal supplements. Some medicines that may be harmful to your baby can pass through breast milk.  It is possible to become pregnant while breastfeeding. If birth control is desired, ask your health care provider about options that will be safe while breastfeeding your baby. Where to find more information: [employer]: www.llli.org Contact a health care provider  if:  You feel like you want to stop breastfeeding or have become frustrated with breastfeeding.  Your nipples are cracked or bleeding.  Your breasts are red, tender, or warm.  You have: ? Painful breasts or nipples. ? A swollen area on either breast. ? A fever or chills. ? Nausea or vomiting. ? Drainage other than breast milk from your nipples.  Your breasts do not become full before feedings by the fifth day after you give birth.  You feel sad and depressed.  Your baby is: ? Too sleepy to eat well. ? Having trouble sleeping. ? More than 1 week old and wetting fewer than 6 diapers in a 24-hour period. ? Not gaining weight by 5 days of age.  Your baby has fewer than 3 stools in a 24-hour period.  Your baby's skin or   the white parts of his or her eyes become yellow. Get help right away if:  Your baby is overly tired (lethargic) and does not want to wake up and feed.  Your baby develops an unexplained fever. Summary  Breastfeeding offers many health benefits for infant and mothers.  Try to breastfeed your infant when he or she shows early signs of hunger.  Gently tickle or stroke your baby's lips with your finger or nipple to allow the baby to open his or her mouth. Bring the baby to your breast. Make sure that much of the areola is in your baby's mouth. Offer one side and burp the baby before you offer the other side.  Talk with your health care provider or lactation consultant if you have questions or you face problems as you breastfeed. This information is not intended to replace advice given to you by your health care provider. Make sure you discuss any questions you have with your health care provider. Document Revised: 08/31/2017 Document Reviewed: 07/08/2016 Elsevier Patient Education  2020 Elsevier Inc.  

## 2019-10-22 NOTE — Addendum Note (Signed)
Addended by: Faythe Casa on: 10/22/2019 11:40 AM   Modules accepted: Orders

## 2019-10-23 ENCOUNTER — Encounter: Payer: Self-pay | Admitting: Obstetrics and Gynecology

## 2019-10-23 DIAGNOSIS — O98812 Other maternal infectious and parasitic diseases complicating pregnancy, second trimester: Secondary | ICD-10-CM | POA: Insufficient documentation

## 2019-10-23 DIAGNOSIS — A749 Chlamydial infection, unspecified: Secondary | ICD-10-CM | POA: Insufficient documentation

## 2019-10-23 LAB — CYTOLOGY - PAP: Diagnosis: NEGATIVE

## 2019-10-23 LAB — CERVICOVAGINAL ANCILLARY ONLY
Chlamydia: POSITIVE — AB
Comment: NEGATIVE
Comment: NORMAL
Neisseria Gonorrhea: NEGATIVE

## 2019-10-23 LAB — PROTEIN / CREATININE RATIO, URINE
Creatinine, Urine: 136.9 mg/dL
Protein, Ur: 25.7 mg/dL
Protein/Creat Ratio: 188 mg/g creat (ref 0–200)

## 2019-10-23 MED ORDER — AZITHROMYCIN 500 MG PO TABS: 1000.0000 mg | ORAL_TABLET | Freq: Once | ORAL | 1 refills | Status: AC

## 2019-10-23 NOTE — Addendum Note (Signed)
Addended by: Catalina Antigua on: 10/23/2019 02:18 PM   Modules accepted: Orders

## 2019-10-24 ENCOUNTER — Telehealth: Payer: Self-pay | Admitting: *Deleted

## 2019-10-24 LAB — AFP, SERUM, OPEN SPINA BIFIDA
AFP MoM: 1.07
AFP Value: 42.3 ng/mL
Gest. Age on Collection Date: 19 weeks
Maternal Age At EDD: 28.2 yr
OSBR Risk 1 IN: 10000
Test Results:: NEGATIVE
Weight: 272 [lb_av]

## 2019-10-24 LAB — OBSTETRIC PANEL, INCLUDING HIV
Antibody Screen: NEGATIVE
Basophils Absolute: 0 10*3/uL (ref 0.0–0.2)
Basos: 1 %
EOS (ABSOLUTE): 0 10*3/uL (ref 0.0–0.4)
Eos: 0 %
HIV Screen 4th Generation wRfx: NONREACTIVE
Hematocrit: 26.2 % — ABNORMAL LOW (ref 34.0–46.6)
Hemoglobin: 8.3 g/dL — ABNORMAL LOW (ref 11.1–15.9)
Hepatitis B Surface Ag: NEGATIVE
Immature Grans (Abs): 0 10*3/uL (ref 0.0–0.1)
Immature Granulocytes: 0 %
Lymphocytes Absolute: 1.1 10*3/uL (ref 0.7–3.1)
Lymphs: 28 %
MCH: 26.6 pg (ref 26.6–33.0)
MCHC: 31.7 g/dL (ref 31.5–35.7)
MCV: 84 fL (ref 79–97)
Monocytes Absolute: 0.4 10*3/uL (ref 0.1–0.9)
Monocytes: 9 %
Neutrophils Absolute: 2.4 10*3/uL (ref 1.4–7.0)
Neutrophils: 62 %
Platelets: 214 10*3/uL (ref 150–450)
RBC: 3.12 x10E6/uL — ABNORMAL LOW (ref 3.77–5.28)
RDW: 14.2 % (ref 11.7–15.4)
RPR Ser Ql: NONREACTIVE
Rh Factor: POSITIVE
Rubella Antibodies, IGG: 4.44 index (ref >0.99)
WBC: 3.9 10*3/uL (ref 3.4–10.8)

## 2019-10-24 LAB — COMPREHENSIVE METABOLIC PANEL
ALT: 8 IU/L (ref 0–32)
AST: 8 IU/L (ref 0–40)
Albumin/Globulin Ratio: 1.6 (ref 1.2–2.2)
Albumin: 3.7 g/dL — ABNORMAL LOW (ref 3.9–5.0)
Alkaline Phosphatase: 48 IU/L (ref 39–117)
BUN/Creatinine Ratio: 23 (ref 9–23)
BUN: 11 mg/dL (ref 6–20)
Bilirubin Total: 0.2 mg/dL (ref 0.0–1.2)
CO2: 18 mmol/L — ABNORMAL LOW (ref 20–29)
Calcium: 8.8 mg/dL (ref 8.7–10.2)
Chloride: 107 mmol/L — ABNORMAL HIGH (ref 96–106)
Creatinine, Ser: 0.47 mg/dL — ABNORMAL LOW (ref 0.57–1.00)
GFR calc Af Amer: 157 mL/min/{1.73_m2} (ref >59)
GFR calc non Af Amer: 136 mL/min/{1.73_m2} (ref >59)
Globulin, Total: 2.3 g/dL (ref 1.5–4.5)
Glucose: 77 mg/dL (ref 65–99)
Potassium: 4.2 mmol/L (ref 3.5–5.2)
Sodium: 136 mmol/L (ref 134–144)
Total Protein: 6 g/dL (ref 6.0–8.5)

## 2019-10-24 LAB — HEMOGLOBIN A1C
Est. average glucose Bld gHb Est-mCnc: 103 mg/dL
Hgb A1c MFr Bld: 5.2 % (ref 4.8–5.6)

## 2019-10-24 LAB — CULTURE, OB URINE

## 2019-10-24 LAB — URINE CULTURE, OB REFLEX

## 2019-10-24 NOTE — Telephone Encounter (Signed)
-----   Message from Catalina Antigua, MD sent at 10/23/2019  2:17 PM EDT ----- Please inform patient of chlamydia infection. Her partner also needs to be informed and treated. They should both abstain for 7 days. Rx sent to her pharmacy

## 2019-10-24 NOTE — Telephone Encounter (Signed)
I called Carrie Cardenas and notified her of wet prep + for Chlamydia and that RX sent to her pharmacy. I explained her partner should go to  His doctor for treatment or call the Health department. I advised no sexual contact until both are treated and then wait 2 weeks. I informed her we will check to see if the infection cleared in a few weeks. She voices understanding. [facility] STD form completed.  Makenze Ellett,RN

## 2019-11-05 ENCOUNTER — Ambulatory Visit: Payer: Medicaid Other

## 2019-11-05 ENCOUNTER — Other Ambulatory Visit: Payer: Self-pay

## 2019-11-07 DIAGNOSIS — O98312 Other infections with a predominantly sexual mode of transmission complicating pregnancy, second trimester: Secondary | ICD-10-CM

## 2019-11-07 MED ORDER — VALACYCLOVIR HCL 1 G PO TABS: 1000.0000 mg | ORAL_TABLET | Freq: Two times a day (BID) | ORAL | 0 refills | Status: DC

## 2019-11-07 NOTE — BH Assessment (Deleted)
error 

## 2019-11-12 ENCOUNTER — Ambulatory Visit: Payer: Medicaid Other | Admitting: Clinical

## 2019-11-12 ENCOUNTER — Other Ambulatory Visit: Payer: Self-pay

## 2019-11-12 DIAGNOSIS — Z91199 Patient's noncompliance with other medical treatment and regimen due to unspecified reason: Secondary | ICD-10-CM

## 2019-11-12 NOTE — BH Specialist Note (Signed)
Pt did not arrive to video visit and did not answer the phone ; Voicemail not set up, so unable to leave voice message; left MyChart message for patient.    

## 2019-11-14 ENCOUNTER — Other Ambulatory Visit: Payer: Self-pay

## 2019-11-14 ENCOUNTER — Other Ambulatory Visit: Payer: Self-pay | Admitting: *Deleted

## 2019-11-14 ENCOUNTER — Ambulatory Visit: Payer: Medicaid Other | Admitting: *Deleted

## 2019-11-14 ENCOUNTER — Ambulatory Visit: Payer: Medicaid Other | Attending: Obstetrics and Gynecology

## 2019-11-14 ENCOUNTER — Encounter: Payer: Self-pay | Admitting: *Deleted

## 2019-11-14 DIAGNOSIS — O99842 Bariatric surgery status complicating pregnancy, second trimester: Secondary | ICD-10-CM

## 2019-11-14 DIAGNOSIS — Z3A22 22 weeks gestation of pregnancy: Secondary | ICD-10-CM

## 2019-11-14 DIAGNOSIS — O09299 Supervision of pregnancy with other poor reproductive or obstetric history, unspecified trimester: Secondary | ICD-10-CM | POA: Diagnosis present

## 2019-11-14 DIAGNOSIS — O0990 Supervision of high risk pregnancy, unspecified, unspecified trimester: Secondary | ICD-10-CM | POA: Diagnosis not present

## 2019-11-14 DIAGNOSIS — A749 Chlamydial infection, unspecified: Secondary | ICD-10-CM | POA: Diagnosis present

## 2019-11-14 DIAGNOSIS — O99210 Obesity complicating pregnancy, unspecified trimester: Secondary | ICD-10-CM

## 2019-11-14 DIAGNOSIS — F419 Anxiety disorder, unspecified: Secondary | ICD-10-CM | POA: Diagnosis present

## 2019-11-14 DIAGNOSIS — O98812 Other maternal infectious and parasitic diseases complicating pregnancy, second trimester: Secondary | ICD-10-CM | POA: Diagnosis present

## 2019-11-14 DIAGNOSIS — D649 Anemia, unspecified: Secondary | ICD-10-CM

## 2019-11-14 DIAGNOSIS — Z8619 Personal history of other infectious and parasitic diseases: Secondary | ICD-10-CM | POA: Diagnosis present

## 2019-11-14 DIAGNOSIS — O99212 Obesity complicating pregnancy, second trimester: Secondary | ICD-10-CM | POA: Diagnosis not present

## 2019-11-14 DIAGNOSIS — O09292 Supervision of pregnancy with other poor reproductive or obstetric history, second trimester: Secondary | ICD-10-CM

## 2019-11-14 DIAGNOSIS — A6009 Herpesviral infection of other urogenital tract: Secondary | ICD-10-CM | POA: Diagnosis present

## 2019-11-14 DIAGNOSIS — Z363 Encounter for antenatal screening for malformations: Secondary | ICD-10-CM

## 2019-11-14 DIAGNOSIS — E669 Obesity, unspecified: Secondary | ICD-10-CM

## 2019-11-14 DIAGNOSIS — O99012 Anemia complicating pregnancy, second trimester: Secondary | ICD-10-CM

## 2019-11-19 ENCOUNTER — Encounter: Payer: Self-pay | Admitting: Obstetrics and Gynecology

## 2019-11-19 ENCOUNTER — Other Ambulatory Visit: Payer: Self-pay

## 2019-11-19 ENCOUNTER — Telehealth (INDEPENDENT_AMBULATORY_CARE_PROVIDER_SITE_OTHER): Payer: Medicaid Other | Admitting: Obstetrics and Gynecology

## 2019-11-19 VITALS — BP 117/90

## 2019-11-19 DIAGNOSIS — O09299 Supervision of pregnancy with other poor reproductive or obstetric history, unspecified trimester: Secondary | ICD-10-CM

## 2019-11-19 DIAGNOSIS — O98812 Other maternal infectious and parasitic diseases complicating pregnancy, second trimester: Secondary | ICD-10-CM

## 2019-11-19 DIAGNOSIS — O0990 Supervision of high risk pregnancy, unspecified, unspecified trimester: Secondary | ICD-10-CM

## 2019-11-19 DIAGNOSIS — A749 Chlamydial infection, unspecified: Secondary | ICD-10-CM | POA: Diagnosis not present

## 2019-11-19 DIAGNOSIS — O09292 Supervision of pregnancy with other poor reproductive or obstetric history, second trimester: Secondary | ICD-10-CM

## 2019-11-19 DIAGNOSIS — O0992 Supervision of high risk pregnancy, unspecified, second trimester: Secondary | ICD-10-CM | POA: Diagnosis not present

## 2019-11-19 DIAGNOSIS — E669 Obesity, unspecified: Secondary | ICD-10-CM

## 2019-11-19 DIAGNOSIS — Z8619 Personal history of other infectious and parasitic diseases: Secondary | ICD-10-CM

## 2019-11-19 DIAGNOSIS — O99210 Obesity complicating pregnancy, unspecified trimester: Secondary | ICD-10-CM

## 2019-11-19 MED ORDER — GOJJI WEIGHT SCALE MISC: 1.0000 | Freq: Every day | 0 refills | Status: DC

## 2019-11-19 NOTE — Progress Notes (Signed)
I connected with  Carrie Cardenas on 11/19/19 at 10:15 AM EDT by telephone and verified that I am speaking with the correct person using two identifiers.   I discussed the limitations, risks, security and privacy concerns of performing an evaluation and management service by telephone and the availability of in person appointments. I also discussed with the patient that there may be a patient responsible charge related to this service. The patient expressed understanding and agreed to proceed.  Janene Madeira Amardeep Beckers, CMA 11/19/2019  10:17 AM

## 2019-11-19 NOTE — Progress Notes (Signed)
MY CHART VIDEO VIRTUAL OBSTETRICS VISIT ENCOUNTER NOTE  I connected with Carrie Cardenas on 11/19/19 at 10:15 AM EDT by My Chart video at home and verified that I am speaking with the correct person using two identifiers. Provider located at [employer] for [employer] [facility] [employer] [facility].   I discussed the limitations, risks, security and privacy concerns of performing an evaluation and management service by My Chart video and the availability of in person appointments. I also discussed with the patient that there may be a patient responsible charge related to this service. The patient expressed understanding and agreed to proceed.  Subjective:  Carrie Cardenas is a 29 y.o. G2P1001 at [redacted]w[redacted]d being followed for ongoing prenatal care.  She is currently monitored for the following issues for this high-risk pregnancy and has Genital warts complicating pregnancy; LGSIL (low grade squamous intraepithelial dysplasia); History of gastric bypass; Acute hypoxemic respiratory failure (HCC); Supervision of high risk pregnancy, antepartum; History of pre-eclampsia in prior pregnancy, currently pregnant; History of postpartum hemorrhage, currently pregnant; History of herpes genitalis; Obesity affecting pregnancy; and Chlamydia infection affecting pregnancy in second trimester, antepartum on their problem list.  Patient reports no complaints. Reports fetal movement. Denies any contractions, bleeding or leaking of fluid.   The following portions of the patient's history were reviewed and updated as appropriate: allergies, current medications, past family history, past medical history, past social history, past surgical history and problem list.   Objective:   General:  Alert, oriented and cooperative.   Mental Status: Normal mood and affect perceived. Normal judgment and thought content.  Rest of physical exam deferred due to type of encounter  BP 117/90   LMP 06/11/2019  **Done by patient's own  at home BP cuff. Patient does not own a scale  Assessment and Plan:  Pregnancy: G2P1001 at [redacted]w[redacted]d  1. Chlamydia infection affecting pregnancy in second trimester, antepartum - Plan TOC nv  2. Supervision of high risk pregnancy, antepartum - Anticipatory guidance of 2 hr GTT nv - Advised to be fasting after midnight the night prior to appointment with nothing to drink but water  3. History of pre-eclampsia in prior pregnancy, currently pregnant - Discussed the proper usage of BP cuff - Teaching of how to insert arm into BP cuff properly, return demonstration performed - Patient continuing to get error messages or high BP's; not certain accurate BP's recorded - No PEC s/sx's - Advised to bring BP cuff with her to her nv - Continue daily bASA  Preterm labor symptoms and general obstetric precautions including but not limited to vaginal bleeding, contractions, leaking of fluid and fetal movement were reviewed in detail with the patient.  I discussed the assessment and treatment plan with the patient. The patient was provided an opportunity to ask questions and all were answered. The patient agreed with the plan and demonstrated an understanding of the instructions. The patient was advised to call back or seek an in-person office evaluation/go to MAU at [facility] for any urgent or concerning symptoms. Please refer to After Visit Summary for other counseling recommendations.   I provided 10 minutes of non-face-to-face time during this encounter. There was 5 minutes of chart review time spent prior to this encounter. Total time spent = 15 minutes.  No follow-ups on file.  Future Appointments  Date Time Provider Department Center  12/12/2019 11:30 AM [facility] NURSE [facility]  12/12/2019 11:30 AM [facility] US3 [facility]    Raelyn Mora, [facility]

## 2019-11-19 NOTE — Patient Instructions (Signed)
Glucose Tolerance Test During Pregnancy Why am I having this test? The glucose tolerance test (GTT) is done to check how your body processes sugar (glucose). This is one of several tests used to diagnose diabetes that develops during pregnancy (gestational diabetes mellitus). Gestational diabetes is a temporary form of diabetes that some women develop during pregnancy. It usually occurs during the second trimester of pregnancy and goes away after delivery. Testing (screening) for gestational diabetes usually occurs between 24 and 28 weeks of pregnancy. You may have the GTT test after having a 1-hour glucose screening test if the results from that test indicate that you may have gestational diabetes. You may also have this test if:  You have a history of gestational diabetes.  You have a history of giving birth to very large babies or have experienced repeated fetal loss (stillbirth).  You have signs and symptoms of diabetes, such as: ? Changes in your vision. ? Tingling or numbness in your hands or feet. ? Changes in hunger, thirst, and urination that are not otherwise explained by your pregnancy. What is being tested? This test measures the amount of glucose in your blood at different times during a period of 3 hours. This indicates how well your body is able to process glucose. What kind of sample is taken?  Blood samples are required for this test. They are usually collected by inserting a needle into a blood vessel. How do I prepare for this test?  For 3 days before your test, eat normally. Have plenty of carbohydrate-rich foods.  Follow instructions from your health care provider about: ? Eating or drinking restrictions on the day of the test. You may be asked to not eat or drink anything other than water (fast) starting 8-10 hours before the test. ? Changing or stopping your regular medicines. Some medicines may interfere with this test. Tell a health care provider about:  All  medicines you are taking, including vitamins, herbs, eye drops, creams, and over-the-counter medicines.  Any blood disorders you have.  Any surgeries you have had.  Any medical conditions you have. What happens during the test? First, your blood glucose will be measured. This is referred to as your fasting blood glucose, since you fasted before the test. Then, you will drink a glucose solution that contains a certain amount of glucose. Your blood glucose will be measured again 1, 2, and 3 hours after drinking the solution. This test takes about 3 hours to complete. You will need to stay at the testing location during this time. During the testing period:  Do not eat or drink anything other than the glucose solution.  Do not exercise.  Do not use any products that contain nicotine or tobacco, such as cigarettes and e-cigarettes. If you need help stopping, ask your health care provider. The testing procedure may vary among health care providers and hospitals. How are the results reported? Your results will be reported as milligrams of glucose per deciliter of blood (mg/dL) or millimoles per liter (mmol/L). Your health care provider will compare your results to normal ranges that were established after testing a large group of people (reference ranges). Reference ranges may vary among labs and hospitals. For this test, common reference ranges are:  Fasting: less than 95-105 mg/dL (5.3-5.8 mmol/L).  1 hour after drinking glucose: less than 180-190 mg/dL (10.0-10.5 mmol/L).  2 hours after drinking glucose: less than 155-165 mg/dL (8.6-9.2 mmol/L).  3 hours after drinking glucose: 140-145 mg/dL (7.8-8.1 mmol/L). What do the   results mean? Results within reference ranges are considered normal, meaning that your glucose levels are well-controlled. If two or more of your blood glucose levels are high, you may be diagnosed with gestational diabetes. If only one level is high, your health care  provider may suggest repeat testing or other tests to confirm a diagnosis. Talk with your health care provider about what your results mean. Questions to ask your health care provider Ask your health care provider, or the department that is doing the test:  When will my results be ready?  How will I get my results?  What are my treatment options?  What other tests do I need?  What are my next steps? Summary  The glucose tolerance test (GTT) is one of several tests used to diagnose diabetes that develops during pregnancy (gestational diabetes mellitus). Gestational diabetes is a temporary form of diabetes that some women develop during pregnancy.  You may have the GTT test after having a 1-hour glucose screening test if the results from that test indicate that you may have gestational diabetes. You may also have this test if you have any symptoms or risk factors for gestational diabetes.  Talk with your health care provider about what your results mean. This information is not intended to replace advice given to you by your health care provider. Make sure you discuss any questions you have with your health care provider. Document Revised: 09/27/2018 Document Reviewed: 01/16/2017 Elsevier Patient Education  2020 Elsevier Inc.  

## 2019-12-09 ENCOUNTER — Inpatient Hospital Stay (HOSPITAL_COMMUNITY)
Admission: AD | Admit: 2019-12-09 | Discharge: 2019-12-09 | Disposition: A | Discharge status: Home / Self Care | Payer: Medicaid Other | Attending: Obstetrics and Gynecology | Admitting: Obstetrics and Gynecology

## 2019-12-09 ENCOUNTER — Encounter (HOSPITAL_COMMUNITY): Payer: Self-pay | Admitting: Obstetrics and Gynecology

## 2019-12-09 ENCOUNTER — Other Ambulatory Visit: Payer: Self-pay

## 2019-12-09 DIAGNOSIS — R519 Headache, unspecified: Secondary | ICD-10-CM | POA: Diagnosis not present

## 2019-12-09 DIAGNOSIS — Z3A25 25 weeks gestation of pregnancy: Secondary | ICD-10-CM | POA: Insufficient documentation

## 2019-12-09 DIAGNOSIS — Z8719 Personal history of other diseases of the digestive system: Secondary | ICD-10-CM | POA: Insufficient documentation

## 2019-12-09 DIAGNOSIS — Z8759 Personal history of other complications of pregnancy, childbirth and the puerperium: Secondary | ICD-10-CM | POA: Insufficient documentation

## 2019-12-09 DIAGNOSIS — O98812 Other maternal infectious and parasitic diseases complicating pregnancy, second trimester: Secondary | ICD-10-CM

## 2019-12-09 DIAGNOSIS — Z79899 Other long term (current) drug therapy: Secondary | ICD-10-CM | POA: Diagnosis not present

## 2019-12-09 DIAGNOSIS — O99212 Obesity complicating pregnancy, second trimester: Secondary | ICD-10-CM

## 2019-12-09 DIAGNOSIS — Z8619 Personal history of other infectious and parasitic diseases: Secondary | ICD-10-CM

## 2019-12-09 DIAGNOSIS — Z7982 Long term (current) use of aspirin: Secondary | ICD-10-CM | POA: Insufficient documentation

## 2019-12-09 DIAGNOSIS — O09292 Supervision of pregnancy with other poor reproductive or obstetric history, second trimester: Secondary | ICD-10-CM | POA: Diagnosis not present

## 2019-12-09 DIAGNOSIS — O26892 Other specified pregnancy related conditions, second trimester: Secondary | ICD-10-CM | POA: Diagnosis not present

## 2019-12-09 DIAGNOSIS — O0990 Supervision of high risk pregnancy, unspecified, unspecified trimester: Secondary | ICD-10-CM

## 2019-12-09 DIAGNOSIS — O09299 Supervision of pregnancy with other poor reproductive or obstetric history, unspecified trimester: Secondary | ICD-10-CM

## 2019-12-09 LAB — COMPREHENSIVE METABOLIC PANEL
ALT: 25 U/L (ref 0–44)
AST: 21 U/L (ref 15–41)
Albumin: 2.9 g/dL — ABNORMAL LOW (ref 3.5–5.0)
Alkaline Phosphatase: 74 U/L (ref 38–126)
Anion gap: 7 (ref 5–15)
BUN: 9 mg/dL (ref 6–20)
CO2: 22 mmol/L (ref 22–32)
Calcium: 8.8 mg/dL — ABNORMAL LOW (ref 8.9–10.3)
Chloride: 109 mmol/L (ref 98–111)
Creatinine, Ser: 0.54 mg/dL (ref 0.44–1.00)
GFR calc Af Amer: >60 mL/min (ref >60)
GFR calc non Af Amer: >60 mL/min (ref >60)
Glucose, Bld: 89 mg/dL (ref 70–99)
Potassium: 3.4 mmol/L — ABNORMAL LOW (ref 3.5–5.1)
Sodium: 138 mmol/L (ref 135–145)
Total Bilirubin: 0.2 mg/dL — ABNORMAL LOW (ref 0.3–1.2)
Total Protein: 6.4 g/dL — ABNORMAL LOW (ref 6.5–8.1)

## 2019-12-09 LAB — URINALYSIS, ROUTINE W REFLEX MICROSCOPIC
Bilirubin Urine: NEGATIVE
Glucose, UA: NEGATIVE mg/dL
Hgb urine dipstick: NEGATIVE
Ketones, ur: NEGATIVE mg/dL
Nitrite: NEGATIVE
Protein, ur: 30 mg/dL — AB
Specific Gravity, Urine: 1.028 (ref 1.005–1.030)
pH: 5 (ref 5.0–8.0)

## 2019-12-09 LAB — CBC
HCT: 28.1 % — ABNORMAL LOW (ref 36.0–46.0)
Hemoglobin: 8.7 g/dL — ABNORMAL LOW (ref 12.0–15.0)
MCH: 25.8 pg — ABNORMAL LOW (ref 26.0–34.0)
MCHC: 31 g/dL (ref 30.0–36.0)
MCV: 83.4 fL (ref 80.0–100.0)
Platelets: 191 10*3/uL (ref 150–400)
RBC: 3.37 MIL/uL — ABNORMAL LOW (ref 3.87–5.11)
RDW: 14.4 % (ref 11.5–15.5)
WBC: 3.7 10*3/uL — ABNORMAL LOW (ref 4.0–10.5)
nRBC: 0 % (ref 0.0–0.2)

## 2019-12-09 MED ORDER — METOCLOPRAMIDE HCL 5 MG/ML IJ SOLN
10.0000 mg | Freq: Once | INTRAMUSCULAR | Status: AC
  Administered 2019-12-09: 10 mg via INTRAVENOUS
  Filled 2019-12-09: qty 2

## 2019-12-09 MED ORDER — SODIUM CHLORIDE 0.9 % IV SOLN
510.0000 mg | Freq: Once | INTRAVENOUS | Status: AC
  Administered 2019-12-09: 510 mg via INTRAVENOUS
  Filled 2019-12-09: qty 17

## 2019-12-09 MED ORDER — DIPHENHYDRAMINE HCL 50 MG/ML IJ SOLN
25.0000 mg | Freq: Once | INTRAMUSCULAR | Status: AC
  Administered 2019-12-09: 25 mg via INTRAVENOUS
  Filled 2019-12-09: qty 1

## 2019-12-09 MED ORDER — DEXAMETHASONE SODIUM PHOSPHATE 10 MG/ML IJ SOLN
10.0000 mg | Freq: Once | INTRAMUSCULAR | Status: AC
  Administered 2019-12-09: 10 mg via INTRAVENOUS
  Filled 2019-12-09: qty 1

## 2019-12-09 MED ORDER — LACTATED RINGERS IV SOLN: Freq: Once | INTRAVENOUS | Status: AC

## 2019-12-09 NOTE — Discharge Instructions (Signed)

## 2019-12-09 NOTE — MAU Provider Note (Signed)
History     CSN: [id number]  Arrival date and time: 12/09/19 1432   First Provider Initiated Contact with Patient 12/09/19 1538      Chief Complaint  Patient presents with  . Headache   HPI Carrie Cardenas is a 29 y.o. G2P1001 at 33w6dwho presents to MAU with chief complaint of headache. This is a recurrent problem, onset three days ago. Patient states her headache resolves with Tylenol, then comes back as soon as that dose wears off. Her pain is anterior, located on her left side above her eye and radiates across her head. She also endorses visual disturbances which accompany her headaches at their apex.   She denies elevated blood pressure in her current pregnancy but has a history of Preeclampsia in her prior pregnancy. She is concerned that may be happening again. She denies RUQ/epigastric pain as well as new onset swelling or weight gain.  She denies vaginal bleeding, leaking of fluid, decreased fetal movement, fever, falls, or recent illness.    OB History    Gravida  2   Para  1   Term  1   Preterm  0   AB  0   Living  1     SAB  0   TAB  0   Ectopic  0   Multiple  0   Live Births  1           Past Medical History:  Diagnosis Date  . Depression    pt hospitalized at 29years old for major depression  . Herpes genitalis in women   . Hypovolemic shock ([place]   . Mallory-Weiss tear 04/07/2016  . Pregnancy induced hypertension   . Retained products of conception, following delivery with hemorrhage 04/05/2016  . Sepsis ([place]   . Severe preeclampsia, third trimester 03/30/2016    Past Surgical History:  Procedure Laterality Date  . DILATION AND EVACUATION N/A 04/05/2016   Procedure: DILATATION AND EVACUATION;  Surgeon: UOsborne Oman MD;  [facility];  Service: Gynecology;  Laterality: N/A;  . ESOPHAGOGASTRODUODENOSCOPY N/A 04/07/2016   Procedure: ESOPHAGOGASTRODUODENOSCOPY (EGD);  Surgeon: VWilford Corner MD;  [facility];   Service: Endoscopy;  Laterality: N/A;  . ESOPHAGOGASTRODUODENOSCOPY N/A 04/07/2016   Procedure: ESOPHAGOGASTRODUODENOSCOPY (EGD);  Surgeon: VWilford Corner MD;  [place] [facility]  Service: Endoscopy;  Laterality: N/A;  . ESOPHAGOGASTRODUODENOSCOPY N/A 04/08/2016   Procedure: ESOPHAGOGASTRODUODENOSCOPY (EGD);  Surgeon: DAlphonsa Overall MD;  [place] [facility]  Service: General;  Laterality: N/A;  . GASTRIC BYPASS    . SLEEVE GASTROPLASTY  2015    Family History  Problem Relation Age of Onset  . Fibroids Mother   . Anemia Mother   . Pulmonary embolism Mother   . Cancer Maternal Grandmother     Social History   Tobacco Use  . Smoking status: Never Smoker  . Smokeless tobacco: Never Used  Vaping Use  . Vaping Use: Never used  Substance Use Topics  . Alcohol use: No  . Drug use: No    Allergies: No Known Allergies  Medications Prior to Admission  Medication Sig Dispense Refill Last Dose  . aspirin EC 81 MG tablet Take 1 tablet (81 mg total) by mouth daily. Take after 12 weeks for prevention of preeclampsia later in pregnancy 300 tablet 2   . Blood Pressure Monitoring (BLOOD PRESSURE KIT) DEVI 1 Device by Does not apply route as needed. 1 each 0   . ferrous sulfate (FERROUSUL) 325 (65 FE) MG tablet  Take 1 tablet (325 mg total) by mouth 2 (two) times daily. 60 tablet 1   . Misc. Devices (GOJJI WEIGHT SCALE) MISC 1 Device by Does not apply route daily. 1 each 0   . ondansetron (ZOFRAN ODT) 4 MG disintegrating tablet Take 1 tablet (4 mg total) by mouth every 8 (eight) hours as needed for nausea or vomiting. 15 tablet 0   . Prenatal Vit-Fe Fumarate-FA (PREPLUS) 27-1 MG TABS Take 1 tablet by mouth daily.       Review of Systems  Eyes: Positive for photophobia and visual disturbance.  Gastrointestinal: Negative for abdominal pain.  Neurological: Positive for headaches.  All other systems reviewed and are negative.  Physical Exam   Blood pressure 129/75, pulse 86, resp. rate  18, height _0  (1.651 m), weight 127 kg, last menstrual period 06/11/2019, SpO2 100 %, currently breastfeeding.  Physical Exam  Nursing note and vitals reviewed. Constitutional: She is oriented to person, place, and time. She appears well-developed.  Cardiovascular: Normal rate and normal heart sounds.  Respiratory: Effort normal and breath sounds normal.  GI:  Gravid  Neurological: She is alert and oriented to person, place, and time.  Skin: Skin is warm and dry. Capillary refill takes less than 2 seconds.  Psychiatric: Her behavior is normal. Mood normal.    MAU Course/MDM  Procedures   --Reassuring fetal surveillance --Toco: quiet  Patient Vitals for the past 24 hrs:  BP Temp Temp src Pulse Resp SpO2 Height Weight  12/09/19 1849 126/75 98.3 F (36.8 C) Oral 84 18 100 % -- --  12/09/19 1450 129/75 -- Oral 86 18 100 % _1  (1.651 m) 127 kg   Orders Placed This Encounter  Procedures  . Urinalysis, Routine w reflex microscopic  . CBC  . Comprehensive metabolic panel  . Insert peripheral IV   Meds ordered this encounter  Medications  . metoCLOPramide (REGLAN) injection 10 mg  . diphenhydrAMINE (BENADRYL) injection 25 mg  . dexamethasone (DECADRON) injection 10 mg  . lactated ringers infusion  . ferumoxytol (FERAHEME) 510 mg in sodium chloride 0.9 % 100 mL IVPB   Results for orders placed or performed during the hospital encounter of 12/09/19 (from the past 24 hour(s))  CBC     Status: Abnormal   Collection Time: 12/09/19  3:04 PM  Result Value Ref Range   WBC 3.7 (L) 4.0 - 10.5 K/uL   RBC 3.37 (L) 3.87 - 5.11 MIL/uL   Hemoglobin 8.7 (L) 12.0 - 15.0 g/dL   HCT 28.1 (L) 36 - 46 %   MCV 83.4 80.0 - 100.0 fL   MCH 25.8 (L) 26.0 - 34.0 pg   MCHC 31.0 30.0 - 36.0 g/dL   RDW 14.4 11.5 - 15.5 %   Platelets 191 150 - 400 K/uL   nRBC 0.0 0.0 - 0.2 %  Comprehensive metabolic panel     Status: Abnormal   Collection Time: 12/09/19  3:04 PM  Result Value Ref Range    Sodium 138 135 - 145 mmol/L   Potassium 3.4 (L) 3.5 - 5.1 mmol/L   Chloride 109 98 - 111 mmol/L   CO2 22 22 - 32 mmol/L   Glucose, Bld 89 70 - 99 mg/dL   BUN 9 6 - 20 mg/dL   Creatinine, Ser 0.54 0.44 - 1.00 mg/dL   Calcium 8.8 (L) 8.9 - 10.3 mg/dL   Total Protein 6.4 (L) 6.5 - 8.1 g/dL   Albumin 2.9 (L) 3.5 - 5.0 g/dL  AST 21 15 - 41 U/L   ALT 25 0 - 44 U/L   Alkaline Phosphatase 74 38 - 126 U/L   Total Bilirubin 0.2 (L) 0.3 - 1.2 mg/dL   GFR calc non Af Amer >60 >60 mL/min   GFR calc Af Amer >60 >60 mL/min   Anion gap 7 5 - 15  Urinalysis, Routine w reflex microscopic     Status: Abnormal   Collection Time: 12/09/19  3:30 PM  Result Value Ref Range   Color, Urine AMBER (A) YELLOW   APPearance CLOUDY (A) CLEAR   Specific Gravity, Urine 1.028 1.005 - 1.030   pH 5.0 5.0 - 8.0   Glucose, UA NEGATIVE NEGATIVE mg/dL   Hgb urine dipstick NEGATIVE NEGATIVE   Bilirubin Urine NEGATIVE NEGATIVE   Ketones, ur NEGATIVE NEGATIVE mg/dL   Protein, ur 30 (A) NEGATIVE mg/dL   Nitrite NEGATIVE NEGATIVE   Leukocytes,Ua LARGE (A) NEGATIVE   RBC / HPF 0-5 0 - 5 RBC/hpf   WBC, UA 6-10 0 - 5 WBC/hpf   Bacteria, UA MANY (A) NONE SEEN   Squamous Epithelial / LPF 11-20 0 - 5   Mucus PRESENT    Assessment and Plan  --29 y.o. G2P1001 at [redacted]w[redacted]d --Reactive tracing --Headache resolved with IV headache cocktail --Feraheme 1 of 2 given in MAU --Normotensive in MAU --Discharge home in stable condition  F/U: --MFM 12/12/2019 --In basket message sent to [facility] staff to scheduled Feraheme 2 of 2 in about 7-10 days. Patient aware of impending scheduling call/message  SDarlina Rumpf CNM 12/09/2019, 7:44 PM

## 2019-12-09 NOTE — MAU Note (Signed)
Presents with c/o H/A x3 days, relieved with Tylenol but H/A returns.  Currently has mild H/A and visual disturbances.  Denies epigastric pain.  Reports Hx of elevated BP in previous pregnancy.  Denies VB or LOF.  Endorses +FM.

## 2019-12-10 ENCOUNTER — Telehealth (INDEPENDENT_AMBULATORY_CARE_PROVIDER_SITE_OTHER): Payer: Medicaid Other | Admitting: General Practice

## 2019-12-10 DIAGNOSIS — Z3482 Encounter for supervision of other normal pregnancy, second trimester: Secondary | ICD-10-CM

## 2019-12-10 NOTE — Telephone Encounter (Signed)
-----   Message from Calvert Cantor, [place] sent at 12/09/2019  7:23 PM EDT ----- Regarding: Schedule 2nd Feraheme Patient receives 1st of two Feraheme injections. Please schedule second administration around 12/16/2019. Order placed in signed and held. Thanks! SW, CNM

## 2019-12-10 NOTE — Telephone Encounter (Signed)
Scheduled feraheme appt 6/28 @ 9am. Called & informed patient. Patient verbalized understanding & had no questions.

## 2019-12-11 LAB — CULTURE, OB URINE: Culture: <10000 — AB

## 2019-12-12 ENCOUNTER — Other Ambulatory Visit: Payer: Self-pay | Admitting: *Deleted

## 2019-12-12 ENCOUNTER — Other Ambulatory Visit: Payer: Self-pay

## 2019-12-12 ENCOUNTER — Ambulatory Visit: Payer: Medicaid Other | Admitting: *Deleted

## 2019-12-12 ENCOUNTER — Ambulatory Visit: Payer: Medicaid Other | Attending: Obstetrics and Gynecology

## 2019-12-12 DIAGNOSIS — O99210 Obesity complicating pregnancy, unspecified trimester: Secondary | ICD-10-CM

## 2019-12-12 DIAGNOSIS — O0990 Supervision of high risk pregnancy, unspecified, unspecified trimester: Secondary | ICD-10-CM | POA: Insufficient documentation

## 2019-12-12 DIAGNOSIS — O99012 Anemia complicating pregnancy, second trimester: Secondary | ICD-10-CM | POA: Diagnosis not present

## 2019-12-12 DIAGNOSIS — O09299 Supervision of pregnancy with other poor reproductive or obstetric history, unspecified trimester: Secondary | ICD-10-CM

## 2019-12-12 DIAGNOSIS — A749 Chlamydial infection, unspecified: Secondary | ICD-10-CM

## 2019-12-12 DIAGNOSIS — Z8619 Personal history of other infectious and parasitic diseases: Secondary | ICD-10-CM

## 2019-12-12 DIAGNOSIS — O99842 Bariatric surgery status complicating pregnancy, second trimester: Secondary | ICD-10-CM

## 2019-12-12 DIAGNOSIS — O09292 Supervision of pregnancy with other poor reproductive or obstetric history, second trimester: Secondary | ICD-10-CM | POA: Diagnosis not present

## 2019-12-12 DIAGNOSIS — O98812 Other maternal infectious and parasitic diseases complicating pregnancy, second trimester: Secondary | ICD-10-CM | POA: Diagnosis present

## 2019-12-12 DIAGNOSIS — Z3A26 26 weeks gestation of pregnancy: Secondary | ICD-10-CM

## 2019-12-12 DIAGNOSIS — Z8759 Personal history of other complications of pregnancy, childbirth and the puerperium: Secondary | ICD-10-CM

## 2019-12-12 DIAGNOSIS — O99212 Obesity complicating pregnancy, second trimester: Secondary | ICD-10-CM | POA: Diagnosis not present

## 2019-12-12 DIAGNOSIS — Z363 Encounter for antenatal screening for malformations: Secondary | ICD-10-CM

## 2019-12-13 NOTE — Addendum Note (Signed)
Addended by: Reva Bores on: 12/13/2019 06:36 PM   Modules accepted: Orders

## 2019-12-16 ENCOUNTER — Inpatient Hospital Stay (HOSPITAL_COMMUNITY): Admission: RE | Admit: 2019-12-16 | Payer: Medicaid Other | Source: Ambulatory Visit

## 2019-12-24 ENCOUNTER — Telehealth: Payer: Self-pay

## 2019-12-24 ENCOUNTER — Other Ambulatory Visit: Payer: Medicaid Other

## 2019-12-24 ENCOUNTER — Ambulatory Visit (INDEPENDENT_AMBULATORY_CARE_PROVIDER_SITE_OTHER): Payer: Medicaid Other | Admitting: Nurse Practitioner

## 2019-12-24 ENCOUNTER — Other Ambulatory Visit: Payer: Self-pay | Admitting: Lactation Services

## 2019-12-24 ENCOUNTER — Other Ambulatory Visit: Payer: Self-pay

## 2019-12-24 VITALS — BP 109/79 | HR 86 | Wt 273.0 lb

## 2019-12-24 DIAGNOSIS — Z8619 Personal history of other infectious and parasitic diseases: Secondary | ICD-10-CM

## 2019-12-24 DIAGNOSIS — Z23 Encounter for immunization: Secondary | ICD-10-CM | POA: Diagnosis not present

## 2019-12-24 DIAGNOSIS — O0990 Supervision of high risk pregnancy, unspecified, unspecified trimester: Secondary | ICD-10-CM

## 2019-12-24 DIAGNOSIS — A749 Chlamydial infection, unspecified: Secondary | ICD-10-CM

## 2019-12-24 DIAGNOSIS — O99013 Anemia complicating pregnancy, third trimester: Secondary | ICD-10-CM

## 2019-12-24 DIAGNOSIS — O98812 Other maternal infectious and parasitic diseases complicating pregnancy, second trimester: Secondary | ICD-10-CM

## 2019-12-24 DIAGNOSIS — Z3A28 28 weeks gestation of pregnancy: Secondary | ICD-10-CM

## 2019-12-24 DIAGNOSIS — O0993 Supervision of high risk pregnancy, unspecified, third trimester: Secondary | ICD-10-CM | POA: Diagnosis not present

## 2019-12-24 DIAGNOSIS — E669 Obesity, unspecified: Secondary | ICD-10-CM

## 2019-12-24 DIAGNOSIS — D649 Anemia, unspecified: Secondary | ICD-10-CM

## 2019-12-24 DIAGNOSIS — O99212 Obesity complicating pregnancy, second trimester: Secondary | ICD-10-CM

## 2019-12-24 MED ORDER — VALACYCLOVIR HCL 500 MG PO TABS
500.0000 mg | ORAL_TABLET | Freq: Two times a day (BID) | ORAL | 5 refills | Status: AC
Start: 2019-12-24 — End: ?

## 2019-12-24 NOTE — Progress Notes (Signed)
Medicaid Home Form Completed-12/24/19

## 2019-12-24 NOTE — Patient Instructions (Signed)
Sign up for breastfeeding classes on line.

## 2019-12-24 NOTE — Progress Notes (Signed)
    Subjective:  Carrie Cardenas is a 29 y.o. G2P1001 at [redacted]w[redacted]d being seen today for ongoing prenatal care.  She is currently monitored for the following issues for this high-risk pregnancy and has Genital warts complicating pregnancy; LGSIL (low grade squamous intraepithelial dysplasia); History of gastric bypass; Acute hypoxemic respiratory failure (HCC); Supervision of high risk pregnancy, antepartum; History of pre-eclampsia in prior pregnancy, currently pregnant; History of postpartum hemorrhage, currently pregnant; History of herpes genitalis; Obesity affecting pregnancy; and Chlamydia infection affecting pregnancy in second trimester, antepartum on their problem list.  Patient reports no complaints.  Contractions: Not present. Vag. Bleeding: None.  Movement: Present. Denies leaking of fluid.   Missed Fereheme appointment.  Could not find the place she needed to be in the hospital.  Will reschedule.  The following portions of the patient's history were reviewed and updated as appropriate: allergies, current medications, past family history, past medical history, past social history, past surgical history and problem list. Problem list updated.  Objective:   Vitals:   12/24/19 0946  BP: 109/79  Pulse: 86  Weight: 273 lb (123.8 kg)    Fetal Status: Fetal Heart Rate (bpm): 148   Movement: Present     General:  Alert, oriented and cooperative. Patient is in no acute distress.  Skin: Skin is warm and dry. No rash noted.   Cardiovascular: Normal heart rate noted  Respiratory: Normal respiratory effort, no problems with respiration noted  Abdomen: Soft, gravid, appropriate for gestational age. Pain/Pressure: Absent     Pelvic:  Cervical exam deferred        Extremities: Normal range of motion.  Edema: None  Mental Status: Normal mood and affect. Normal behavior. Normal judgment and thought content.   Urinalysis:      Assessment and Plan:  Pregnancy: G2P1001 at [redacted]w[redacted]d  1. Supervision  of high risk pregnancy, antepartum 2 hour glucola today - Tdap vaccine greater than or equal to 7yo IM  2. Chlamydia infection affecting pregnancy in second trimester, antepartum Will do TOC on urine specimen today  3. History of herpes genitalis Had an outbreak in this pregnancy so will start suppression now.  Valtrex ordered and reviewed with client to take BID throughout pregnancy.  4. Obesity affecting pregnancy in second trimester   5. Anemia affecting pregnancy in third trimester Fereheme rescheduled.  Preterm labor symptoms and general obstetric precautions including but not limited to vaginal bleeding, contractions, leaking of fluid and fetal movement were reviewed in detail with the patient. Please refer to After Visit Summary for other counseling recommendations.  Return in about 2 weeks (around 01/07/2020) for ROB.  Nolene Bernheim, RN, MSN, NP-BC Nurse Practitioner, [facility] for [employer], [facility] 12/24/2019 10:05 AM

## 2019-12-24 NOTE — Telephone Encounter (Signed)
Called pt to advise of Feraheme appointment on 12/30/19 at 12pm.PT advised understanding.

## 2019-12-24 NOTE — Progress Notes (Signed)
Pts 2nd Carrie Cardenas has been rescheduled on 12/30/19 @ 12pm.Advised Pt via My Chart.

## 2019-12-25 LAB — RPR: RPR Ser Ql: NONREACTIVE

## 2019-12-25 LAB — CBC
Hematocrit: 29.4 % — ABNORMAL LOW (ref 34.0–46.6)
Hemoglobin: 9.4 g/dL — ABNORMAL LOW (ref 11.1–15.9)
MCH: 26.6 pg (ref 26.6–33.0)
MCHC: 32 g/dL (ref 31.5–35.7)
MCV: 83 fL (ref 79–97)
Platelets: 156 10*3/uL (ref 150–450)
RBC: 3.54 x10E6/uL — ABNORMAL LOW (ref 3.77–5.28)
RDW: 16.1 % — ABNORMAL HIGH (ref 11.7–15.4)
WBC: 3.7 10*3/uL (ref 3.4–10.8)

## 2019-12-25 LAB — HIV ANTIBODY (ROUTINE TESTING W REFLEX): HIV Screen 4th Generation wRfx: NONREACTIVE

## 2019-12-25 LAB — GLUCOSE TOLERANCE, 2 HOURS W/ 1HR
Glucose, 1 hour: 110 mg/dL (ref 65–179)
Glucose, 2 hour: 81 mg/dL (ref 65–152)
Glucose, Fasting: 73 mg/dL (ref 65–91)

## 2019-12-30 ENCOUNTER — Ambulatory Visit (HOSPITAL_COMMUNITY)
Admission: RE | Admit: 2019-12-30 | Discharge: 2019-12-30 | Disposition: A | Discharge status: Home / Self Care | Payer: Medicaid Other | Source: Ambulatory Visit | Attending: Family Medicine | Admitting: Family Medicine

## 2019-12-30 ENCOUNTER — Other Ambulatory Visit: Payer: Self-pay

## 2019-12-30 DIAGNOSIS — Z3A28 28 weeks gestation of pregnancy: Secondary | ICD-10-CM | POA: Diagnosis not present

## 2019-12-30 DIAGNOSIS — O99013 Anemia complicating pregnancy, third trimester: Secondary | ICD-10-CM | POA: Insufficient documentation

## 2019-12-30 DIAGNOSIS — D509 Iron deficiency anemia, unspecified: Secondary | ICD-10-CM | POA: Insufficient documentation

## 2019-12-30 MED ORDER — SODIUM CHLORIDE 0.9 % IV SOLN
510.0000 mg | Freq: Once | INTRAVENOUS | Status: AC
  Administered 2019-12-30: 510 mg via INTRAVENOUS
  Filled 2019-12-30: qty 17

## 2020-01-06 ENCOUNTER — Encounter: Payer: Medicaid Other | Admitting: Nurse Practitioner

## 2020-01-06 ENCOUNTER — Encounter (HOSPITAL_COMMUNITY): Payer: Medicaid Other

## 2020-01-07 ENCOUNTER — Encounter: Payer: Self-pay | Admitting: Obstetrics & Gynecology

## 2020-01-07 DIAGNOSIS — D509 Iron deficiency anemia, unspecified: Secondary | ICD-10-CM | POA: Insufficient documentation

## 2020-01-17 ENCOUNTER — Ambulatory Visit (INDEPENDENT_AMBULATORY_CARE_PROVIDER_SITE_OTHER): Payer: Medicaid Other | Admitting: Medical

## 2020-01-17 ENCOUNTER — Other Ambulatory Visit (HOSPITAL_COMMUNITY)
Admission: RE | Admit: 2020-01-17 | Discharge: 2020-01-17 | Disposition: A | Discharge status: Home / Self Care | Payer: Medicaid Other | Source: Ambulatory Visit | Attending: Medical | Admitting: Medical

## 2020-01-17 ENCOUNTER — Other Ambulatory Visit: Payer: Self-pay

## 2020-01-17 ENCOUNTER — Encounter: Payer: Self-pay | Admitting: Medical

## 2020-01-17 VITALS — BP 125/60 | HR 81 | Wt 287.0 lb

## 2020-01-17 DIAGNOSIS — O99013 Anemia complicating pregnancy, third trimester: Secondary | ICD-10-CM | POA: Diagnosis not present

## 2020-01-17 DIAGNOSIS — D509 Iron deficiency anemia, unspecified: Secondary | ICD-10-CM

## 2020-01-17 DIAGNOSIS — Z9884 Bariatric surgery status: Secondary | ICD-10-CM

## 2020-01-17 DIAGNOSIS — Z3A31 31 weeks gestation of pregnancy: Secondary | ICD-10-CM

## 2020-01-17 DIAGNOSIS — O0990 Supervision of high risk pregnancy, unspecified, unspecified trimester: Secondary | ICD-10-CM

## 2020-01-17 DIAGNOSIS — O99213 Obesity complicating pregnancy, third trimester: Secondary | ICD-10-CM

## 2020-01-17 DIAGNOSIS — R87612 Low grade squamous intraepithelial lesion on cytologic smear of cervix (LGSIL): Secondary | ICD-10-CM

## 2020-01-17 DIAGNOSIS — A749 Chlamydial infection, unspecified: Secondary | ICD-10-CM

## 2020-01-17 DIAGNOSIS — O09299 Supervision of pregnancy with other poor reproductive or obstetric history, unspecified trimester: Secondary | ICD-10-CM

## 2020-01-17 DIAGNOSIS — O98812 Other maternal infectious and parasitic diseases complicating pregnancy, second trimester: Secondary | ICD-10-CM

## 2020-01-17 DIAGNOSIS — Z8619 Personal history of other infectious and parasitic diseases: Secondary | ICD-10-CM

## 2020-01-17 NOTE — Progress Notes (Signed)
PRENATAL VISIT NOTE  Subjective:  Carrie Cardenas is a 29 y.o. G2P1001 at [redacted]w[redacted]d being seen today for ongoing prenatal care.  She is currently monitored for the following issues for this high risk pregnancy and has Genital warts complicating pregnancy; LGSIL on Pap smear of cervix; History of gastric bypass; Acute hypoxemic respiratory failure (HCC); Supervision of high risk pregnancy, antepartum; History of pre-eclampsia in prior pregnancy, currently pregnant; History of postpartum hemorrhage, currently pregnant; History of herpes genitalis; Obesity affecting pregnancy; Chlamydia infection affecting pregnancy in second trimester, antepartum; and Maternal iron deficiency anemia complicating pregnancy in third trimester on their problem list.  Patient reports fatigue, no bleeding, no contractions, no cramping, no leaking and difficulty sleeping..  Contractions: Not present. Vag. Bleeding: None.  Movement: Present. Denies leaking of fluid.   Sleeping Difficulties Reports difficulty getting comfortable and shutting her mind down at night to fall asleep. Denies lights on, screen time prior to bed, late night eating. She has tried melatonin, but this has not been helpful.   Complicated Vaginal Delivery-Previous She would like to discuss options of potential C-section due to complications after her last delivery that resulted in multiple hospitalizations and the need for mechanical ventilation and life support.  The following portions of the patient's history were reviewed and updated as appropriate: allergies, current medications, past family history, past medical history, past social history, past surgical history and problem list. Problem list updated.  Objective:   Vitals:   01/17/20 0835  BP: (!) 125/60  Pulse: 81  Weight: (!) 287 lb (130.2 kg)    Fetal Status: Fetal Heart Rate (bpm): 130   Movement: Present     General:  Alert, oriented and cooperative. Patient is in no acute distress.    Skin: Skin is warm and dry. No rash noted.   Cardiovascular: Normal heart rate noted  Respiratory: Normal respiratory effort, no problems with respiration noted  Abdomen: Soft, gravid, appropriate for gestational age.  Pain/Pressure: Absent     Pelvic: Cervical exam deferred        Extremities: Normal range of motion.  Edema: None  Mental Status:  Normal mood and affect. Normal behavior. Normal judgment and thought content.   Assessment and Plan:  Pregnancy: G2P1001 at [redacted]w[redacted]d  1. Supervision of high risk pregnancy, antepartum Pregnancy progressing without complication as of this time.  BP good today.  Due for HepC testing- Plan to add to routine blood work at [employer] visit.  Ongoing fatigue present in the setting of historically low hemoglobin and sleep difficulties- recent second Ferriheme infusion- plan to recheck labs at 36 week visit. Discussed safe methods to help with sleeping including appropriate sleep hygiene, melatonin, lavender essential oils, magnesium supplementation, or benadryl.   Concerns about vaginal delivery in the setting of post-partum complications discussed with patient. She is interested in speaking to surgeon about the possibility of caesarian delivery. Discussed ceasarian as a major abdominal surgery and not without risks to be considered. Given her history, we will plan for her to meet with surgeon at her next visit to discuss the risks and benefits of potential ceasarian delivery and evaluate for candidacy for this option.  Discussed signing up for breast feeding classes on [facility] [place] website in the near future.  Discussed contraception options after delivery including IUD and Nexplanon. She does have questions about tubal ligation- at this time she does not think she would like to have additional children. Discussed that this is a permanent action and if she is not  completely sure that she does not wish to have additional children, other options may be beneficial.    2.  Chlamydia infection affecting pregnancy in second trimester, antepartum Treatment completed.  No symptoms.  Test of cure today.  - Cervicovaginal ancillary only( [place])  3. Maternal iron deficiency anemia complicating pregnancy in third trimester Completion of Ferriheme infusions x2. Will plan to recheck CBC at 36 week visit unless needed sooner for worsening symptoms or noted bleeding.   4. History of pre-eclampsia in prior pregnancy, currently pregnant Blood pressures stable today.  Patient unable to routinely monitor blood pressure at home due to non-fitting cuff.  Discussed the importance of daily blood pressure monitoring, specifically given her history of pre-eclampsia.  Patient plans to exchange cuff for appropriate size today.  Patient has not been taking daily aspirin. We will not plan to restart this medication at this time given the lack of benefit.  Plan to start weekly blood pressure monitoring.  Discussed the importance of reporting systolic blood pressure readings >140 or diastolic readings >90 not resolved with rest and hydration.   5. History of postpartum hemorrhage, currently pregnant No signs of bleeding or concerns today.  Will continue to monitor throughout the pregnancy and post-partum period.   6. History of herpes genitalis Taking valacyclovir daily as suppressive therapy after outbreak in [name] pregnancy.  No recent outbreaks or new lesions present.  Discussed the importance of continuing daily suppressive therapy as the pregnancy progresses.   7. History of gastric bypass No concerns today.  Will continue to monitor for complications throughout the remainder of pregnancy and post-partum period.   8. Obesity affecting pregnancy in third trimester Total pregnancy weight gain of 47 pounds today.  Discussed the importance of good food choices during pregnancy.  Discouraged any attempts to lose weight or restrict food intake during pregnancy.  Growth  scan scheduled for 01/23/2020 to monitor fetal size- patient reminded of this visit.  Will continue to monitor weight throughout the remainder of pregnancy and encourage healthy food options.   9. LGSIL on Pap smear of cervix No concerns today.  Will continue to monitor.   10. [redacted] weeks gestation of pregnancy [redacted]w[redacted]d at current visit.  Discussed expectations of pregnancy during this period.  Growth scan scheduled for 01/23/2020 to monitor fetal size- patient reminded of this visit.  No signs of preterm labor today.  Discussed common symptoms during this period including difficulty sleeping and getting comfortable and ways to help reduce these.   Preterm labor symptoms and general obstetric precautions including but not limited to vaginal bleeding, contractions, leaking of fluid and fetal movement were reviewed in detail with the patient. Please refer to After Visit Summary for other counseling recommendations.  Return in about 2 weeks (around 01/31/2020) for In-Person, [facility] with MD.   Tollie Eth, NP  Attestation of Supervision:  I confirm that I have verified the information documented in the nurse practitioner note and that I have also personally reperformed the history, physical exam and all medical decision making activities.  I have verified that all services and findings are accurately documented in this student's note; and I agree with management and plan as outlined in the documentation. I have also made any necessary editorial changes.  Advised of normal third trimester labs from previous visit  Last [place] 12/12/19 - EFW 78%, follow-up for growth on 8/5 scheduled   Vonzella Nipple, PA-C [facility] for [employer], [facility] 01/17/2020 11:58 AM

## 2020-01-17 NOTE — Patient Instructions (Addendum)
Fetal Movement Counts Patient Name: ________________________________________________ Patient Due Date: ____________________ What is a fetal movement count?  A fetal movement count is the number of times that you feel your baby move during a certain amount of time. This may also be called a fetal kick count. A fetal movement count is recommended for every pregnant woman. You may be asked to start counting fetal movements as early as week 28 of your pregnancy. Pay attention to when your baby is most active. You may notice your baby's sleep and wake cycles. You may also notice things that make your baby move more. You should do a fetal movement count:  When your baby is normally most active.  At the same time each day. A good time to count movements is while you are resting, after having something to eat and drink. How do I count fetal movements? 1. Find a quiet, comfortable area. Sit, or lie down on your side. 2. Write down the date, the start time and stop time, and the number of movements that you felt between those two times. Take this information with you to your health care visits. 3. Write down your start time when you feel the first movement. 4. Count kicks, flutters, swishes, rolls, and jabs. You should feel at least 10 movements. 5. You may stop counting after you have felt 10 movements, or if you have been counting for 2 hours. Write down the stop time. 6. If you do not feel 10 movements in 2 hours, contact your health care provider for further instructions. Your health care provider may want to do additional tests to assess your baby's well-being. Contact a health care provider if:  You feel fewer than 10 movements in 2 hours.  Your baby is not moving like he or she usually does. Date: ____________ Start time: ____________ Stop time: ____________ Movements: ____________ Date: ____________ Start time: ____________ Stop time: ____________ Movements: ____________ Date: ____________  Start time: ____________ Stop time: ____________ Movements: ____________ Date: ____________ Start time: ____________ Stop time: ____________ Movements: ____________ Date: ____________ Start time: ____________ Stop time: ____________ Movements: ____________ Date: ____________ Start time: ____________ Stop time: ____________ Movements: ____________ Date: ____________ Start time: ____________ Stop time: ____________ Movements: ____________ Date: ____________ Start time: ____________ Stop time: ____________ Movements: ____________ Date: ____________ Start time: ____________ Stop time: ____________ Movements: ____________ This information is not intended to replace advice given to you by your health care provider. Make sure you discuss any questions you have with your health care provider. Document Revised: 01/24/2019 Document Reviewed: 01/24/2019 Elsevier Patient Education  2020 Elsevier Inc. Braxton Hicks Contractions Contractions of the uterus can occur throughout pregnancy, but they are not always a sign that you are in labor. You may have practice contractions called Braxton Hicks contractions. These false labor contractions are sometimes confused with true labor. What are Braxton Hicks contractions? Braxton Hicks contractions are tightening movements that occur in the muscles of the uterus before labor. Unlike true labor contractions, these contractions do not result in opening (dilation) and thinning of the cervix. Toward the end of pregnancy (32-34 weeks), Braxton Hicks contractions can happen more often and may become stronger. These contractions are sometimes difficult to tell apart from true labor because they can be very uncomfortable. You should not feel embarrassed if you go to the hospital with false labor. Sometimes, the only way to tell if you are in true labor is for your health care provider to look for changes in the cervix. The health care provider   will do a physical exam and may  monitor your contractions. If you are not in true labor, the exam should show that your cervix is not dilating and your water has not broken. If there are no other health problems associated with your pregnancy, it is completely safe for you to be sent home with false labor. You may continue to have Braxton Hicks contractions until you go into true labor. How to tell the difference between true labor and false labor True labor  Contractions last 30-70 seconds.  Contractions become very regular.  Discomfort is usually felt in the top of the uterus, and it spreads to the lower abdomen and low back.  Contractions do not go away with walking.  Contractions usually become more intense and increase in frequency.  The cervix dilates and gets thinner. False labor  Contractions are usually shorter and not as strong as true labor contractions.  Contractions are usually irregular.  Contractions are often felt in the front of the lower abdomen and in the groin.  Contractions may go away when you walk around or change positions while lying down.  Contractions get weaker and are shorter-lasting as time goes on.  The cervix usually does not dilate or become thin. Follow these instructions at home:   Take over-the-counter and prescription medicines only as told by your health care provider.  Keep up with your usual exercises and follow other instructions from your health care provider.  Eat and drink lightly if you think you are going into labor.  If Braxton Hicks contractions are making you uncomfortable: ? Change your position from lying down or resting to walking, or change from walking to resting. ? Sit and rest in a tub of warm water. ? Drink enough fluid to keep your urine pale yellow. Dehydration may cause these contractions. ? Do slow and deep breathing several times an hour.  Keep all follow-up prenatal visits as told by your health care provider. This is important. Contact a  health care provider if:  You have a fever.  You have continuous pain in your abdomen. Get help right away if:  Your contractions become stronger, more regular, and closer together.  You have fluid leaking or gushing from your vagina.  You pass blood-tinged mucus (bloody show).  You have bleeding from your vagina.  You have low back pain that you never had before.  You feel your baby's head pushing down and causing pelvic pressure.  Your baby is not moving inside you as much as it used to. Summary  Contractions that occur before labor are called Braxton Hicks contractions, false labor, or practice contractions.  Braxton Hicks contractions are usually shorter, weaker, farther apart, and less regular than true labor contractions. True labor contractions usually become progressively stronger and regular, and they become more frequent.  Manage discomfort from [facility] contractions by changing position, resting in a warm bath, drinking plenty of water, or practicing deep breathing. This information is not intended to replace advice given to you by your health care provider. Make sure you discuss any questions you have with your health care provider. Document Revised: 05/19/2017 Document Reviewed: 10/20/2016 Elsevier Patient Education  2020 [employer].  [employer]  Department of Social [facility] [address], [place], [place] [address] [phone]   or  www.https://hall.info/ **SNAP/EBT/ Other nutritional benefits  [facility] [address] [place], [place], [place] [address] [phone]  or  https://king.net/ **WIC for  women who are pregnant  and postpartum, infants and children up to 94 years old  [employer] [address] [place], [place] [address] [phone]   or   www.theblessedtable.org    **Food pantry  [employer] [address] [place], [place], [place] [address] [phone]   or   https://brotherkolbes.godaddysites.com  **Emergency food and prepared meals  [address] [place] [employer] [address], [place], [place] [address] [phone]   or   www.cedargrovetop.us **Food pantry  [facility] [address] [place], [place] [address] [phone]   or   www.GolfingFamily.no **Food pantry  [employer] [facility] [address] [place], [place] [address] [phone] **Food pantry  [facility] [address] [place], [place] [address] [phone]   or   www.greensborourbanministry.org  Tour manager and prepared meals  [facility] [address] [place], [address] [place], [place], [place] [address] VerifiedMovies.gl  **Food pantry  [employer] [address] [place], [place] [address] [phone]   or   www.lbcnow.org  **Food pantry  [employer] [address] [place], [place] [address] [phone]   or   WorkingMBA.co.nz **Food pantry, nutrition education, gardening activities  [facility] [address] [place], [place] [address] [phone] **Food pantry  [facility] [address], [place], [place] [address] [phone]   or   www.salvationarmyofgreensboro.[facility] [employer] [facility] [place] [address] [place], [place] [address] [phone]   or   http://senior-resources-[place]ford.org [employer] on Wheels Program  [facility] [address] [place], [place] [address] [phone]   or   www.stmattchurch.com  **Food pantry  [facility] [address] [place], [place] [address] [phone]   or    vandaliapresbyterianchurch.org **Food pantry

## 2020-01-20 LAB — CERVICOVAGINAL ANCILLARY ONLY
Chlamydia: NEGATIVE
Comment: NEGATIVE
Comment: NORMAL
Neisseria Gonorrhea: NEGATIVE

## 2020-01-23 ENCOUNTER — Other Ambulatory Visit: Payer: Self-pay

## 2020-01-23 ENCOUNTER — Other Ambulatory Visit: Payer: Self-pay | Admitting: *Deleted

## 2020-01-23 ENCOUNTER — Ambulatory Visit: Payer: Medicaid Other | Attending: Obstetrics and Gynecology

## 2020-01-23 ENCOUNTER — Ambulatory Visit: Payer: Medicaid Other | Admitting: *Deleted

## 2020-01-23 ENCOUNTER — Encounter: Payer: Self-pay | Admitting: *Deleted

## 2020-01-23 DIAGNOSIS — O99210 Obesity complicating pregnancy, unspecified trimester: Secondary | ICD-10-CM | POA: Diagnosis not present

## 2020-01-23 DIAGNOSIS — O09299 Supervision of pregnancy with other poor reproductive or obstetric history, unspecified trimester: Secondary | ICD-10-CM | POA: Diagnosis present

## 2020-01-23 DIAGNOSIS — A749 Chlamydial infection, unspecified: Secondary | ICD-10-CM | POA: Insufficient documentation

## 2020-01-23 DIAGNOSIS — O99843 Bariatric surgery status complicating pregnancy, third trimester: Secondary | ICD-10-CM | POA: Diagnosis not present

## 2020-01-23 DIAGNOSIS — O0990 Supervision of high risk pregnancy, unspecified, unspecified trimester: Secondary | ICD-10-CM | POA: Insufficient documentation

## 2020-01-23 DIAGNOSIS — Z9884 Bariatric surgery status: Secondary | ICD-10-CM

## 2020-01-23 DIAGNOSIS — O98812 Other maternal infectious and parasitic diseases complicating pregnancy, second trimester: Secondary | ICD-10-CM | POA: Insufficient documentation

## 2020-01-23 DIAGNOSIS — Z8619 Personal history of other infectious and parasitic diseases: Secondary | ICD-10-CM | POA: Diagnosis present

## 2020-01-23 DIAGNOSIS — O99213 Obesity complicating pregnancy, third trimester: Secondary | ICD-10-CM

## 2020-01-23 DIAGNOSIS — O3663X0 Maternal care for excessive fetal growth, third trimester, not applicable or unspecified: Secondary | ICD-10-CM

## 2020-01-23 DIAGNOSIS — Z3A32 32 weeks gestation of pregnancy: Secondary | ICD-10-CM

## 2020-01-23 DIAGNOSIS — O09293 Supervision of pregnancy with other poor reproductive or obstetric history, third trimester: Secondary | ICD-10-CM | POA: Diagnosis not present

## 2020-01-27 ENCOUNTER — Inpatient Hospital Stay (HOSPITAL_COMMUNITY)
Admission: AD | Admit: 2020-01-27 | Discharge: 2020-01-28 | Disposition: A | Discharge status: Home / Self Care | Payer: Medicaid Other | Attending: Obstetrics & Gynecology | Admitting: Obstetrics & Gynecology

## 2020-01-27 ENCOUNTER — Other Ambulatory Visit: Payer: Self-pay

## 2020-01-27 ENCOUNTER — Encounter (HOSPITAL_COMMUNITY): Payer: Self-pay | Admitting: Obstetrics & Gynecology

## 2020-01-27 DIAGNOSIS — D649 Anemia, unspecified: Secondary | ICD-10-CM | POA: Insufficient documentation

## 2020-01-27 DIAGNOSIS — Z6790 Unspecified blood type, Rh positive: Secondary | ICD-10-CM

## 2020-01-27 DIAGNOSIS — O99843 Bariatric surgery status complicating pregnancy, third trimester: Secondary | ICD-10-CM | POA: Diagnosis not present

## 2020-01-27 DIAGNOSIS — O98513 Other viral diseases complicating pregnancy, third trimester: Secondary | ICD-10-CM | POA: Insufficient documentation

## 2020-01-27 DIAGNOSIS — Z79899 Other long term (current) drug therapy: Secondary | ICD-10-CM | POA: Diagnosis not present

## 2020-01-27 DIAGNOSIS — Z8759 Personal history of other complications of pregnancy, childbirth and the puerperium: Secondary | ICD-10-CM | POA: Diagnosis not present

## 2020-01-27 DIAGNOSIS — O4693 Antepartum hemorrhage, unspecified, third trimester: Secondary | ICD-10-CM | POA: Diagnosis present

## 2020-01-27 DIAGNOSIS — Z3A33 33 weeks gestation of pregnancy: Secondary | ICD-10-CM | POA: Diagnosis not present

## 2020-01-27 DIAGNOSIS — O4690 Antepartum hemorrhage, unspecified, unspecified trimester: Secondary | ICD-10-CM

## 2020-01-27 DIAGNOSIS — Z3689 Encounter for other specified antenatal screening: Secondary | ICD-10-CM

## 2020-01-27 DIAGNOSIS — O99013 Anemia complicating pregnancy, third trimester: Secondary | ICD-10-CM | POA: Insufficient documentation

## 2020-01-27 NOTE — MAU Note (Signed)
Pt presents via EMS with reports of vaginal bleeding like a period after intercourse that started about an hour ago. She denies pain or contractions. Some pelvic pressure. Good fetal movement.

## 2020-01-28 DIAGNOSIS — O4693 Antepartum hemorrhage, unspecified, third trimester: Secondary | ICD-10-CM

## 2020-01-28 DIAGNOSIS — Z3A33 33 weeks gestation of pregnancy: Secondary | ICD-10-CM

## 2020-01-28 LAB — WET PREP, GENITAL
Sperm: NONE SEEN
Trich, Wet Prep: NONE SEEN
Yeast Wet Prep HPF POC: NONE SEEN

## 2020-01-28 NOTE — Discharge Instructions (Signed)
Vaginal Bleeding During Pregnancy, Third Trimester  A small amount of bleeding from the vagina (spotting) is relatively common during pregnancy. Various things can cause bleeding or spotting during pregnancy. Sometimes bleeding is normal and is not a problem. However, bleeding during the third trimester can also be a sign of something serious for the mother and the baby. Be sure to tell your health care provider about any vaginal bleeding right away. Some possible causes of vaginal bleeding during the third trimester include:  Infection or growths (polyps) on the cervix.  A condition in which the placenta partially or completely covers the opening of the cervix inside the uterus (placenta previa).  The placenta separating from the uterus (placenta abruption).  The start of labor (discharging of the mucus plug).  A condition in which the placenta grows into the muscle layer of the uterus (placenta accreta). Follow these instructions at home: Activity  Follow instructions from your health care provider about limiting your activity. If your health care provider recommends activity restriction, you may need to stay in bed and only get up to use the bathroom. In some cases, your health care provider may allow you to continue light activity.  If needed, make plans for someone to help with your regular activities.  Ask your health care provider if it is safe for you to drive.  Do not lift anything that is heavier than 10 lb (4.5 kg), or the limit that your health care provider tells you, until he or she says that it is safe.  Do not have sex or orgasms until your health care provider says that this is safe. Medicines  Take over-the-counter and prescription medicines only as told by your health care provider.  Do not take aspirin because it can cause bleeding. General instructions  Pay attention to any changes in your symptoms.  Write down how many pads you use each day, how often you  change pads, and how soaked (saturated) they are.  Do not use tampons or douche.  If you pass any tissue from your vagina, save the tissue so you can show it to your health care provider.  Keep all follow-up visits as told by your health care provider. This is important. Contact a health care provider if:  You have vaginal bleeding during any part of your pregnancy.  You have cramps or labor pains.  You have a fever. Get help right away if:  You have severe cramps or pain in your back or abdomen.  You have a gush of fluid from the vagina.  You pass large clots or a large amount of tissue from your vagina.  Your bleeding increases.  You feel light-headed or weak.  You faint.  You feel that your baby is moving less than usual, or not moving at all. Summary  Various things can cause bleeding or spotting in pregnancy.  Bleeding during the third trimester can be a sign of a serious problem for the mother and the baby.  Be sure to tell your health care provider about any vaginal bleeding right away. This information is not intended to replace advice given to you by your health care provider. Make sure you discuss any questions you have with your health care provider. Document Revised: 09/25/2018 Document Reviewed: 09/08/2016 Elsevier Patient Education  2020 [employer].        Preterm Labor and Birth Information  The normal length of a pregnancy is 39-41 weeks. Preterm labor is when labor starts before 37 completed  weeks of pregnancy. What are the risk factors for preterm labor? Preterm labor is more likely to occur in women who:  Have certain infections during pregnancy such as a bladder infection, sexually transmitted infection, or infection inside the uterus (chorioamnionitis).  Have a shorter-than-normal cervix.  Have gone into preterm labor before.  Have had surgery on their cervix.  Are younger than age 71 or older than age 62.  Are African  American.  Are pregnant with twins or multiple babies (multiple gestation).  Take street drugs or smoke while pregnant.  Do not gain enough weight while pregnant.  Became pregnant shortly after having been pregnant. What are the symptoms of preterm labor? Symptoms of preterm labor include:  Cramps similar to those that can happen during a menstrual period. The cramps may happen with diarrhea.  Pain in the abdomen or lower back.  Regular uterine contractions that may feel like tightening of the abdomen.  A feeling of increased pressure in the pelvis.  Increased watery or bloody mucus discharge from the vagina.  Water breaking (ruptured amniotic sac). Why is it important to recognize signs of preterm labor? It is important to recognize signs of preterm labor because babies who are born prematurely may not be fully developed. This can put them at an increased risk for:  Long-term (chronic) heart and lung problems.  Difficulty immediately after birth with regulating body systems, including blood sugar, body temperature, heart rate, and breathing rate.  Bleeding in the brain.  Cerebral palsy.  Learning difficulties.  Death. These risks are highest for babies who are born before 34 weeks of pregnancy. How is preterm labor treated? Treatment depends on the length of your pregnancy, your condition, and the health of your baby. It may involve:  Having a stitch (suture) placed in your cervix to prevent your cervix from opening too early (cerclage).  Taking or being given medicines, such as: ? Hormone medicines. These may be given early in pregnancy to help support the pregnancy. ? Medicine to stop contractions. ? Medicines to help mature the baby's lungs. These may be prescribed if the risk of delivery is high. ? Medicines to prevent your baby from developing cerebral palsy. If the labor happens before 34 weeks of pregnancy, you may need to stay in the hospital. What should I  do if I think I am in preterm labor? If you think that you are going into preterm labor, call your health care provider right away. How can I prevent preterm labor in future pregnancies? To increase your chance of having a full-term pregnancy:  Do not use any tobacco products, such as cigarettes, chewing tobacco, and e-cigarettes. If you need help quitting, ask your health care provider.  Do not use street drugs or medicines that have not been prescribed to you during your pregnancy.  Talk with your health care provider before taking any herbal supplements, even if you have been taking them regularly.  Make sure you gain a healthy amount of weight during your pregnancy.  Watch for infection. If you think that you might have an infection, get it checked right away.  Make sure to tell your health care provider if you have gone into preterm labor before. This information is not intended to replace advice given to you by your health care provider. Make sure you discuss any questions you have with your health care provider. Document Revised: 09/28/2018 Document Reviewed: 10/28/2015 Elsevier Patient Education  2020 [employer].

## 2020-01-28 NOTE — MAU Provider Note (Signed)
History     CSN: [id number]  Arrival date and time: 01/27/20 2219   First Provider Initiated Contact with Patient 01/27/20 2358      Chief Complaint  Patient presents with  . Vaginal Bleeding   Ms. Carrie Cardenas is a 29 y.o. G2P1001 at 48w0dwho presents to MAU for vaginal bleeding which began after sex today. Patient reports immediately after intercourse she and her partner noticed that she was bleeding and reports she had "a lot of bleeding" but reports it was as much as a period. Patient denies any bleeding since coming to MAU.  Passing blood clots? no Blood soaking clothes? no Lightheaded/dizzy? no Significant pelvic pain or cramping/ctx? no Passed any tissue? no Recent trauma? no Prior abruption? no Smoking/drug use? no HTN? Hx of preeclampsia PPROM? no Current pregnancy problems? Anemia, HSV, hx gastric bypass, hx preeclampsia, hx PPH Hx of C/S or GYN surgery? no  Blood Type? A Positive Allergies? NKDA Current medications? PNVs [facility]   OB History    Gravida  2   Para  1   Term  1   Preterm  0   AB  0   Living  1     SAB  0   TAB  0   Ectopic  0   Multiple  0   Live Births  1           Past Medical History:  Diagnosis Date  . Depression    pt hospitalized at 29years old for major depression  . Herpes genitalis in women   . Hypovolemic shock ([facility]   . Mallory-Weiss tear 04/07/2016  . Pregnancy induced hypertension   . Retained products of conception, following delivery with hemorrhage 04/05/2016  . Sepsis ([facility]   . Severe preeclampsia, third trimester 03/30/2016    Past Surgical History:  Procedure Laterality Date  . DILATION AND EVACUATION N/A 04/05/2016   Procedure: DILATATION AND EVACUATION;  Surgeon: UOsborne Oman MD;  [facility];  Service: Gynecology;  Laterality: N/A;  . ESOPHAGOGASTRODUODENOSCOPY N/A 04/07/2016   Procedure: ESOPHAGOGASTRODUODENOSCOPY (EGD);  Surgeon:  VWilford Corner MD;  [facility];  Service: Endoscopy;  Laterality: N/A;  . ESOPHAGOGASTRODUODENOSCOPY N/A 04/07/2016   Procedure: ESOPHAGOGASTRODUODENOSCOPY (EGD);  Surgeon: VWilford Corner MD;  [facility]  Service: Endoscopy;  Laterality: N/A;  . ESOPHAGOGASTRODUODENOSCOPY N/A 04/08/2016   Procedure: ESOPHAGOGASTRODUODENOSCOPY (EGD);  Surgeon: DAlphonsa Overall MD;  [facility]  Service: General;  Laterality: N/A;  . GASTRIC BYPASS    . SLEEVE GASTROPLASTY  2015    Family History  Problem Relation Age of Onset  . Fibroids Mother   . Anemia Mother   . Pulmonary embolism Mother   . Cancer Maternal Grandmother     Social History   Tobacco Use  . Smoking status: Never Smoker  . Smokeless tobacco: Never Used  Vaping Use  . Vaping Use: Never used  Substance Use Topics  . Alcohol use: No  . Drug use: No    Allergies: No Known Allergies  Medications Prior to Admission  Medication Sig Dispense Refill Last Dose  . Prenatal Vit-Fe Fumarate-FA (PREPLUS) 27-1 MG TABS Take 1 tablet by mouth daily.   01/27/2020 at Unknown time  . aspirin EC 81 MG tablet Take 1 tablet (81 mg total) by mouth daily. Take after 12 weeks for prevention of preeclampsia later in pregnancy (Patient not taking: Reported on 12/12/2019) 300 tablet 2   .  Blood Pressure Monitoring (BLOOD PRESSURE KIT) DEVI 1 Device by Does not apply route as needed. 1 each 0   . ferrous sulfate (FERROUSUL) 325 (65 FE) MG tablet Take 1 tablet (325 mg total) by mouth 2 (two) times daily. 60 tablet 1   . Misc. Devices (GOJJI WEIGHT SCALE) MISC 1 Device by Does not apply route daily. (Patient not taking: Reported on 01/17/2020) 1 each 0   . valACYclovir (VALTREX) 500 MG tablet Take 1 tablet (500 mg total) by mouth 2 (two) times daily. 60 tablet 5     Review of Systems  Constitutional: Negative for chills, diaphoresis, fatigue and fever.  Eyes: Negative for visual disturbance.  Respiratory: Negative for  shortness of breath.   Cardiovascular: Negative for chest pain.  Gastrointestinal: Negative for abdominal pain, constipation, diarrhea, nausea and vomiting.  Genitourinary: Positive for vaginal bleeding. Negative for dysuria, flank pain, frequency, pelvic pain, urgency and vaginal discharge.  Neurological: Negative for dizziness, weakness, light-headedness and headaches.   Physical Exam   Blood pressure 109/62, pulse (!) 105, temperature 98.1 F (36.7 C), temperature source Oral, resp. rate 18, last menstrual period 06/11/2019, SpO2 99 %, currently breastfeeding.  Patient Vitals for the past 24 hrs:  BP Temp Temp src Pulse Resp SpO2  01/27/20 2222 109/62 98.1 F (36.7 C) Oral (!) 105 18 99 %   Physical Exam Vitals and nursing note reviewed. Exam conducted with a chaperone present.  Constitutional:      General: She is not in acute distress.    Appearance: Normal appearance. She is not ill-appearing, toxic-appearing or diaphoretic.  HENT:     Head: Normocephalic and atraumatic.  Pulmonary:     Effort: Pulmonary effort is normal.  Abdominal:     Palpations: Abdomen is soft.  Genitourinary:    General: Normal vulva.     Labia:        Right: No rash, tenderness or lesion.        Left: No rash, tenderness or lesion.      Vagina: Bleeding (minute bright red bleeding present in vagina, no active bleeding noted at external os) present. No vaginal discharge, tenderness or lesions.     Cervix: No discharge, friability, lesion, erythema or cervical bleeding.  Skin:    General: Skin is warm and dry.  Neurological:     Mental Status: She is alert and oriented to person, place, and time.  Psychiatric:        Mood and Affect: Mood normal.        Behavior: Behavior normal.        Thought Content: Thought content normal.        Judgment: Judgment normal.    No results found for this or any previous visit (from the past 24 hour(s)).  [place] [facility] OB FOLLOW UP  Result Date:  01/23/2020 ----------------------------------------------------------------------  OBSTETRICS REPORT                       (Signed Final 01/23/2020 02:04 pm) ---------------------------------------------------------------------- Patient Info  ID #:       [id number]                          D.O.B.:  [birth year] (29 yrs)  Name:       Dutch Quint                Visit Date: 01/23/2020 09:41 am ---------------------------------------------------------------------- Performed By  Attending:  Johnell Comings MD         Ref. Address:      Faculty  Performed By:     Hubert Azure          [facility]                                                              [place] [facility]                                                              [facility]  Referred By:      Mora Bellman MD ---------------------------------------------------------------------- Orders  #  Description                           Code        Ordered By  1  [place] MFM OB FOLLOW UP                   31517.61    Peterson Ao ----------------------------------------------------------------------  #  Order #                     Accession #                Episode #  1  [id number]                   [id number]                 [id number] ---------------------------------------------------------------------- Indications  Obesity complicating pregnancy, third           O99.213  trimester  Pregnancy complicated by previous gastric       O99.843  bypass, antepartum, third trimester  [redacted] weeks gestation of pregnancy                 Z3A.32  Poor obstetric history: Previous                O09.299  preeclampsia / eclampsia/gestational HTN ---------------------------------------------------------------------- Vital Signs                                                 Height:        5'5" ---------------------------------------------------------------------- Fetal  Evaluation  Num Of Fetuses:          1  Fetal Heart  162  Rate(bpm):  Cardiac Activity:        Observed  Presentation:            Cephalic  Placenta:                Posterior  Amniotic Fluid  AFI FV:      Within normal limits  AFI Sum(cm)     %Tile       Largest Pocket(cm)  19.31           72          7.18  RUQ(cm)       RLQ(cm)       LUQ(cm)        LLQ(cm)  5.78          2.67          7.18           3.68 ---------------------------------------------------------------------- Biometry  BPD:      81.3  mm     G. Age:  32w 5d         53  %    CI:         72.75  %    70 - 86                                                          FL/HC:       20.4  %    19.1 - 21.3  HC:      303.1  mm     G. Age:  33w 5d         50  %    HC/AC:       0.94       0.96 - 1.17  AC:      322.9  mm     G. Age:  36w 1d       > 99  %    FL/BPD:      75.9  %    71 - 87  FL:       61.7  mm     G. Age:  32w 0d         29  %    FL/AC:       19.1  %    20 - 24  LV:          5  mm  Est. FW:    2440   g      5 lb 6 oz     95  %                     m ---------------------------------------------------------------------- OB History  Gravidity:    2         Term:   1  Living:       1 ---------------------------------------------------------------------- Gestational Age  LMP:           32w 2d        Date:  06/11/19                 EDD:    03/17/20  U/S Today:     33w 5d  EDD:    03/07/20  Best:          Milderd Meager 2d     Det. By:  LMP  (06/11/19)          EDD:    03/17/20 ---------------------------------------------------------------------- Anatomy  Cranium:               Appears normal         Aortic Arch:            Previously seen  Cavum:                 Appears normal         Ductal Arch:            Previously seen  Ventricles:            Appears normal         Diaphragm:              Appears normal  Choroid Plexus:        Previously seen        Stomach:                Appears normal,                                                                         left sided  Cerebellum:            Previously seen        Abdomen:                Appears normal  Posterior Fossa:       Previously seen        Abdominal Wall:         Previously seen  Nuchal Fold:           Not applicable (>66    Cord Vessels:           Previously seen                         wks GA)  Face:                  Orbits and profile     Kidneys:                Appear normal                         previously seen  Lips:                  Previously seen        Bladder:                Appears normal  Thoracic:              Appears normal         Spine:                  Previously seen  Heart:                 Previously seen        Upper Extremities:  Previously seen  RVOT:                  Previously seen        Lower Extremities:      Previously seen  LVOT:                  Appears normal  Other:  Hands and feet prev visualized. Heels appear normal. Open hands          visualized. Technically difficult due to maternal habitus and fetal          position. ---------------------------------------------------------------------- Comments  This patient was seen for a follow up growth scan due to  maternal obesity.  She has a history of prior gastric bypass  surgery.  The overall fetal growth measures large for her gestational  age (59th percentile).  There was normal amniotic fluid noted.  Follow-up as indicated. ----------------------------------------------------------------------                   Johnell Comings, MD Electronically Signed Final Report   01/23/2020 02:04 pm ----------------------------------------------------------------------   MAU Course  Procedures  MDM -VB immediately after intercourse -A Positive -minute bright red blood present in vagina on exam, no active bleeding from external os -cervix visually closed -EFM: reactive       -baseline: 140       -variability: moderate       -accels: present, 15x15       -decels: absent        -TOCO: irritability -WetPrep collected -GC/CT negative 10days ago, pt denies new partners in the past month -consulted with Dr. Jimmye Norman, pt OK to be discharged home -pt discharged to home in stable condition  Orders Placed This Encounter  Procedures  . Wet prep, genital    Standing Status:   Standing    Number of Occurrences:   1  . Discharge patient    Order Specific Question:   Discharge disposition    Answer:   01-Home or Self Care [1]    Order Specific Question:   Discharge patient date    Answer:   01/28/2020   No orders of the defined types were placed in this encounter.  Assessment and Plan   1. Vaginal bleeding during pregnancy   2. Blood type, Rh positive   3. [redacted] weeks gestation of pregnancy   4. NST (non-stress test) reactive     Allergies as of 01/28/2020   No Known Allergies     Medication List    TAKE these medications   aspirin EC 81 MG tablet Take 1 tablet (81 mg total) by mouth daily. Take after 12 weeks for prevention of preeclampsia later in pregnancy   Blood Pressure Kit Devi 1 Device by Does not apply route as needed.   ferrous sulfate 325 (65 FE) MG tablet Commonly known as: FerrouSul Take 1 tablet (325 mg total) by mouth 2 (two) times daily.   Gojji Weight Scale Misc 1 Device by Does not apply route daily.   PrePLUS 27-1 MG Tabs Take 1 tablet by mouth daily.   valACYclovir 500 MG tablet Commonly known as: Valtrex Take 1 tablet (500 mg total) by mouth 2 (two) times daily.       -will call with culture results, if positive -discussed bleeding after intercourse may be normal in pregnancy -return MAU/PTL precautions given -pt discharged to home in stable condition  Elmyra Ricks E Azalya Galyon 01/28/2020, 12:24 AM

## 2020-02-03 ENCOUNTER — Encounter: Payer: Self-pay | Admitting: *Deleted

## 2020-02-03 ENCOUNTER — Other Ambulatory Visit: Payer: Self-pay

## 2020-02-03 ENCOUNTER — Ambulatory Visit (INDEPENDENT_AMBULATORY_CARE_PROVIDER_SITE_OTHER): Payer: Medicaid Other | Admitting: Family Medicine

## 2020-02-03 VITALS — BP 117/77 | HR 84 | Wt 279.0 lb

## 2020-02-03 DIAGNOSIS — O0990 Supervision of high risk pregnancy, unspecified, unspecified trimester: Secondary | ICD-10-CM

## 2020-02-03 DIAGNOSIS — O99013 Anemia complicating pregnancy, third trimester: Secondary | ICD-10-CM

## 2020-02-03 DIAGNOSIS — D509 Iron deficiency anemia, unspecified: Secondary | ICD-10-CM

## 2020-02-03 DIAGNOSIS — O09299 Supervision of pregnancy with other poor reproductive or obstetric history, unspecified trimester: Secondary | ICD-10-CM

## 2020-02-03 DIAGNOSIS — Z9884 Bariatric surgery status: Secondary | ICD-10-CM

## 2020-02-03 LAB — CBC
Hematocrit: 32.7 % — ABNORMAL LOW (ref 34.0–46.6)
Hemoglobin: 10.6 g/dL — ABNORMAL LOW (ref 11.1–15.9)
MCH: 28.1 pg (ref 26.6–33.0)
MCHC: 32.4 g/dL (ref 31.5–35.7)
MCV: 87 fL (ref 79–97)
Platelets: 141 10*3/uL — ABNORMAL LOW (ref 150–450)
RBC: 3.77 x10E6/uL (ref 3.77–5.28)
RDW: 16.4 % — ABNORMAL HIGH (ref 11.7–15.4)
WBC: 3.5 10*3/uL (ref 3.4–10.8)

## 2020-02-03 NOTE — Progress Notes (Signed)
Patient states that she has not been taking her blood pressure at home because she was not aware that the cuff was supposed to be picked up. Instructed patient to pick up at [facility] where prescription was sent. Please notify us if there are any questions or issues.

## 2020-02-03 NOTE — Progress Notes (Signed)
    PRENATAL VISIT NOTE  Subjective:  Carrie Cardenas is a 29 y.o. G2P1001 at [redacted]w[redacted]d being seen today for ongoing prenatal care.  She is currently monitored for the following issues for this low-risk pregnancy and has Genital warts complicating pregnancy; LGSIL on Pap smear of cervix; History of gastric bypass; Acute hypoxemic respiratory failure (HCC); Supervision of high risk pregnancy, antepartum; History of pre-eclampsia in prior pregnancy, currently pregnant; History of postpartum hemorrhage, currently pregnant; History of herpes genitalis; Obesity affecting pregnancy; Chlamydia infection affecting pregnancy in second trimester, antepartum; and Maternal iron deficiency anemia complicating pregnancy in third trimester on their problem list.  Patient reports no complaints.  Contractions: Not present. Vag. Bleeding: None.  Movement: Present. Denies leaking of fluid.   The following portions of the patient's history were reviewed and updated as appropriate: allergies, current medications, past family history, past medical history, past social history, past surgical history and problem list.   Objective:   Vitals:   02/03/20 0838  BP: 117/77  Pulse: 84  Weight: 279 lb (126.6 kg)    Fetal Status: Fetal Heart Rate (bpm): 143 Fundal Height: 36 cm Movement: Present  Presentation: Vertex  General:  Alert, oriented and cooperative. Patient is in no acute distress.  Skin: Skin is warm and dry. No rash noted.   Cardiovascular: Normal heart rate noted  Respiratory: Normal respiratory effort, no problems with respiration noted  Abdomen: Soft, gravid, appropriate for gestational age.  Pain/Pressure: Present     Pelvic: Cervical exam deferred        Extremities: Normal range of motion.  Edema: None  Mental Status: Normal mood and affect. Normal behavior. Normal judgment and thought content.   Assessment and Plan:  Pregnancy: G2P1001 at [redacted]w[redacted]d 1. Supervision of high risk pregnancy,  antepartum Continue prenatal care.  - Hepatitis C antibody  2. History of pre-eclampsia in prior pregnancy, currently pregnant Normal BP today  3. History of gastric bypass   4. History of postpartum hemorrhage, currently pregnant With retained POC, followed by upper GI bleed and ventilatory support, strongly desires primary C-section. Considering BTL, papers today. Would want salpingectomy due to decreased risk of   5. Maternal iron deficiency anemia complicating pregnancy in third trimester Repeat labs - CBC  Preterm labor symptoms and general obstetric precautions including but not limited to vaginal bleeding, contractions, leaking of fluid and fetal movement were reviewed in detail with the patient. Please refer to After Visit Summary for other counseling recommendations.   Return in 2 weeks (on 02/17/2020) for [facility], in person.  Future Appointments  Date Time Provider Department Center  02/21/2020  9:45 AM [facility] NURSE [facility]  02/21/2020 10:00 AM [facility] US1 [facility]    Reva Bores, MD

## 2020-02-03 NOTE — Patient Instructions (Addendum)

## 2020-02-03 NOTE — Progress Notes (Signed)
[place] papers are signed. Copy will be made to give to patient.

## 2020-02-04 LAB — HEPATITIS C ANTIBODY: Hep C Virus Ab: <0.1 s/co ratio (ref 0.0–0.9)

## 2020-02-18 ENCOUNTER — Encounter: Payer: Medicaid Other | Admitting: Advanced Practice Midwife

## 2020-02-18 ENCOUNTER — Encounter: Payer: Self-pay | Admitting: Advanced Practice Midwife

## 2020-02-21 ENCOUNTER — Ambulatory Visit: Payer: Medicaid Other

## 2020-02-25 ENCOUNTER — Other Ambulatory Visit: Payer: Self-pay

## 2020-02-25 ENCOUNTER — Encounter: Payer: Self-pay | Admitting: Advanced Practice Midwife

## 2020-02-25 ENCOUNTER — Ambulatory Visit (INDEPENDENT_AMBULATORY_CARE_PROVIDER_SITE_OTHER): Payer: Medicaid Other | Admitting: Advanced Practice Midwife

## 2020-02-25 ENCOUNTER — Other Ambulatory Visit (HOSPITAL_COMMUNITY)
Admission: RE | Admit: 2020-02-25 | Discharge: 2020-02-25 | Disposition: A | Discharge status: Home / Self Care | Payer: Medicaid Other | Source: Ambulatory Visit | Attending: Advanced Practice Midwife | Admitting: Advanced Practice Midwife

## 2020-02-25 VITALS — BP 128/92 | HR 114 | Wt 281.6 lb

## 2020-02-25 DIAGNOSIS — O133 Gestational [pregnancy-induced] hypertension without significant proteinuria, third trimester: Secondary | ICD-10-CM

## 2020-02-25 DIAGNOSIS — O0990 Supervision of high risk pregnancy, unspecified, unspecified trimester: Secondary | ICD-10-CM

## 2020-02-25 DIAGNOSIS — Z3A37 37 weeks gestation of pregnancy: Secondary | ICD-10-CM

## 2020-02-25 LAB — POCT URINALYSIS DIP (DEVICE)
Glucose, UA: NEGATIVE mg/dL
Hgb urine dipstick: NEGATIVE
Ketones, ur: 15 mg/dL — AB
Nitrite: NEGATIVE
Protein, ur: NEGATIVE mg/dL
Specific Gravity, Urine: >=1.03 (ref 1.005–1.030)
Urobilinogen, UA: 0.2 mg/dL (ref 0.0–1.0)
pH: 6 (ref 5.0–8.0)

## 2020-02-25 NOTE — Progress Notes (Signed)
   PRENATAL VISIT NOTE  Subjective:  Carrie Cardenas is a 29 y.o. G2P1001 at [redacted]w[redacted]d being seen today for ongoing prenatal care.  She is currently monitored for the following issues for this high-risk pregnancy and has Genital warts complicating pregnancy; LGSIL on Pap smear of cervix; History of gastric bypass; Acute hypoxemic respiratory failure ([place]); Supervision of high risk pregnancy, antepartum; History of pre-eclampsia in prior pregnancy, currently pregnant; History of postpartum hemorrhage, currently pregnant; History of herpes genitalis; Obesity affecting pregnancy; Chlamydia infection affecting pregnancy in second trimester, antepartum; and Maternal iron deficiency anemia complicating pregnancy in third trimester on their problem list.  Patient reports no complaints.  Contractions: Not present. Vag. Bleeding: None.  Movement: Present. Denies leaking of fluid.   The following portions of the patient's history were reviewed and updated as appropriate: allergies, current medications, past family history, past medical history, past social history, past surgical history and problem list.   Objective:   Vitals:   02/25/20 1332  BP: (!) 128/92  Pulse: (!) 114  Weight: 281 lb 9.6 oz (127.7 kg)    Fetal Status: Fetal Heart Rate (bpm): 157 Fundal Height: 37 cm Movement: Present     General:  Alert, oriented and cooperative. Patient is in no acute distress.  Skin: Skin is warm and dry. No rash noted.   Cardiovascular: Normal heart rate noted  Respiratory: Normal respiratory effort, no problems with respiration noted  Abdomen: Soft, gravid, appropriate for gestational age.  Pain/Pressure: Present     Pelvic: Cervical exam performed in the presence of a chaperone Dilation: 1 Effacement (%): Thick Station: -3  Extremities: Normal range of motion.  Edema: None  Mental Status: Normal mood and affect. Normal behavior. Normal judgment and thought content.   Assessment and Plan:  Pregnancy:  G2P1001 at [redacted]w[redacted]d 1. Supervision of high risk pregnancy, antepartum - GC/Chlamydia probe amp ([place])not at [facility] - Culture, beta strep (group b only)  2. [redacted] weeks gestation of pregnancy - Covid vaccine recommended and [facility] handout given  3. Transient hypertension of pregnancy in third trimester w/ HX of pre-eclampsia with prior pregnancy  - CBC - Comp Met (CMET) - Protein / creatinine ratio, urine - BP check with RN on 9/9 or 9/10 - Pre-eclampsia warning signs reviewed   Term labor symptoms and general obstetric precautions including but not limited to vaginal bleeding, contractions, leaking of fluid and fetal movement were reviewed in detail with the patient. Please refer to After Visit Summary for other counseling recommendations.   Return in about 1 week (around 03/03/2020).  Future Appointments  Date Time Provider [place]  03/03/2020 11:15 AM [facility] NURSE [facility]  03/03/2020 11:30 AM [facility] US3 [facility]  03/10/2020  9:24 PM Arrie Senate, MD [facility]  03/17/2020  3:55 PM Ephraim Hamburger, Rona Ravens, NP [facility] DNP, CNM  02/25/20  2:02 PM

## 2020-02-25 NOTE — Patient Instructions (Signed)
COVID-19 Vaccine Information can be found at: [email or web address] For questions related to vaccine distribution or appointments, please email [email or web address] or call [phone].   COVID-19 Vaccination if You Are Pregnant or Breastfeeding  The Society for Maternal-Fetal Medicine [facility]) and other pregnancy experts recommend that pregnant and lactating people be vaccinated against COVID-19. The Centers for Disease Control and Prevention (CDC) also recommend vaccination for all people aged 28 years and older, including people who are pregnant, breastfeeding, trying to get pregnant now, or might become pregnant in the future. Vaccination is the best way to reduce the risks of COVID-19 infection and COVID-related complications for both you and your baby.  Three vaccines are available to prevent COVID-19:  The two-dose Pfizer vaccine for people 12 years and older--APPROVED by the [place] Food and Drug Administration on February 10, 2020  The two-dose Moderna vaccine for people 18 years and older--AUTHORIZED for emergency use  The one-dose Anheuser-[employer]ch vaccine for people 18 years and older (you may also see this vaccine referred to as the Linwood Dibbles vaccine)--AUTHORIZED for emergency use  For those receiving [employer] and [employer], the second dose is given 21 days [employer]) and 28 days (Moderna) after the first dose. The [employer]user-[employer]ch vaccine is only one dose.  Information for Pregnant Individuals If you are pregnant or planning to become pregnant and are thinking about getting vaccinated, consider talking with your health care professional about the vaccine.   To help with your decision, you should consider the following key points: Anyone can get the COVID vaccines free of charge regardless of immigration status or whether they have insurance. You may be asked for your social security number, but it is  NOT required to get  vaccinated.  What are benefits of getting the COVID-19 vaccines during pregnancy?   The vaccines can help protect you from getting COVID-19. With the two-dose vaccines, you must get both doses for maximum effectiveness. Its not yet known how long protection lasts.   Another potential benefit is that getting the vaccine while pregnant may help you pass antiCOVID-19 antibodies to your baby. In numerous studies of vaccinated moms, antibodies were found in the umbilical cord blood of babies and in the mothers breastmilk.   The CDC, along with other federal partners, are monitoring people who have been vaccinated for serious side effects. So far, more than 139,000 pregnant people have been vaccinated. No unexpected pregnancy or fetal problems have occurred. There have been no reports of any increased risk of pregnancy loss, growth problems, or birth defects.   A safe vaccine is generally considered one in which the benefits of being vaccinated outweigh the risks. The current vaccines are not live vaccines. There is only a very small  chance that they cross the placenta, so its unlikely that they even reach the fetus. Vaccines dont affect future fertility. The only people who should NOT get vaccinated are those who have had a severe allergic reaction to vaccines in the past or any vaccine ingredients.   Side effects may occur in the first 3 days after getting vaccinated.1 These include mild to moderate fever, headache, and muscle aches. Side effects may be worse after the second dose of the [employer] and Moderna vaccines. Fever should be avoided during pregnancy,especially in the first trimester. Those who develop a fever after vaccination can take acetaminophen (Tylenol). This medication is safe to use during pregnancy and does not  affect how the vaccine works.   What are the known risks  of getting COVID-19 during pregnancy?  About 1 to 3 per 1,000 pregnant women with COVID-19 will develop severe  disease. Compared with those who arent pregnant, pregnant people infected by the COVID-19 virus:  Are 3 times more likely to need ICU care  Are 2 to 3 times more likely to need advanced life support and a breathing tube   Have a small increased risk of dying due to COVID-19 They may also be at increased risk of stillbirth and preterm birth.  What is my risk of getting COVID-19?  Your risk of getting COVID-19 depends on the chance that you will come into contact with another infected person. The risk may be higher if you live in a community where there is a lot of COVID-19 infection or work in healthcare or another high-contact setting.   What is my risk for severe complications if I get COVID-19?  Data show that older pregnant women; those with preexisting health conditions, such as a body mass index higher than 35 kg/m2, diabetes, and heart disorders; and Black or Latinx women have an especially increased risk of severe disease and death from COVID-19.   If you still have questions about the vaccines or need more information, ask your health care provider or go to the Centers for Disease Control and Preventions COVID-19 vaccine webpage.   An Update on the An[employer]user-[employer]ch Vaccine   In April 2021, the FDA and CDC called for a brief pause to use of the An[employer]user-[employer]ch vaccine. They did so after reports of a severe side effect in a very small number of women younger than age 28 following vaccination. This side effect, called thrombosis with thrombocytopenia syndrome (TTS), causes blood clots (thrombosis) combined with low levels of platelets (thrombocytopenia).  TTS following the An[employer]user-[employer]ch vaccine is extremely rare. At the time of this update, it has occurred in only 7 people per 1 million Anheuser-[employer]ch shots given. According to the CDC, being on hormonal birth control (the pill, patch, or ring), pregnancy, breastfeeding, or being recently pregnant does not make you more likely to  develop TTS after getting the An[employer]user-[employer]ch vaccine. The pause was lifted on October 11, 2019, after the FDA and CDC determined that the known benefits of the An[employer]user-[employer]ch vaccine far outweigh the risks.   Health care professionals have been alerted to the possibility of this side effect in people who have received the [facility] vaccine. National organizations continue to recommend COVID-19 vaccination with any of the vaccines for pregnant women. All women younger than age 3 years, whether pregnant, breastfeeding, or not, should be aware of the very rare risk of TTS after getting the [employer]user-[employer]ch vaccine. The Pfizer and [employer] dont have this risk. If you get the Anheuser-[employer]ch vaccine,  seek medical help right away if you develop any of the following symptoms within 3 weeks of getting your shot:   Severe or persistent headaches or blurred vision  Shortness of breath  Chest pain  Leg swelling  Persistent abdominal pain  Easy bruising or tiny blood spots under the skin beyond the injection site  Experts continue to collect health and safety information from pregnant people who have been vaccinated. If you have questions about vaccination during pregnancy, visit the CDC website or talk to your health care professional. Information for Breastfeeding/Lactating Individuals The Society for Maternal-Fetal Medicine and other pregnancy experts recommend COVID-19 vaccination for people who are breastfeeding/lactating.  You dont have to delay or stop  breastfeeding just because you get vaccinated.   Getting Vaccinated  You can get vaccinated at any time during pregnancy. The CDC is committed to monitoring the vaccines safety for all individuals. Your health professional or vaccine clinic may give you information about enrolling in the v-safe after vaccination health checker (see the box below).Even after youre fully vaccinated, it is important to follow the CDCs  guidance for wearing a mask indoors in areas where there are substantial or high rates of COVID-19 infection.   What Happens When You Enroll in v-Safe?  The v-safe after vaccination health checker program lets the CDC check in with you after your vaccination. At sign-up, you can indicate that you are pregnant. Once you do that, expect the following:  Someone may call you from the v-safe program to ask initial questions and get more information.  You may be asked to enroll in the vaccine pregnancy registry, which is collecting information about any effects of the vaccine during pregnancy. This is a great way to help scientists monitor the vaccines safety and effectiveness.   References 1. [address], Voncille Lo M, Wallace M, Curran KG, Chamberland M, et al. The Advisory Committee on [employer] Interim Recommendation for Use of Pfizer-BioNTech  COVID-19 Vaccine -- [place], December 2020. MMWR Morbidity and Mortality Weekly Report 2020;69. 2. FDA Briefing Document. Janssen Ad26.COV2.S Vaccine for the Prevention of COVID-19. 2021. Accessed Aug 23, 2019; Available from: FemaleHumor.es 3. PFIZER-BIONTECH COVID-19 VACCINE [package insert] [place]: Pfizer and Violet, [place]: Biontech;2020. 4. FDA Briefing Document. Moderna COVID-19 Vaccine. 2020. Accessed 2020, Dec 18; Available from: DateTunes.nl  5. Bea Laura, Bordt EA, Atyeo C, Deriso E, Akinwunmi B, Young N, et al. COVID-19 vaccine response in pregnant and lactating women: a cohort study. Am J Obstet Gynecol 2021 Mar 24. 6. Panagiotakopoulos Elbert Ewings, Omer Jack, Lipkind HS, Caprice Renshaw DS, et al. SARS-CoV-2 Infection Among Hospitalized Pregnant Women: Reasons for Admission and Pregnancy  Characteristics - Eight [facility] Centers, March 1-Nov 17, 2018. MMWR Morb Baxter Flattery Rep 2020 Sep 23;69(38):1355-9. 7. Zambrano LD, Birdsong, Strid P, [place], Baldo Ash VT, et al. Update: Characteristics of Symptomatic Women of Reproductive Age with Laboratory-Confirmed SARSCoV-2 Infection by Pregnancy Status - [place] States, January 22-March 23, 2019. MMWR Morb Baxter Flattery Rep 2020 Nov 6;69(44):1641-7. 8. Delahoy MJ, Georgia Duff, Weston Settle PD, Blima Ledger, et al. Characteristics and Maternal and Birth Outcomes of Hospitalized Pregnant Women with Laboratory-Confirmed COVID-19 - COVID-NET, 71 States, March 1-February 09, 2019. MMWR Morb Baxter Flattery Rep 2020 Sep 25;69(38):1347-54.

## 2020-02-26 LAB — COMPREHENSIVE METABOLIC PANEL
ALT: 53 IU/L — ABNORMAL HIGH (ref 0–32)
AST: 43 IU/L — ABNORMAL HIGH (ref 0–40)
Albumin/Globulin Ratio: 1.2 (ref 1.2–2.2)
Albumin: 3.4 g/dL — ABNORMAL LOW (ref 3.9–5.0)
Alkaline Phosphatase: 180 IU/L — ABNORMAL HIGH (ref 48–121)
BUN/Creatinine Ratio: 12 (ref 9–23)
BUN: 7 mg/dL (ref 6–20)
Bilirubin Total: 0.5 mg/dL (ref 0.0–1.2)
CO2: 18 mmol/L — ABNORMAL LOW (ref 20–29)
Calcium: 9.2 mg/dL (ref 8.7–10.2)
Chloride: 106 mmol/L (ref 96–106)
Creatinine, Ser: 0.57 mg/dL (ref 0.57–1.00)
GFR calc Af Amer: 146 mL/min/{1.73_m2} (ref >59)
GFR calc non Af Amer: 127 mL/min/{1.73_m2} (ref >59)
Globulin, Total: 2.9 g/dL (ref 1.5–4.5)
Glucose: 77 mg/dL (ref 65–99)
Potassium: 3.6 mmol/L (ref 3.5–5.2)
Sodium: 137 mmol/L (ref 134–144)
Total Protein: 6.3 g/dL (ref 6.0–8.5)

## 2020-02-26 LAB — PROTEIN / CREATININE RATIO, URINE
Creatinine, Urine: 325.5 mg/dL
Protein, Ur: 26.8 mg/dL
Protein/Creat Ratio: 82 mg/g creat (ref 0–200)

## 2020-02-26 LAB — CBC
Hematocrit: 32.5 % — ABNORMAL LOW (ref 34.0–46.6)
Hemoglobin: 10.9 g/dL — ABNORMAL LOW (ref 11.1–15.9)
MCH: 28.8 pg (ref 26.6–33.0)
MCHC: 33.5 g/dL (ref 31.5–35.7)
MCV: 86 fL (ref 79–97)
Platelets: 143 10*3/uL — ABNORMAL LOW (ref 150–450)
RBC: 3.79 x10E6/uL (ref 3.77–5.28)
RDW: 16.1 % — ABNORMAL HIGH (ref 11.7–15.4)
WBC: 4.7 10*3/uL (ref 3.4–10.8)

## 2020-02-26 LAB — GC/CHLAMYDIA PROBE AMP (~~LOC~~) NOT AT ARMC
Chlamydia: NEGATIVE
Comment: NEGATIVE
Comment: NORMAL
Neisseria Gonorrhea: NEGATIVE

## 2020-02-27 ENCOUNTER — Ambulatory Visit: Payer: Medicaid Other

## 2020-02-28 ENCOUNTER — Other Ambulatory Visit: Payer: Self-pay | Admitting: Family Medicine

## 2020-02-28 LAB — CULTURE, BETA STREP (GROUP B ONLY): Strep Gp B Culture: POSITIVE — AB

## 2020-03-03 ENCOUNTER — Encounter (HOSPITAL_COMMUNITY): Payer: Self-pay | Admitting: Obstetrics & Gynecology

## 2020-03-03 ENCOUNTER — Ambulatory Visit: Payer: Medicaid Other | Admitting: *Deleted

## 2020-03-03 ENCOUNTER — Encounter: Payer: Medicaid Other | Admitting: Certified Nurse Midwife

## 2020-03-03 ENCOUNTER — Other Ambulatory Visit: Payer: Self-pay | Admitting: Advanced Practice Midwife

## 2020-03-03 ENCOUNTER — Other Ambulatory Visit: Payer: Self-pay

## 2020-03-03 ENCOUNTER — Encounter: Payer: Self-pay | Admitting: *Deleted

## 2020-03-03 ENCOUNTER — Ambulatory Visit (HOSPITAL_BASED_OUTPATIENT_CLINIC_OR_DEPARTMENT_OTHER): Payer: Medicaid Other

## 2020-03-03 ENCOUNTER — Inpatient Hospital Stay (EMERGENCY_DEPARTMENT_HOSPITAL)
Admission: AD | Admit: 2020-03-03 | Discharge: 2020-03-03 | Disposition: A | Discharge status: Home / Self Care | Payer: Medicaid Other | Source: Home / Self Care | Attending: Obstetrics & Gynecology | Admitting: Obstetrics & Gynecology

## 2020-03-03 DIAGNOSIS — O98812 Other maternal infectious and parasitic diseases complicating pregnancy, second trimester: Secondary | ICD-10-CM | POA: Insufficient documentation

## 2020-03-03 DIAGNOSIS — Z8619 Personal history of other infectious and parasitic diseases: Secondary | ICD-10-CM | POA: Insufficient documentation

## 2020-03-03 DIAGNOSIS — O09299 Supervision of pregnancy with other poor reproductive or obstetric history, unspecified trimester: Secondary | ICD-10-CM

## 2020-03-03 DIAGNOSIS — Z3A38 38 weeks gestation of pregnancy: Secondary | ICD-10-CM

## 2020-03-03 DIAGNOSIS — Z20822 Contact with and (suspected) exposure to covid-19: Secondary | ICD-10-CM | POA: Insufficient documentation

## 2020-03-03 DIAGNOSIS — Z01812 Encounter for preprocedural laboratory examination: Secondary | ICD-10-CM | POA: Insufficient documentation

## 2020-03-03 DIAGNOSIS — O1493 Unspecified pre-eclampsia, third trimester: Secondary | ICD-10-CM

## 2020-03-03 DIAGNOSIS — O99843 Bariatric surgery status complicating pregnancy, third trimester: Secondary | ICD-10-CM | POA: Insufficient documentation

## 2020-03-03 DIAGNOSIS — Z362 Encounter for other antenatal screening follow-up: Secondary | ICD-10-CM

## 2020-03-03 DIAGNOSIS — O99213 Obesity complicating pregnancy, third trimester: Secondary | ICD-10-CM | POA: Diagnosis not present

## 2020-03-03 DIAGNOSIS — Z9884 Bariatric surgery status: Secondary | ICD-10-CM | POA: Insufficient documentation

## 2020-03-03 DIAGNOSIS — O09293 Supervision of pregnancy with other poor reproductive or obstetric history, third trimester: Secondary | ICD-10-CM | POA: Diagnosis not present

## 2020-03-03 DIAGNOSIS — O0990 Supervision of high risk pregnancy, unspecified, unspecified trimester: Secondary | ICD-10-CM

## 2020-03-03 DIAGNOSIS — O1403 Mild to moderate pre-eclampsia, third trimester: Secondary | ICD-10-CM | POA: Insufficient documentation

## 2020-03-03 DIAGNOSIS — A749 Chlamydial infection, unspecified: Secondary | ICD-10-CM | POA: Insufficient documentation

## 2020-03-03 LAB — RAPID HIV SCREEN (HIV 1/2 AB+AG)
HIV 1/2 Antibodies: NONREACTIVE
HIV-1 P24 Antigen - HIV24: NONREACTIVE

## 2020-03-03 LAB — COMPREHENSIVE METABOLIC PANEL
ALT: 58 U/L — ABNORMAL HIGH (ref 0–44)
AST: 50 U/L — ABNORMAL HIGH (ref 15–41)
Albumin: 3.1 g/dL — ABNORMAL LOW (ref 3.5–5.0)
Alkaline Phosphatase: 179 U/L — ABNORMAL HIGH (ref 38–126)
Anion gap: 11 (ref 5–15)
BUN: 7 mg/dL (ref 6–20)
CO2: 19 mmol/L — ABNORMAL LOW (ref 22–32)
Calcium: 9.5 mg/dL (ref 8.9–10.3)
Chloride: 108 mmol/L (ref 98–111)
Creatinine, Ser: 0.58 mg/dL (ref 0.44–1.00)
GFR calc Af Amer: >60 mL/min (ref >60)
GFR calc non Af Amer: >60 mL/min (ref >60)
Glucose, Bld: 80 mg/dL (ref 70–99)
Potassium: 3.5 mmol/L (ref 3.5–5.1)
Sodium: 138 mmol/L (ref 135–145)
Total Bilirubin: 0.7 mg/dL (ref 0.3–1.2)
Total Protein: 6.3 g/dL — ABNORMAL LOW (ref 6.5–8.1)

## 2020-03-03 LAB — URINALYSIS, ROUTINE W REFLEX MICROSCOPIC
Glucose, UA: NEGATIVE mg/dL
Hgb urine dipstick: NEGATIVE
Ketones, ur: NEGATIVE mg/dL
Nitrite: NEGATIVE
Protein, ur: 30 mg/dL — AB
Specific Gravity, Urine: 1.026 (ref 1.005–1.030)
pH: 5 (ref 5.0–8.0)

## 2020-03-03 LAB — PROTEIN / CREATININE RATIO, URINE
Creatinine, Urine: 457.78 mg/dL
Protein Creatinine Ratio: 0.05 mg/mg{Cre} (ref 0.00–0.15)
Total Protein, Urine: 21 mg/dL

## 2020-03-03 LAB — CBC
HCT: 31.1 % — ABNORMAL LOW (ref 36.0–46.0)
Hemoglobin: 10.4 g/dL — ABNORMAL LOW (ref 12.0–15.0)
MCH: 28.3 pg (ref 26.0–34.0)
MCHC: 33.4 g/dL (ref 30.0–36.0)
MCV: 84.7 fL (ref 80.0–100.0)
Platelets: 141 10*3/uL — ABNORMAL LOW (ref 150–400)
RBC: 3.67 MIL/uL — ABNORMAL LOW (ref 3.87–5.11)
RDW: 16.1 % — ABNORMAL HIGH (ref 11.5–15.5)
WBC: 4.3 10*3/uL (ref 4.0–10.5)
nRBC: 0 % (ref 0.0–0.2)

## 2020-03-03 LAB — HEPATITIS PANEL, ACUTE
HCV Ab: NONREACTIVE
Hep A IgM: NONREACTIVE
Hep B C IgM: NONREACTIVE
Hepatitis B Surface Ag: NONREACTIVE

## 2020-03-03 LAB — SARS CORONAVIRUS 2 BY RT PCR (HOSPITAL ORDER, PERFORMED IN ~~LOC~~ HOSPITAL LAB): SARS Coronavirus 2: NEGATIVE

## 2020-03-03 NOTE — MAU Note (Signed)
Instructed to come to MAU for BP evaluation and blood work.  Denies H/A, visual disturbances, an epigastric pain.  Denies VB or LOF.  Endorses +FM.

## 2020-03-03 NOTE — Discharge Instructions (Signed)

## 2020-03-03 NOTE — MAU Note (Signed)
preop instructions given and reviewed with patient and chg soap provided. Pt verbalizes understanding

## 2020-03-03 NOTE — MAU Provider Note (Addendum)
History     CSN: [id number]  Arrival date and time: 03/03/20 1513   First Provider Initiated Contact with Patient 03/03/20 1609      Chief Complaint  Patient presents with  . BP Evaluation   Carrie Cardenas is a 29 y.o. G2P1001 at 80w0dwho presents today for pre-eclampsia evaluation. Patient was seen in the office last week, and had one BP that was 128/92. Patient denied any symptoms that day, but she does have a hx of severe pre-eclampsia with her prior pregnancy. Blood work was done and the plan was for the patient to return in 2-3 days for a BP check. Patient did not show up for BP check appt. She was seen in MFM today with normal BP, but on review labs from last week showed slightly elevated liver enzymes and low platelets. Patient was notified and asked to come in for repeat lab work. She denies any HA today. She has had off and on headaches that resolve without any treatment. She denies any visual disturbance or RUQ pain. She reports normal fetal movement. Patient denies any contractions, VB or LOF.    OB History    Gravida  2   Para  1   Term  1   Preterm  0   AB  0   Living  1     SAB  0   TAB  0   Ectopic  0   Multiple  0   Live Births  1           Past Medical History:  Diagnosis Date  . Depression    pt hospitalized at 29years old for major depression  . Herpes genitalis in women   . [place]   . Mallory-Weiss tear 04/07/2016  . Pregnancy induced hypertension   . Retained products of conception, following delivery with hemorrhage 04/05/2016  . [place]is [place]   . Severe preeclampsia, third trimester 03/30/2016    Past Surgical History:  Procedure Laterality Date  . DILATION AND EVACUATION N/A 04/05/2016   Procedure: DILATATION AND EVACUATION;  Surgeon: UOsborne Oman MD;  [facility];  Service: Gynecology;  Laterality: N/A;  . ESOPHAGOGASTRODUODENOSCOPY N/A 04/07/2016   Procedure: ESOPHAGOGASTRODUODENOSCOPY (EGD);   Surgeon: VWilford Corner MD;  [facility];  Service: Endoscopy;  Laterality: N/A;  . ESOPHAGOGASTRODUODENOSCOPY N/A 04/07/2016   Procedure: ESOPHAGOGASTRODUODENOSCOPY (EGD);  Surgeon: VWilford Corner MD;  [facility]  Service: Endoscopy;  Laterality: N/A;  . ESOPHAGOGASTRODUODENOSCOPY N/A 04/08/2016   Procedure: ESOPHAGOGASTRODUODENOSCOPY (EGD);  Surgeon: DAlphonsa Overall MD;  [facility]  Service: General;  Laterality: N/A;  . GASTRIC BYPASS    . SLEEVE GASTROPLASTY  2015    Family History  Problem Relation Age of Onset  . Fibroids Mother   . Anemia Mother   . Pulmonary embolism Mother   . Cancer Maternal Grandmother     Social History   Tobacco Use  . Smoking status: Never Smoker  . Smokeless tobacco: Never Used  Vaping Use  . Vaping Use: Never used  Substance Use Topics  . Alcohol use: No  . Drug use: No    Allergies: No Known Allergies  Medications Prior to Admission  Medication Sig Dispense Refill Last Dose  . Blood Pressure Monitoring (BLOOD PRESSURE KIT) DEVI 1 Device by Does not apply route as needed. (Patient not taking: Reported on 02/03/2020) 1 each 0   . ferrous sulfate (FERROUSUL) 325 (65 FE) MG tablet Take 1 tablet (325 mg  total) by mouth 2 (two) times daily. 60 tablet 1   . Misc. Devices (GOJJI WEIGHT SCALE) MISC 1 Device by Does not apply route daily. (Patient not taking: Reported on 01/17/2020) 1 each 0   . Prenatal Vit-Fe Fumarate-FA (PREPLUS) 27-1 MG TABS Take 1 tablet by mouth daily.     . valACYclovir (VALTREX) 500 MG tablet Take 1 tablet (500 mg total) by mouth 2 (two) times daily. 60 tablet 5     Review of Systems  All other systems reviewed and are negative.  Physical Exam   Blood pressure 137/84, pulse 82, temperature 98.6 F (37 C), temperature source Oral, resp. rate 20, height 5' 5" (1.651 m), weight 131.9 kg, last menstrual period 06/11/2019, SpO2 100 %, currently breastfeeding.  Physical Exam Vitals and nursing  note reviewed.  Constitutional:      General: She is not in acute distress. HENT:     Head: Normocephalic.  Cardiovascular:     Rate and Rhythm: Normal rate.  Pulmonary:     Effort: Pulmonary effort is normal.  Abdominal:     General: Bowel sounds are normal.     Palpations: Abdomen is soft.     Tenderness: There is no abdominal tenderness (RUQ).  Musculoskeletal:     Right lower leg: No edema.     Left lower leg: No edema.  Skin:    General: Skin is warm and dry.  Neurological:     Mental Status: She is alert and oriented to person, place, and time.  Psychiatric:        Mood and Affect: Mood normal.        Behavior: Behavior normal.    NST:  Baseline: 120 Variability: moderate Accels: 15x15 Decels: none Toco: none Reactive  Results for orders placed or performed during the hospital encounter of 03/03/20 (from the past 24 hour(s))  CBC     Status: Abnormal   Collection Time: 03/03/20  3:42 PM  Result Value Ref Range   WBC 4.3 4.0 - 10.5 K/uL   RBC 3.67 (L) 3.87 - 5.11 MIL/uL   Hemoglobin 10.4 (L) 12.0 - 15.0 g/dL   HCT 31.1 (L) 36 - 46 %   MCV 84.7 80.0 - 100.0 fL   MCH 28.3 26.0 - 34.0 pg   MCHC 33.4 30.0 - 36.0 g/dL   RDW 16.1 (H) 11.5 - 15.5 %   Platelets 141 (L) 150 - 400 K/uL   nRBC 0.0 0.0 - 0.2 %  Comprehensive metabolic panel     Status: Abnormal   Collection Time: 03/03/20  3:42 PM  Result Value Ref Range   Sodium 138 135 - 145 mmol/L   Potassium 3.5 3.5 - 5.1 mmol/L   Chloride 108 98 - 111 mmol/L   CO2 19 (L) 22 - 32 mmol/L   Glucose, Bld 80 70 - 99 mg/dL   BUN 7 6 - 20 mg/dL   Creatinine, Ser 0.58 0.44 - 1.00 mg/dL   Calcium 9.5 8.9 - 10.3 mg/dL   Total Protein 6.3 (L) 6.5 - 8.1 g/dL   Albumin 3.1 (L) 3.5 - 5.0 g/dL   AST 50 (H) 15 - 41 U/L   ALT 58 (H) 0 - 44 U/L   Alkaline Phosphatase 179 (H) 38 - 126 U/L   Total Bilirubin 0.7 0.3 - 1.2 mg/dL   GFR calc non Af Amer >60 >60 mL/min   GFR calc Af Amer >60 >60 mL/min   Anion gap 11 5 - 15  Protein / creatinine ratio, urine     Status: None   Collection Time: 03/03/20  3:54 PM  Result Value Ref Range   Creatinine, Urine 457.78 mg/dL   Total Protein, Urine 21 mg/dL   Protein Creatinine Ratio 0.05 0.00 - 0.15 mg/mg[Cre]  Urinalysis, Routine w reflex microscopic Urine, Clean Catch     Status: Abnormal   Collection Time: 03/03/20  3:54 PM  Result Value Ref Range   Color, Urine AMBER (A) YELLOW   APPearance HAZY (A) CLEAR   Specific Gravity, Urine 1.026 1.005 - 1.030   pH 5.0 5.0 - 8.0   Glucose, UA NEGATIVE NEGATIVE mg/dL   Hgb urine dipstick NEGATIVE NEGATIVE   Bilirubin Urine SMALL (A) NEGATIVE   Ketones, ur NEGATIVE NEGATIVE mg/dL   Protein, ur 30 (A) NEGATIVE mg/dL   Nitrite NEGATIVE NEGATIVE   Leukocytes,Ua MODERATE (A) NEGATIVE   RBC / HPF 0-5 0 - 5 RBC/hpf   WBC, UA 11-20 0 - 5 WBC/hpf   Bacteria, UA FEW (A) NONE SEEN   Squamous Epithelial / LPF 0-5 0 - 5   Mucus PRESENT    Ca Oxalate Crys, UA PRESENT        MAU Course  Procedures  MDM 6:25 PM D/W Dr. Harolyn Rutherford will put patient on the schedule for c-section tomorrow morning at 9:00am. Patient to get pre-op labs and covid test now, and then will return tomorrow morning at 7:30.    Assessment and Plan    1. Pre-eclampsia without severe features 2. [redacted]w[redacted]d  Pre-op labs and Covid test today Pre-op instructions, NPO after MN  Patient to return here tomorrow morning at 7:30am for c-section scheduled at [place] CNM  03/03/20  6:50 PM    Attestation of Attending Supervision of Advanced Practice Provider (PA/CNM/NP): Evaluation and management procedures were performed by the Advanced Practice Provider under my supervision and collaboration.  I have reviewed the Advanced Practice Provider's note and chart, and I agree with the management and plan. I have also made any necessary editorial changes.   Delivery indicated for preeclampsia at 347w0dPatient desires cesarean section given  history of traumatic birth.  She will be added on for tomorrow morning. Category 1 FHR tracing. No severe features. Discharged home with preoperative instructions, preoperative labs drawn prior to discharge. FM, severe PEC and labor precautions reviewed with patient.    UGVerita SchneidersMD, FARio Communitiesttending ObHohenwaldFa[facility] LPor WoDean Foods CompanyCoGlen[place]

## 2020-03-03 NOTE — Progress Notes (Signed)
Patient was seen last week in the office with 120/90 BP. She has a hx of pre-eclampsia. At that time blood work was drawn, and the plan was for patient to return for BP check later that week. She did not come to her BP check appt. Labs show slightly elevated liver enzymes and low platelets. Concern for HELLP syndrome. Patient called and verification with 2 identifiers. Advised that we would like for her to come to MAU for repeat blood work and BP check.  Patient verbalizes understanding and will come to MAU to be seen.   Thressa Sheller DNP, CNM  03/03/20  2:53 PM

## 2020-03-04 ENCOUNTER — Inpatient Hospital Stay (HOSPITAL_COMMUNITY): Payer: Medicaid Other | Admitting: Certified Registered Nurse Anesthetist

## 2020-03-04 ENCOUNTER — Other Ambulatory Visit: Payer: Self-pay

## 2020-03-04 ENCOUNTER — Encounter (HOSPITAL_COMMUNITY): Payer: Self-pay | Admitting: Obstetrics and Gynecology

## 2020-03-04 ENCOUNTER — Encounter (HOSPITAL_COMMUNITY)
Admission: AD | Disposition: A | Discharge status: Home / Self Care | Payer: Self-pay | Source: Home / Self Care | Attending: Obstetrics and Gynecology

## 2020-03-04 ENCOUNTER — Inpatient Hospital Stay (HOSPITAL_COMMUNITY)
Admission: AD | Admit: 2020-03-04 | Discharge: 2020-03-07 | DRG: 784 | Disposition: A | Discharge status: Home / Self Care | Payer: Medicaid Other | Attending: Obstetrics and Gynecology | Admitting: Obstetrics and Gynecology

## 2020-03-04 DIAGNOSIS — Z3A38 38 weeks gestation of pregnancy: Secondary | ICD-10-CM

## 2020-03-04 DIAGNOSIS — Z20822 Contact with and (suspected) exposure to covid-19: Secondary | ICD-10-CM | POA: Diagnosis present

## 2020-03-04 DIAGNOSIS — O1404 Mild to moderate pre-eclampsia, complicating childbirth: Secondary | ICD-10-CM | POA: Diagnosis present

## 2020-03-04 DIAGNOSIS — O26893 Other specified pregnancy related conditions, third trimester: Secondary | ICD-10-CM | POA: Diagnosis present

## 2020-03-04 DIAGNOSIS — O1205 Gestational edema, complicating the puerperium: Secondary | ICD-10-CM | POA: Diagnosis present

## 2020-03-04 DIAGNOSIS — O99844 Bariatric surgery status complicating childbirth: Secondary | ICD-10-CM | POA: Diagnosis present

## 2020-03-04 DIAGNOSIS — O99824 Streptococcus B carrier state complicating childbirth: Secondary | ICD-10-CM | POA: Diagnosis present

## 2020-03-04 DIAGNOSIS — O0990 Supervision of high risk pregnancy, unspecified, unspecified trimester: Secondary | ICD-10-CM

## 2020-03-04 DIAGNOSIS — O99210 Obesity complicating pregnancy, unspecified trimester: Secondary | ICD-10-CM | POA: Diagnosis present

## 2020-03-04 DIAGNOSIS — O9081 Anemia of the puerperium: Secondary | ICD-10-CM | POA: Diagnosis not present

## 2020-03-04 DIAGNOSIS — Z302 Encounter for sterilization: Secondary | ICD-10-CM

## 2020-03-04 DIAGNOSIS — M7989 Other specified soft tissue disorders: Secondary | ICD-10-CM | POA: Diagnosis not present

## 2020-03-04 DIAGNOSIS — O99214 Obesity complicating childbirth: Secondary | ICD-10-CM | POA: Diagnosis present

## 2020-03-04 DIAGNOSIS — D62 Acute posthemorrhagic anemia: Secondary | ICD-10-CM | POA: Diagnosis not present

## 2020-03-04 DIAGNOSIS — O1490 Unspecified pre-eclampsia, unspecified trimester: Secondary | ICD-10-CM

## 2020-03-04 DIAGNOSIS — O1493 Unspecified pre-eclampsia, third trimester: Secondary | ICD-10-CM | POA: Diagnosis not present

## 2020-03-04 LAB — COMPREHENSIVE METABOLIC PANEL
ALT: 44 U/L (ref 0–44)
AST: 40 U/L (ref 15–41)
Albumin: 2.3 g/dL — ABNORMAL LOW (ref 3.5–5.0)
Alkaline Phosphatase: 137 U/L — ABNORMAL HIGH (ref 38–126)
Anion gap: 8 (ref 5–15)
BUN: 8 mg/dL (ref 6–20)
CO2: 21 mmol/L — ABNORMAL LOW (ref 22–32)
Calcium: 8.5 mg/dL — ABNORMAL LOW (ref 8.9–10.3)
Chloride: 109 mmol/L (ref 98–111)
Creatinine, Ser: 0.59 mg/dL (ref 0.44–1.00)
GFR calc Af Amer: >60 mL/min (ref >60)
GFR calc non Af Amer: >60 mL/min (ref >60)
Glucose, Bld: 83 mg/dL (ref 70–99)
Potassium: 3.5 mmol/L (ref 3.5–5.1)
Sodium: 138 mmol/L (ref 135–145)
Total Bilirubin: 0.7 mg/dL (ref 0.3–1.2)
Total Protein: 5 g/dL — ABNORMAL LOW (ref 6.5–8.1)

## 2020-03-04 LAB — CBC
HCT: 25 % — ABNORMAL LOW (ref 36.0–46.0)
Hemoglobin: 8.3 g/dL — ABNORMAL LOW (ref 12.0–15.0)
MCH: 28.6 pg (ref 26.0–34.0)
MCHC: 33.2 g/dL (ref 30.0–36.0)
MCV: 86.2 fL (ref 80.0–100.0)
Platelets: 122 10*3/uL — ABNORMAL LOW (ref 150–400)
RBC: 2.9 MIL/uL — ABNORMAL LOW (ref 3.87–5.11)
RDW: 16.1 % — ABNORMAL HIGH (ref 11.5–15.5)
WBC: 4.2 10*3/uL (ref 4.0–10.5)
nRBC: 0 % (ref 0.0–0.2)

## 2020-03-04 LAB — RPR: RPR Ser Ql: NONREACTIVE

## 2020-03-04 LAB — FIBRINOGEN: Fibrinogen: 324 mg/dL (ref 210–475)

## 2020-03-04 LAB — PREPARE RBC (CROSSMATCH)

## 2020-03-04 SURGERY — Surgical Case
Anesthesia: Spinal

## 2020-03-04 MED ORDER — ONDANSETRON HCL 4 MG/2ML IJ SOLN
INTRAMUSCULAR | Status: AC
  Filled 2020-03-04: qty 2

## 2020-03-04 MED ORDER — BUPIVACAINE IN DEXTROSE 0.75-8.25 % IT SOLN
INTRATHECAL | Status: DC | PRN
  Administered 2020-03-04: 1.6 mL via INTRATHECAL

## 2020-03-04 MED ORDER — MORPHINE SULFATE (PF) 0.5 MG/ML IJ SOLN
INTRAMUSCULAR | Status: AC
  Filled 2020-03-04: qty 10

## 2020-03-04 MED ORDER — COCONUT OIL OIL: 1.0000 "application " | TOPICAL_OIL | Status: DC | PRN

## 2020-03-04 MED ORDER — NALBUPHINE HCL 10 MG/ML IJ SOLN: 5.0000 mg | INTRAMUSCULAR | Status: DC | PRN

## 2020-03-04 MED ORDER — MENTHOL 3 MG MT LOZG: 1.0000 | LOZENGE | OROMUCOSAL | Status: DC | PRN

## 2020-03-04 MED ORDER — SODIUM CHLORIDE 0.9% IV SOLUTION: Freq: Once | INTRAVENOUS | Status: DC

## 2020-03-04 MED ORDER — LACTATED RINGERS IV SOLN
125.0000 mL/h | INTRAVENOUS | Status: DC
  Administered 2020-03-04 (×2): 125 mL/h via INTRAVENOUS

## 2020-03-04 MED ORDER — MEPERIDINE HCL 25 MG/ML IJ SOLN: 6.2500 mg | INTRAMUSCULAR | Status: DC | PRN

## 2020-03-04 MED ORDER — OXYCODONE HCL 5 MG PO TABS: 5.0000 mg | ORAL_TABLET | Freq: Once | ORAL | Status: DC | PRN

## 2020-03-04 MED ORDER — KETOROLAC TROMETHAMINE 30 MG/ML IJ SOLN
INTRAMUSCULAR | Status: AC
  Filled 2020-03-04: qty 1

## 2020-03-04 MED ORDER — DEXTROSE 5 % IV SOLN
3.0000 g | INTRAVENOUS | Status: AC
  Administered 2020-03-04: 3 g via INTRAVENOUS

## 2020-03-04 MED ORDER — ONDANSETRON HCL 4 MG/2ML IJ SOLN
INTRAMUSCULAR | Status: DC | PRN
  Administered 2020-03-04: 4 mg via INTRAVENOUS

## 2020-03-04 MED ORDER — SCOPOLAMINE 1 MG/3DAYS TD PT72
1.0000 | MEDICATED_PATCH | Freq: Once | TRANSDERMAL | Status: AC
  Administered 2020-03-04: 1.5 mg via TRANSDERMAL

## 2020-03-04 MED ORDER — ZOLPIDEM TARTRATE 5 MG PO TABS: 5.0000 mg | ORAL_TABLET | Freq: Every evening | ORAL | Status: DC | PRN

## 2020-03-04 MED ORDER — DIPHENHYDRAMINE HCL 25 MG PO CAPS: 25.0000 mg | ORAL_CAPSULE | Freq: Four times a day (QID) | ORAL | Status: DC | PRN

## 2020-03-04 MED ORDER — DIPHENHYDRAMINE HCL 50 MG/ML IJ SOLN
12.5000 mg | INTRAMUSCULAR | Status: DC | PRN
  Administered 2020-03-04 – 2020-03-05 (×2): 12.5 mg via INTRAVENOUS
  Filled 2020-03-04: qty 1

## 2020-03-04 MED ORDER — FENTANYL CITRATE (PF) 100 MCG/2ML IJ SOLN
INTRAMUSCULAR | Status: DC | PRN
Start: 2020-03-04 — End: 2020-03-04
  Administered 2020-03-04: 15 ug via INTRATHECAL

## 2020-03-04 MED ORDER — ACETAMINOPHEN 500 MG PO TABS
1000.0000 mg | ORAL_TABLET | Freq: Three times a day (TID) | ORAL | Status: DC
  Administered 2020-03-04 – 2020-03-07 (×8): 1000 mg via ORAL
  Filled 2020-03-04 (×8): qty 2

## 2020-03-04 MED ORDER — PHENYLEPHRINE HCL-NACL 20-0.9 MG/250ML-% IV SOLN
INTRAVENOUS | Status: DC | PRN
  Administered 2020-03-04: 60 ug/min via INTRAVENOUS

## 2020-03-04 MED ORDER — HYDROMORPHONE HCL 1 MG/ML IJ SOLN: 0.2500 mg | INTRAMUSCULAR | Status: DC | PRN

## 2020-03-04 MED ORDER — NALBUPHINE HCL 10 MG/ML IJ SOLN: 5.0000 mg | Freq: Once | INTRAMUSCULAR | Status: DC | PRN

## 2020-03-04 MED ORDER — NALOXONE HCL 0.4 MG/ML IJ SOLN: 0.4000 mg | INTRAMUSCULAR | Status: DC | PRN

## 2020-03-04 MED ORDER — PRENATAL MULTIVITAMIN CH
1.0000 | ORAL_TABLET | Freq: Every day | ORAL | Status: DC
  Administered 2020-03-05 – 2020-03-07 (×3): 1 via ORAL
  Filled 2020-03-04 (×4): qty 1

## 2020-03-04 MED ORDER — ENOXAPARIN SODIUM 80 MG/0.8ML ~~LOC~~ SOLN
0.5000 mg/kg | SUBCUTANEOUS | Status: DC
  Administered 2020-03-05 – 2020-03-07 (×3): 65 mg via SUBCUTANEOUS
  Filled 2020-03-04 (×3): qty 0.8

## 2020-03-04 MED ORDER — OXYCODONE HCL 5 MG PO TABS
5.0000 mg | ORAL_TABLET | ORAL | Status: DC | PRN
  Administered 2020-03-05 – 2020-03-07 (×6): 5 mg via ORAL
  Filled 2020-03-04 (×7): qty 1

## 2020-03-04 MED ORDER — METOCLOPRAMIDE HCL 5 MG/ML IJ SOLN
INTRAMUSCULAR | Status: AC
  Filled 2020-03-04: qty 2

## 2020-03-04 MED ORDER — SIMETHICONE 80 MG PO CHEW
80.0000 mg | CHEWABLE_TABLET | Freq: Three times a day (TID) | ORAL | Status: DC
  Administered 2020-03-05 – 2020-03-06 (×6): 80 mg via ORAL
  Filled 2020-03-04 (×6): qty 1

## 2020-03-04 MED ORDER — SILVER NITRATE-POT NITRATE 75-25 % EX MISC
CUTANEOUS | Status: AC
  Filled 2020-03-04: qty 10

## 2020-03-04 MED ORDER — FUROSEMIDE 10 MG/ML IJ SOLN
10.0000 mg | Freq: Once | INTRAMUSCULAR | Status: AC
  Administered 2020-03-04: 10 mg via INTRAVENOUS

## 2020-03-04 MED ORDER — FUROSEMIDE 10 MG/ML IJ SOLN
INTRAMUSCULAR | Status: AC
  Filled 2020-03-04: qty 2

## 2020-03-04 MED ORDER — ONDANSETRON HCL 4 MG/2ML IJ SOLN: 4.0000 mg | Freq: Three times a day (TID) | INTRAMUSCULAR | Status: DC | PRN

## 2020-03-04 MED ORDER — BUPIVACAINE HCL (PF) 0.25 % IJ SOLN
INTRAMUSCULAR | Status: AC
  Filled 2020-03-04: qty 10

## 2020-03-04 MED ORDER — LACTATED RINGERS IV SOLN: INTRAVENOUS | Status: DC | PRN

## 2020-03-04 MED ORDER — OXYCODONE HCL 5 MG/5ML PO SOLN: 5.0000 mg | Freq: Once | ORAL | Status: DC | PRN

## 2020-03-04 MED ORDER — KETOROLAC TROMETHAMINE 30 MG/ML IJ SOLN
INTRAMUSCULAR | Status: DC | PRN
  Administered 2020-03-04: 30 mg via INTRAVENOUS

## 2020-03-04 MED ORDER — PROMETHAZINE HCL 25 MG/ML IJ SOLN: 6.2500 mg | INTRAMUSCULAR | Status: DC | PRN

## 2020-03-04 MED ORDER — KETOROLAC TROMETHAMINE 30 MG/ML IJ SOLN: 30.0000 mg | Freq: Once | INTRAMUSCULAR | Status: DC | PRN

## 2020-03-04 MED ORDER — DIPHENHYDRAMINE HCL 25 MG PO CAPS: 25.0000 mg | ORAL_CAPSULE | ORAL | Status: DC | PRN

## 2020-03-04 MED ORDER — SIMETHICONE 80 MG PO CHEW
80.0000 mg | CHEWABLE_TABLET | ORAL | Status: DC
  Administered 2020-03-04 – 2020-03-07 (×2): 80 mg via ORAL
  Filled 2020-03-04 (×3): qty 1

## 2020-03-04 MED ORDER — DEXAMETHASONE SODIUM PHOSPHATE 4 MG/ML IJ SOLN
INTRAMUSCULAR | Status: AC
  Filled 2020-03-04: qty 2

## 2020-03-04 MED ORDER — DEXTROSE 5 % IV SOLN
INTRAVENOUS | Status: AC
  Filled 2020-03-04: qty 3000

## 2020-03-04 MED ORDER — DIPHENHYDRAMINE HCL 50 MG/ML IJ SOLN
INTRAMUSCULAR | Status: AC
  Filled 2020-03-04: qty 1

## 2020-03-04 MED ORDER — SCOPOLAMINE 1 MG/3DAYS TD PT72
MEDICATED_PATCH | TRANSDERMAL | Status: AC
  Filled 2020-03-04: qty 1

## 2020-03-04 MED ORDER — PHENYLEPHRINE HCL-NACL 20-0.9 MG/250ML-% IV SOLN
INTRAVENOUS | Status: AC
  Filled 2020-03-04: qty 250

## 2020-03-04 MED ORDER — OXYTOCIN-SODIUM CHLORIDE 30-0.9 UT/500ML-% IV SOLN: 2.5000 [IU]/h | INTRAVENOUS | Status: AC

## 2020-03-04 MED ORDER — OXYTOCIN-SODIUM CHLORIDE 30-0.9 UT/500ML-% IV SOLN
INTRAVENOUS | Status: AC
  Filled 2020-03-04: qty 500

## 2020-03-04 MED ORDER — SODIUM CHLORIDE 0.9 % IV SOLN: INTRAVENOUS | Status: DC | PRN

## 2020-03-04 MED ORDER — TRANEXAMIC ACID-NACL 1000-0.7 MG/100ML-% IV SOLN
INTRAVENOUS | Status: AC
  Filled 2020-03-04: qty 100

## 2020-03-04 MED ORDER — TETANUS-DIPHTH-ACELL PERTUSSIS 5-2.5-18.5 LF-MCG/0.5 IM SUSP: 0.5000 mL | Freq: Once | INTRAMUSCULAR | Status: DC

## 2020-03-04 MED ORDER — NALOXONE HCL 4 MG/10ML IJ SOLN
1.0000 ug/kg/h | INTRAVENOUS | Status: DC | PRN
  Filled 2020-03-04: qty 5

## 2020-03-04 MED ORDER — POVIDONE-IODINE 10 % EX SWAB
2.0000 "application " | Freq: Once | CUTANEOUS | Status: AC
  Administered 2020-03-04: 2 via TOPICAL

## 2020-03-04 MED ORDER — TRANEXAMIC ACID-NACL 1000-0.7 MG/100ML-% IV SOLN
1000.0000 mg | INTRAVENOUS | Status: AC
  Administered 2020-03-04: 1000 mg via INTRAVENOUS

## 2020-03-04 MED ORDER — DIBUCAINE (PERIANAL) 1 % EX OINT: 1.0000 "application " | TOPICAL_OINTMENT | CUTANEOUS | Status: DC | PRN

## 2020-03-04 MED ORDER — PHENYLEPHRINE 40 MCG/ML (10ML) SYRINGE FOR IV PUSH (FOR BLOOD PRESSURE SUPPORT)
PREFILLED_SYRINGE | INTRAVENOUS | Status: AC
  Filled 2020-03-04: qty 10

## 2020-03-04 MED ORDER — MEASLES, MUMPS & RUBELLA VAC IJ SOLR: 0.5000 mL | Freq: Once | INTRAMUSCULAR | Status: DC

## 2020-03-04 MED ORDER — SODIUM CHLORIDE 0.9 % IV SOLN: Freq: Once | INTRAVENOUS | Status: AC

## 2020-03-04 MED ORDER — LACTATED RINGERS IV SOLN: INTRAVENOUS | Status: DC

## 2020-03-04 MED ORDER — SODIUM CHLORIDE 0.9% FLUSH: 3.0000 mL | INTRAVENOUS | Status: DC | PRN

## 2020-03-04 MED ORDER — FENTANYL CITRATE (PF) 100 MCG/2ML IJ SOLN
INTRAMUSCULAR | Status: AC
  Filled 2020-03-04: qty 2

## 2020-03-04 MED ORDER — WITCH HAZEL-GLYCERIN EX PADS: 1.0000 "application " | MEDICATED_PAD | CUTANEOUS | Status: DC | PRN

## 2020-03-04 MED ORDER — SIMETHICONE 80 MG PO CHEW: 80.0000 mg | CHEWABLE_TABLET | ORAL | Status: DC | PRN

## 2020-03-04 MED ORDER — MISOPROSTOL 200 MCG PO TABS
ORAL_TABLET | ORAL | Status: AC
  Filled 2020-03-04: qty 4

## 2020-03-04 MED ORDER — SENNOSIDES-DOCUSATE SODIUM 8.6-50 MG PO TABS
2.0000 | ORAL_TABLET | ORAL | Status: DC
  Administered 2020-03-04 – 2020-03-07 (×3): 2 via ORAL
  Filled 2020-03-04 (×3): qty 2

## 2020-03-04 MED ORDER — MORPHINE SULFATE (PF) 0.5 MG/ML IJ SOLN
INTRAMUSCULAR | Status: DC | PRN
Start: 2020-03-04 — End: 2020-03-04
  Administered 2020-03-04: 150 ug via INTRATHECAL

## 2020-03-04 MED ORDER — MISOPROSTOL 200 MCG PO TABS
800.0000 ug | ORAL_TABLET | ORAL | Status: AC
  Administered 2020-03-04: 800 ug via RECTAL

## 2020-03-04 MED ORDER — PHENYLEPHRINE HCL (PRESSORS) 10 MG/ML IV SOLN
INTRAVENOUS | Status: AC
  Filled 2020-03-04: qty 1

## 2020-03-04 MED ORDER — OXYTOCIN-SODIUM CHLORIDE 30-0.9 UT/500ML-% IV SOLN
INTRAVENOUS | Status: DC | PRN
  Administered 2020-03-04: 30 [IU] via INTRAVENOUS

## 2020-03-04 SURGICAL SUPPLY — 35 items
APL SKNCLS STERI-STRIP NONHPOA (GAUZE/BANDAGES/DRESSINGS) ×1
BENZOIN TINCTURE PRP APPL 2/3 (GAUZE/BANDAGES/DRESSINGS) ×3 IMPLANT
CHLORAPREP W/TINT 26ML (MISCELLANEOUS) ×3 IMPLANT
CLAMP CORD UMBIL (MISCELLANEOUS) IMPLANT
CLOSURE STERI STRIP 1/2 X4 (GAUZE/BANDAGES/DRESSINGS) ×2 IMPLANT
CLOTH BEACON ORANGE TIMEOUT ST (SAFETY) ×3 IMPLANT
DRSG OPSITE POSTOP 4X10 (GAUZE/BANDAGES/DRESSINGS) ×3 IMPLANT
ELECT REM PT RETURN 9FT ADLT (ELECTROSURGICAL) ×3
ELECTRODE REM PT RTRN 9FT ADLT (ELECTROSURGICAL) ×1 IMPLANT
EXTRACTOR VACUUM M CUP 4 TUBE (SUCTIONS) IMPLANT
EXTRACTOR VACUUM M CUP 4' TUBE (SUCTIONS)
GLOVE BIOGEL PI IND STRL 7.0 (GLOVE) ×2 IMPLANT
GLOVE BIOGEL PI IND STRL 7.5 (GLOVE) ×2 IMPLANT
GLOVE BIOGEL PI INDICATOR 7.0 (GLOVE) ×4
GLOVE BIOGEL PI INDICATOR 7.5 (GLOVE) ×4
GLOVE ECLIPSE 7.5 STRL STRAW (GLOVE) ×3 IMPLANT
GOWN STRL REUS W/TWL LRG LVL3 (GOWN DISPOSABLE) ×9 IMPLANT
KIT ABG SYR 3ML LUER SLIP (SYRINGE) IMPLANT
NDL HYPO 25X5/8 SAFETYGLIDE (NEEDLE) IMPLANT
NEEDLE HYPO 25X5/8 SAFETYGLIDE (NEEDLE) IMPLANT
NS IRRIG 1000ML POUR BTL (IV SOLUTION) ×3 IMPLANT
PACK C SECTION WH (CUSTOM PROCEDURE TRAY) ×3 IMPLANT
PAD OB MATERNITY 4.3X12.25 (PERSONAL CARE ITEMS) ×3 IMPLANT
PENCIL SMOKE EVAC W/HOLSTER (ELECTROSURGICAL) ×3 IMPLANT
RTRCTR C-SECT PINK 25CM LRG (MISCELLANEOUS) ×3 IMPLANT
STRIP CLOSURE SKIN 1/2X4 (GAUZE/BANDAGES/DRESSINGS) ×2 IMPLANT
SUT VIC AB 0 CT1 36 (SUTURE) ×3 IMPLANT
SUT VIC AB 0 CTX 36 (SUTURE) ×6
SUT VIC AB 0 CTX36XBRD ANBCTRL (SUTURE) ×2 IMPLANT
SUT VIC AB 2-0 CT1 27 (SUTURE) ×3
SUT VIC AB 2-0 CT1 TAPERPNT 27 (SUTURE) ×1 IMPLANT
SUT VIC AB 4-0 KS 27 (SUTURE) ×3 IMPLANT
TOWEL OR 17X24 6PK STRL BLUE (TOWEL DISPOSABLE) ×3 IMPLANT
TRAY FOLEY W/BAG SLVR 14FR LF (SET/KITS/TRAYS/PACK) ×3 IMPLANT
WATER STERILE IRR 1000ML POUR (IV SOLUTION) ×3 IMPLANT

## 2020-03-04 NOTE — Anesthesia Postprocedure Evaluation (Signed)
Anesthesia Post Note  Patient: Carrie Cardenas  Procedure(s) Performed: CESAREAN SECTION WITH BILATERAL TUBAL LIGATION (N/A )     Patient location during evaluation: PACU Anesthesia Type: Spinal Level of consciousness: awake and alert Pain management: pain level controlled Vital Signs Assessment: post-procedure vital signs reviewed and stable Respiratory status: spontaneous breathing, nonlabored ventilation and respiratory function stable Cardiovascular status: blood pressure returned to baseline and stable Postop Assessment: no apparent nausea or vomiting Anesthetic complications: no   No complications documented.  Last Vitals:  Vitals:   03/04/20 1515 03/04/20 1530  BP: 118/82   Pulse: (!) 58 63  Resp: 14 15  Temp:    SpO2: 100% 99%    Last Pain:  Vitals:   03/04/20 1515  TempSrc:   PainSc: 0-No pain   Pain Goal:    LLE Motor Response: Purposeful movement (03/04/20 1515) LLE Sensation: Tingling (03/04/20 1515) RLE Motor Response: Purposeful movement (03/04/20 1515) RLE Sensation: Tingling (03/04/20 1515)     Epidural/Spinal Function Cutaneous sensation: Tingles (03/04/20 1515), Patient able to flex knees: No (03/04/20 1515), Patient able to lift hips off bed: No (03/04/20 1515), Back pain beyond tenderness at insertion site: No (03/04/20 1515), Progressively worsening motor and/or sensory loss: No (03/04/20 1515), Bowel and/or bladder incontinence post epidural: No (03/04/20 1515)  Lowella Curb

## 2020-03-04 NOTE — Transfer of Care (Signed)
Immediate Anesthesia Transfer of Care Note  Patient: Carrie Cardenas  Procedure(s) Performed: CESAREAN SECTION (N/A ) CESAREAN SECTION WITH BILATERAL TUBAL LIGATION (N/A )  Patient Location: PACU  Anesthesia Type:Spinal  Level of Consciousness: awake  Airway & Oxygen Therapy: Patient Spontanous Breathing  Post-op Assessment: Report given to RN and Post -op Vital signs reviewed and stable  Post vital signs: Reviewed and stable  Last Vitals:  Vitals Value Taken Time  BP 107/71 03/04/20 1415  Temp    Pulse 59 03/04/20 1416  Resp 21 03/04/20 1416  SpO2 100 % 03/04/20 1416  Vitals shown include unvalidated device data.  Last Pain:  Vitals:   03/04/20 0800  TempSrc: Oral         Complications: No complications documented.

## 2020-03-04 NOTE — Anesthesia Preprocedure Evaluation (Signed)
Anesthesia Evaluation  Patient identified by MRN, date of birth, ID band Patient awake    Reviewed: Allergy & Precautions, NPO status , Patient's Chart, lab work & pertinent test resultsPreop documentation limited or incomplete due to emergent nature of procedure.  Airway Mallampati: III  TM Distance: >3 FB Neck ROM: Full    Dental  (+) Teeth Intact, Dental Advisory Given   Pulmonary neg pulmonary ROS,    Pulmonary exam normal breath sounds clear to auscultation       Cardiovascular Exercise Tolerance: Good hypertension, Normal cardiovascular exam Rhythm:Regular Rate:Normal     Neuro/Psych PSYCHIATRIC DISORDERS Depression negative neurological ROS     GI/Hepatic negative GI ROS, Neg liver ROS, S/p gastric bypass Concern for esophageal tear   Endo/Other  Morbid obesity  Renal/GU negative Renal ROS     Musculoskeletal negative musculoskeletal ROS (+)   Abdominal (+) + obese,   Peds  Hematology  (+) Blood dyscrasia, anemia ,   Anesthesia Other Findings Day of surgery medications reviewed with the patient.  Reproductive/Obstetrics (+) Pregnancy S/p delivery 4 days ago, now with uncontrolled vaginal bleeding                             Anesthesia Physical  Anesthesia Plan  ASA: III  Anesthesia Plan: Spinal   Post-op Pain Management:    Induction:   PONV Risk Score and Plan: 2 and Ondansetron, Midazolam and Treatment may vary due to age or medical condition  Airway Management Planned: Natural Airway  Additional Equipment:   Intra-op Plan:   Post-operative Plan:   Informed Consent: I have reviewed the patients History and Physical, chart, labs and discussed the procedure including the risks, benefits and alternatives for the proposed anesthesia with the patient or authorized representative who has indicated his/her understanding and acceptance.     Dental advisory  given  Plan Discussed with: CRNA  Anesthesia Plan Comments:         Anesthesia Quick Evaluation

## 2020-03-04 NOTE — Anesthesia Procedure Notes (Signed)
Spinal  Patient location during procedure: OB Start time: 03/04/2020 12:36 PM End time: 03/04/2020 12:46 PM Staffing Performed: anesthesiologist  Anesthesiologist: Lowella Curb, MD Preanesthetic Checklist Completed: patient identified, IV checked, risks and benefits discussed, surgical consent, monitors and equipment checked, pre-op evaluation and timeout performed Spinal Block Patient position: sitting Prep: DuraPrep and site prepped and draped Patient monitoring: heart rate, cardiac monitor, continuous pulse ox and blood pressure Approach: midline Location: L3-4 Injection technique: single-shot Needle Needle type: Quincke  Needle gauge: 22 G Needle length: 10 cm Assessment Sensory level: T4 Additional Notes SAB unsuccessful with 24 g pencan.  Switched to 22 g Quincke.  Successful on first pass.

## 2020-03-04 NOTE — H&P (Addendum)
Obstetric Preoperative History and Physical  Carrie Cardenas is a 29 y.o. G2P1001 with IUP at 69w1dpresenting for scheduled cesarean section.  Reports good fetal movement, no bleeding, no contractions, no leaking of fluid.  No acute preoperative concerns.     Cesarean Section Indication: desires primary LTCS given hx of traumatic birthing experience (retained POC, PPH, followed by upper GI bleed and ventilator support)  Prenatal Course Source of Care: [facility]    Pregnancy complications or risks: Patient Active Problem List   Diagnosis Date Noted   Maternal iron deficiency anemia complicating pregnancy in third trimester 01/07/2020   Chlamydia infection affecting pregnancy in second trimester, antepartum 10/23/2019   Supervision of high risk pregnancy, antepartum 10/15/2019   History of pre-eclampsia in prior pregnancy, currently pregnant 10/15/2019   History of postpartum hemorrhage, currently pregnant 10/15/2019   History of herpes genitalis    Obesity affecting pregnancy    History of gastric bypass    Ac[place]emic respiratory failure ([place]    LGSIL on Pap smear of cervix 01/19/2016   Genital warts complicating pregnancy 089/37/3428  She plans to breastfeed She desires bilateral tubal ligation for postpartum contraception.   Prenatal labs and studies: ABO, Rh: --/--/A POS (09/14 1853) Antibody: NEG (09/14 1853) Rubella: 4.44 (05/04 1023) RPR: Non Reactive (07/06 0948)  HBsAg: NON REACTIVE (09/14 1542)  HIV: NON REACTIVE (09/14 1853)  GJGO:TLXBWIOM/- (09/07 1408) 2 hr Glucola  normal Genetic screening normal Anatomy UKoreanormal  Prenatal Transfer Tool  Maternal Diabetes: No Genetic Screening: Normal Maternal Ultrasounds/Referrals: Normal Fetal Ultrasounds or other Referrals:  None Maternal Substance Abuse:  No Significant Maternal Medications:  None Significant Maternal Lab Results: Group B Strep positive  Past Medical History:  Diagnosis Date   Depression    pt  hospitalized at 29years old for major depression   Herpes genitalis in women    Hypovolemic shock ([place]    Mallory-Weiss tear 04/07/2016   Pregnancy induced hypertension    Retained products of conception, following delivery with hemorrhage 04/05/2016   Sepsis ([place]    Severe preeclampsia, third trimester 03/30/2016    Past Surgical History:  Procedure Laterality Date   DILATION AND EVACUATION N/A 04/05/2016   Procedure: DILATATION AND EVACUATION;  Surgeon: UOsborne Oman MD;  [facility];  Service: Gynecology;  Laterality: N/A;   ESOPHAGOGASTRODUODENOSCOPY N/A 04/07/2016   Procedure: ESOPHAGOGASTRODUODENOSCOPY (EGD);  Surgeon: VWilford Corner MD;  [facility];  Service: Endoscopy;  Laterality: N/A;   ESOPHAGOGASTRODUODENOSCOPY N/A 04/07/2016   Procedure: ESOPHAGOGASTRODUODENOSCOPY (EGD);  Surgeon: VWilford Corner MD;  [place]  Service: Endoscopy;  Laterality: N/A;   ESOPHAGOGASTRODUODENOSCOPY N/A 04/08/2016   Procedure: ESOPHAGOGASTRODUODENOSCOPY (EGD);  Surgeon: DAlphonsa Overall MD;  [place]  Service: General;  Laterality: N/A;   GASTRIC BYPASS     SLEEVE GASTROPLASTY  2015    OB History  Gravida Para Term Preterm AB Living  2 1 1  0 0 1  SAB TAB Ectopic Multiple Live Births  0 0 0 0 1    # Outcome Date GA Lbr Len/2nd Weight Sex Delivery Anes PTL Lv  2 Current           1 Term 04/01/16 337w5d5:35 / 00:48 3035 g M Vag-Spont EPI  LIV     Birth Comments: preeclampsia, postpartum hemorrhage and retained poducts of conception, sepsis    Social History   Socioeconomic History   Marital status: Single    Spouse name: Not on file  Number of children: Not on file   Years of education: Not on file   Highest education level: Not on file  Occupational History   Not on file  Tobacco Use   Smoking status: Never Smoker   Smokeless tobacco: Never Used  Vaping Use   Vaping Use: Never used  Substance and Sexual Activity   Alcohol use:  No   Drug use: No   Sexual activity: Yes    Birth control/protection: None  Other Topics Concern   Not on file  Social History Narrative   Not on file   Social Determinants of Health   Financial Resource Strain:    Difficulty of Paying Living Expenses: Not on file  Food Insecurity: No Food Insecurity   Worried About Running Out of Food in the Last Year: Never true   Ran Out of Food in the Last Year: Never true  Transportation Needs: Unmet Transportation Needs   Lack of Transportation (Medical): Yes   Lack of Transportation (Non-Medical): No  Physical Activity:    Days of Exercise per Week: Not on file   Minutes of Exercise per Session: Not on file  Stress:    Feeling of Stress : Not on file  Social Connections:    Frequency of Communication with Friends and Family: Not on file   Frequency of Social Gatherings with Friends and Family: Not on file   Attends Religious Services: Not on file   Active Member of Clubs or Organizations: Not on file   Attends Archivist Meetings: Not on file   Marital Status: Not on file    Family History  Problem Relation Age of Onset   Fibroids Mother    Anemia Mother    Pulmonary embolism Mother    Cancer Maternal Grandmother     Medications Prior to Admission  Medication Sig Dispense Refill Last Dose   Blood Pressure Monitoring (BLOOD PRESSURE KIT) DEVI 1 Device by Does not apply route as needed. (Patient not taking: Reported on 02/03/2020) 1 each 0    ferrous sulfate (FERROUSUL) 325 (65 FE) MG tablet Take 1 tablet (325 mg total) by mouth 2 (two) times daily. 60 tablet 1    Misc. Devices (GOJJI WEIGHT SCALE) MISC 1 Device by Does not apply route daily. (Patient not taking: Reported on 01/17/2020) 1 each 0    Prenatal Vit-Fe Fumarate-FA (PREPLUS) 27-1 MG TABS Take 1 tablet by mouth daily.      valACYclovir (VALTREX) 500 MG tablet Take 1 tablet (500 mg total) by mouth 2 (two) times daily. 60 tablet 5     No Known Allergies  Review  of Systems: Pertinent items noted in HPI and remainder of comprehensive ROS otherwise negative.  Physical Exam: BP (!) 122/106   Pulse 92   Temp 98.7 F (37.1 C) (Oral)   Resp 16   Ht 5' 5"  (1.651 m)   Wt 131.9 kg   LMP 06/11/2019   BMI 48.39 kg/m  FHR by Doppler: 140 bpm CONSTITUTIONAL: Well-developed, well-nourished female in no acute distress.  HENT:  Normocephalic, atraumatic, External right and left ear normal. Oropharynx is clear and moist EYES: Conjunctivae and EOM are normal. No scleral icterus.  NECK: Normal range of motion, supple, no masses SKIN: Skin is warm and dry. No rash noted. Not diaphoretic. No erythema. No pallor. [place]: Alert and oriented to person, place, and time. Normal reflexes, muscle tone coordination. No cranial nerve deficit noted. PSYCHIATRIC: Normal mood and affect. Normal behavior. Normal judgment and  thought content. CARDIOVASCULAR: Normal heart rate noted RESPIRATORY: Effort and breath sounds normal, no problems with respiration noted ABDOMEN: Soft, nontender, nondistended, gravid.  PELVIC: Deferred MUSCULOSKELETAL: Normal range of motion. No edema and no tenderness. 2+ distal pulses.   Pertinent Labs/Studies:   Results for orders placed or performed during the hospital encounter of 03/03/20 (from the past 72 hour(s))  CBC     Status: Abnormal   Collection Time: 03/03/20  3:42 PM  Result Value Ref Range   WBC 4.3 4.0 - 10.5 K/uL   RBC 3.67 (L) 3.87 - 5.11 MIL/uL   Hemoglobin 10.4 (L) 12.0 - 15.0 g/dL   HCT 31.1 (L) 36 - 46 %   MCV 84.7 80.0 - 100.0 fL   MCH 28.3 26.0 - 34.0 pg   MCHC 33.4 30.0 - 36.0 g/dL   RDW 16.1 (H) 11.5 - 15.5 %   Platelets 141 (L) 150 - 400 K/uL   nRBC 0.0 0.0 - 0.2 %    Comment: Performed at [facility], [address], [place], [place] [address]  Comprehensive metabolic panel     Status: Abnormal   Collection Time: 03/03/20  3:42 PM  Result Value Ref Range   Sodium 138 135 - 145 mmol/L   Potassium  3.5 3.5 - 5.1 mmol/L   Chloride 108 98 - 111 mmol/L   CO2 19 (L) 22 - 32 mmol/L   Glucose, Bld 80 70 - 99 mg/dL    Comment: Glucose reference range applies only to samples taken after fasting for at least 8 hours.   BUN 7 6 - 20 mg/dL   Creatinine, Ser 0.58 0.44 - 1.00 mg/dL   Calcium 9.5 8.9 - 10.3 mg/dL   Total Protein 6.3 (L) 6.5 - 8.1 g/dL   Albumin 3.1 (L) 3.5 - 5.0 g/dL   AST 50 (H) 15 - 41 U/L   ALT 58 (H) 0 - 44 U/L   Alkaline Phosphatase 179 (H) 38 - 126 U/L   Total Bilirubin 0.7 0.3 - 1.2 mg/dL   GFR calc non Af Amer >60 >60 mL/min   GFR calc Af Amer >60 >60 mL/min   Anion gap 11 5 - 15    Comment: Performed at [facility] [address] [place], [place] [address]  Hepatitis panel, acute     Status: None   Collection Time: 03/03/20  3:42 PM  Result Value Ref Range   Hepatitis B Surface Ag NON REACTIVE NON REACTIVE   HCV Ab NON REACTIVE NON REACTIVE    Comment: (NOTE) Nonreactive HCV antibody screen is consistent with no HCV infections,  unless recent infection is suspected or other evidence exists to indicate HCV infection.     Hep A IgM NON REACTIVE NON REACTIVE   Hep B C IgM NON REACTIVE NON REACTIVE    Comment: Performed at [facility], [place] [address] [place], [place] [address]  Protein / creatinine ratio, urine     Status: None   Collection Time: 03/03/20  3:54 PM  Result Value Ref Range   Creatinine, Urine 457.78 mg/dL    Comment: RESULTS CONFIRMED BY MANUAL DILUTION   Total Protein, Urine 21 mg/dL    Comment: NO NORMAL RANGE ESTABLISHED FOR THIS TEST   Protein Creatinine Ratio 0.05 0.00 - 0.15 mg/mg[Cre]    Comment: Performed at [facility] [address] [place], [place] [address]  Urinalysis, Routine w reflex microscopic Urine, Clean Catch  Status: Abnormal   Collection Time: 03/03/20  3:54 PM  Result Value Ref Range   Color, Urine AMBER (A) YELLOW    Comment: BIOCHEMICALS MAY BE AFFECTED BY COLOR   APPearance HAZY (A) CLEAR    Specific Gravity, Urine 1.026 1.005 - 1.030   pH 5.0 5.0 - 8.0   Glucose, UA NEGATIVE NEGATIVE mg/dL   Hgb urine dipstick NEGATIVE NEGATIVE   Bilirubin Urine SMALL (A) NEGATIVE   Ketones, ur NEGATIVE NEGATIVE mg/dL   Protein, ur 30 (A) NEGATIVE mg/dL   Nitrite NEGATIVE NEGATIVE   Leukocytes,Ua MODERATE (A) NEGATIVE   RBC / HPF 0-5 0 - 5 RBC/hpf   WBC, UA 11-20 0 - 5 WBC/hpf   Bacteria, UA FEW (A) NONE SEEN   Squamous Epithelial / LPF 0-5 0 - 5   Mucus PRESENT    Ca Oxalate Crys, UA PRESENT     Comment: Performed at [facility], [address], [place], [place] [address]  SARS Coronavirus 2 by RT PCR (hospital order, performed in [facility] hospital lab) Nasopharyngeal Nasopharyngeal Swab     Status: None   Collection Time: 03/03/20  6:33 PM   Specimen: Nasopharyngeal Swab  Result Value Ref Range   SARS Coronavirus 2 NEGATIVE NEGATIVE    Comment: (NOTE) SARS-CoV-2 target nucleic acids are NOT DETECTED.  The SARS-CoV-2 RNA is generally detectable in upper and lower respiratory specimens during the acute phase of infection. The lowest concentration of SARS-CoV-2 viral copies this assay can detect is 250 copies / mL. A negative result does not preclude SARS-CoV-2 infection and should not be used as the sole basis for treatment or other patient management decisions.  A negative result may occur with improper specimen collection / handling, submission of specimen other than nasopharyngeal swab, presence of viral mutation(s) within the areas targeted by this assay, and inadequate number of viral copies (<250 copies / mL). A negative result must be combined with clinical observations, patient history, and epidemiological information.  Fact Sheet for Patients:   StrictlyIdeas.no  Fact Sheet for Healthcare Providers: BankingDealers.co.za  This test is not yet approved or  cleared by the [place] FDA and has been authorized  for detection and/or diagnosis of SARS-CoV-2 by FDA under an Emergency Use Authorization (EUA).  This EUA will remain in effect (meaning this test can be used) for the duration of the COVID-19 declaration under Section 564(b)(1) of the Act, 21 U.S.C. section 360bbb-3(b)(1), unless the authorization is terminated or revoked sooner.  Performed at [facility], [place] [address] [place], [place] [address]   Rapid HIV screen (HIV 1/2 Ab+Ag)     Status: None   Collection Time: 03/03/20  6:53 PM  Result Value Ref Range   HIV-1 P24 Antigen - HIV24 NON REACTIVE NON REACTIVE    Comment: (NOTE) Detection of p24 may be inhibited by biotin in the sample, causing false negative results in acute infection.    HIV 1/2 Antibodies NON REACTIVE NON REACTIVE   Interpretation (HIV Ag Ab)      A non reactive test result means that HIV 1 or HIV 2 antibodies and HIV 1 p24 antigen were not detected in the specimen.    Comment: Performed at [facility], [place] [address], [place], [place] [address]  Type and screen     Status: None   Collection Time: 03/03/20  6:53 PM  Result Value Ref Range   ABO/RH(D) A POS    Antibody Screen  NEG    Sample Expiration      03/06/2020,2359 Performed at [facility], [place] [address] [place], [place] [address]     Assessment and Plan: Carrie Cardenas is a 29 y.o. G2P1001 at 70w1dbeing admitted for scheduled cesarean section with bilateral salpingectomy [facility].The risks of cesarean section discussed with the patient included but were not limited to: bleeding which may require transfusion or reoperation; infection which may require antibiotics; injury to bowel, bladder, ureters or other surrounding organs; injury to the fetus; need for additional procedures including hysterectomy in the event of a life-threatening hemorrhage; placental abnormalities with subsequent pregnancies, incisional problems, thromboembolic phenomenon  and other postoperative/anesthesia complications. The patient concurred with the proposed plan, giving informed written consent for the procedure. Patient has been NPO since last night she will remain NPO for procedure. Anesthesia and OR aware. Preoperative prophylactic antibiotics and SCDs ordered on call to the OR. To OR when ready.   Pregnancy Complications:   --PEC w/o severe features   --hx of gastric bypass  --hx of postpartum hemorrhage   --BMI 48   Contraception: desires BTL during CS  Circumcision: Yes   JSharene Skeans MD OB Family Medicine Fellow, [facility]An[facility] Centerfor [facility] DurhamAttending Pt seen and examined. Agree with above POC.  MArlina Robes MD

## 2020-03-04 NOTE — Progress Notes (Addendum)
Brief Update Note   Called to bedside by RN as patient noted to be expressing large bloot clots and fundus 2cm above the umbilicus. Patient evaluated and noted to have continued vaginal bleeding and some drainage from incision site. Vital signs stable. Administered 1g TXA and cytotec PR. CBC, CMP obtained. Uterus firm and reduced to level of umbilicus.  Continued oozing 1 hour after administration and called back to bedside. Uterus firm at umbilicus. Dressing removed and incision inspected - demonstrating continued bleeding from left aspect of wound. Dr. Alysia Penna present. Patient evaluated and determined to still have adequate anesthesia - six staples placed at left side of wound with good hemostasis observed. Honeycomb replaced and pressure dressing applied. No vaginal bleeding with fundal massage.    Hgb 10.4 --> 8.3. Fibrinogen 324. UOP 30cc/hr. Administered 2unit pRBC and 500cc NS bolus. VSS. Reassess CBC after pRBC administration.   Casper Harrison, MD [facility] Family Medicine Fellow, [facility] for [facility], [facility]   [facility] Attending Pt seen and examined with [facility] fellow. Agree with above POC.  POC discussed with pt and family member at bedside.  Nettie Elm, MD

## 2020-03-04 NOTE — Op Note (Signed)
Cesarean Section Procedure Note  03/04/2020  2:31 PM  PATIENT:  Carrie Cardenas  29 y.o. female  PRE-OPERATIVE DIAGNOSIS:  pre-eclampsia, traumatic delivery, primary cesarean section  POST-OPERATIVE DIAGNOSIS:  pre-eclampsia, traumatic delivery, primary cesarean section with bilateral tubal ligation  PROCEDURE:  Procedure(s): CESAREAN SECTION WITH BILATERAL TUBAL LIGATION (N/A)  SURGEON:  Surgeon(s) and Role:    * Hermina Staggers, MD - Primary  ASSISTANTS: Casper Harrison, MD  ANESTHESIA:   spinal  EBL:  Total I/O In: 2200 [I.V.:2200] Out: 702 [Urine:150; Blood:552]  BLOOD ADMINISTERED:none  DRAINS: none   LOCAL MEDICATIONS USED:  NONE  SPECIMEN: bilateral fallopian tubes, placenta   DISPOSITION OF SPECIMEN:  PATHOLOGY   Procedure Details   The patient was seen in the Holding Room. The risks, benefits, complications, treatment options, and expected outcomes were discussed with the patient.  The patient concurred with the proposed plan, giving informed consent.  The site of surgery properly noted/marked. The patient was identified as Carrie Cardenas and the procedure verified as C-Section Delivery. A Time Out was held and the above information confirmed.  After induction of anesthesia, the patient was draped and prepped in the usual sterile manner. A Pfannenstiel incision was made and carried down through the subcutaneous tissue to the fascia. Fascial incision was made and extended transversely. The fascia was separated from the underlying rectus tissue superiorly and inferiorly. The peritoneum was identified and entered. Peritoneal incision was extended longitudinally. The utero-vesical peritoneal reflection was incised transversely and the bladder flap was bluntly freed from the lower uterine segment. A low transverse uterine incision was made. Delivered from cephalic presentation was a Female with Apgar scores of 8 at one minute and 9 at five minutes. After the umbilical cord  was clamped and cut cord blood was obtained for evaluation. The placenta was removed intact and appeared normal. The uterine outline, tubes and ovaries appeared normal. The uterine incision was closed with running locked sutures of Chromic. Hemostasis was observed.  Attention was turned first the left fallopian tube. This was clamped with kelly clamps and a portion was excised including the fimbriae. Tube was tied below kelly clamp with a portion of 2-0 vicryl. Tresa Endo was removed and hemostasis was observed. Attention was then turned to right fallopian tube, which was removed in a similar fashion with excellent hemostasis noted. Specimens sent to pathology.    Lavage was carried out until clear. The peritoneum was closed with 2-0 vicryl. The fascia was then reapproximated with running sutures of 2.0 and Vicryl. The skin was reapproximated with 3.0 and Vicryl.  Instrument, sponge, and needle counts were correct prior the abdominal closure and at the conclusion of the case.   Complications:  None; patient tolerated the procedure well.  COUNTS:  YES  PLAN OF CARE: Admit to inpatient   PATIENT DISPOSITION:  PACU - hemodynamically stable.   Delay start of Pharmacological VTE agent (>24hrs) due to surgical blood loss or risk of bleeding: not applicable             Disposition: PACU - hemodynamically stable.         Condition: stable   Gita Kudo, MD 03/04/2020 2:31 PM

## 2020-03-04 NOTE — Discharge Summary (Signed)
Postpartum Discharge Summary      Patient Name: Carrie Cardenas DOB: 1991-04-13 MRN: [id number]  Date of admission: 03/04/2020 Delivery date:03/04/2020  Delivering provider: Chancy Milroy  Date of discharge: 03/07/2020  Admitting diagnosis: Cesarean delivery delivered [O82] (history of traumatic delivery, not this admission) Intrauterine pregnancy: [redacted]w[redacted]d    Secondary diagnosis:  Active Problems:   Preeclampsia   Supervision of high risk pregnancy, antepartum   Obesity affecting pregnancy   Cesarean delivery delivered  Additional problems: [place].2L   Discharge diagnosis: Term Pregnancy Delivered, Preeclampsia (mild) and Anemia                                              Post partum procedures:postpartum tubal ligation, transfuse 2uPRBC Augmentation: N/A Complications: None  Hospital course: Sceduled C/S   29y.o. yo G2P2002 at 325w1das admitted to the hospital 03/04/2020 for scheduled cesarean section with the following indication:Elective Primary.Delivery details are as follows:  Membrane Rupture Time/Date: 1:14 PM ,03/04/2020   Delivery Method:C-Section, Low Transverse  Details of operation can be found in separate operative note.  Patient had an uncomplicated postpartum course other than PPH after delivery.  She is ambulating, tolerating a regular diet, passing flatus, and urinating well. Patient is discharged home in stable condition on  03/07/20        Newborn Data: Birth date:03/04/2020  Birth time:1:14 PM  Gender:Female  Living status:Living  Apgars:8 ,9  Weight:3585 g     Magnesium Sulfate received: No BMZ received: No Rhophylac:N/A MMR:No T-DaP:Given prenatally Flu: No Transfusion:No  Physical exam  Vitals:   03/06/20 1355 03/06/20 1558 03/06/20 2230 03/07/20 0457  BP: 131/86 (!) 134/95 131/81 (!) 143/90  Pulse: 72 78 89 82  Resp: 18 18 17 19   Temp: 98.6 F (37 C)  99.4 F (37.4 C) 97.7 F (36.5 C)  TempSrc:   Oral Oral  SpO2:      Weight:       Height:       General: alert, cooperative and no distress Lochia: appropriate Uterine Fundus: firm Incision: healing well, staples removed and new dressing applied at time of discharge.  DVT Evaluation: No evidence of DVT seen on physical exam. Negative Homan's sign. No cords or calf tenderness. Labs: Lab Results  Component Value Date   WBC 7.2 03/07/2020   HGB 8.9 (L) 03/07/2020   HCT 27.2 (L) 03/07/2020   MCV 89.5 03/07/2020   PLT 138 (L) 03/07/2020   CMP Latest Ref Rng & Units 03/05/2020  Glucose 70 - 99 mg/dL -  BUN 6 - 20 mg/dL -  Creatinine 0.44 - 1.00 mg/dL 0.60  Sodium 135 - 145 mmol/L -  Potassium 3.5 - 5.1 mmol/L -  Chloride 98 - 111 mmol/L -  CO2 22 - 32 mmol/L -  Calcium 8.9 - 10.3 mg/dL -  Total Protein 6.5 - 8.1 g/dL -  Total Bilirubin 0.3 - 1.2 mg/dL -  Alkaline Phos 38 - 126 U/L -  AST 15 - 41 U/L -  ALT 0 - 44 U/L -   Edinburgh Score: No flowsheet data found.   After visit meds:  Allergies as of 03/07/2020   No Known Allergies     Medication List    STOP taking these medications   Blood Pressure Kit Devi   Gojji Weight Scale Misc  TAKE these medications   enalapril 5 MG tablet Commonly known as: VASOTEC Take 2 tablets daily.   ferrous sulfate 325 (65 FE) MG tablet Commonly known as: FerrouSul Take 1 tablet (325 mg total) by mouth every other day. What changed: when to take this   gabapentin 300 MG capsule Commonly known as: NEURONTIN Take 1 capsule (300 mg total) by mouth 2 (two) times daily.   oxyCODONE 5 MG immediate release tablet Commonly known as: Oxy IR/ROXICODONE Take 1 tablet (5 mg total) by mouth every 4 (four) hours as needed for moderate pain.   PrePLUS 27-1 MG Tabs Take 1 tablet by mouth daily.   valACYclovir 500 MG tablet Commonly known as: Valtrex Take 1 tablet (500 mg total) by mouth 2 (two) times daily.        Discharge home in stable condition Infant Feeding: Breast Infant Disposition:home with  mother Discharge instruction: per After Visit Summary and Postpartum booklet. Activity: Advance as tolerated. Pelvic rest for 6 weeks.  Diet: routine diet Future Appointments: Future Appointments  Date Time Provider [place]  03/11/2020  2:30 PM [facility] NURSE [facility]  04/01/2020  4:93 AM Arrie Senate, MD [facility]   Follow up Visit:  Follow-up Information    [facility] Follow up on 03/11/2020.   Why: for incision check Contact information: [place]               Please schedule this patient for a In person postpartum visit in 6 weeks with the following provider: MD. Additional Postpartum F/U:Incision check 1 week and BP check 1 week  High risk pregnancy complicated by: HTN, PEC  Delivery mode:  C-Section, Low Transverse  Anticipated Birth Control:  BTL done [facility]   03/07/2020 Janet Berlin, MD

## 2020-03-05 LAB — TYPE AND SCREEN
ABO/RH(D): A POS
Antibody Screen: NEGATIVE
Unit division: 0
Unit division: 0

## 2020-03-05 LAB — CBC
HCT: 27.5 % — ABNORMAL LOW (ref 36.0–46.0)
Hemoglobin: 9.3 g/dL — ABNORMAL LOW (ref 12.0–15.0)
MCH: 29 pg (ref 26.0–34.0)
MCHC: 33.8 g/dL (ref 30.0–36.0)
MCV: 85.7 fL (ref 80.0–100.0)
Platelets: 124 10*3/uL — ABNORMAL LOW (ref 150–400)
RBC: 3.21 MIL/uL — ABNORMAL LOW (ref 3.87–5.11)
RDW: 15.4 % (ref 11.5–15.5)
WBC: 6.7 10*3/uL (ref 4.0–10.5)
nRBC: 0 % (ref 0.0–0.2)

## 2020-03-05 LAB — CREATININE, SERUM
Creatinine, Ser: 0.6 mg/dL (ref 0.44–1.00)
GFR calc Af Amer: >60 mL/min (ref >60)
GFR calc non Af Amer: >60 mL/min (ref >60)

## 2020-03-05 LAB — BPAM RBC
Blood Product Expiration Date: 202110112359
Blood Product Expiration Date: 202110112359
ISSUE DATE / TIME: 202109151712
ISSUE DATE / TIME: 202109151712
Unit Type and Rh: 6200
Unit Type and Rh: 6200

## 2020-03-05 LAB — SURGICAL PATHOLOGY

## 2020-03-05 MED ORDER — GABAPENTIN 300 MG PO CAPS
300.0000 mg | ORAL_CAPSULE | Freq: Two times a day (BID) | ORAL | Status: DC
  Administered 2020-03-05 – 2020-03-07 (×4): 300 mg via ORAL
  Filled 2020-03-05 (×4): qty 1

## 2020-03-05 NOTE — Social Work (Addendum)
CSW received consult for hx of Depression.  CSW met with MOB to offer support and complete assessment.    CSW met with MOB and introduced self and role. Maternal Aunt [facility]) of Mother was present in the room, observed holding baby Zayn. CSW asked MOB if she would like Aunt to leave the room for privacy reasons, MOB declined. CSW asked MOB about mental health history. MOB stated she has a history of depression, which she was diagnosed with and hospitalized for in 2010. MOB stated she was on medication last year but could not recall the name. MOB also attended therapy at [place] last year. CSW asked MOB if she found it helpful, she stated no. MOB stated she can return for therapy if needed. MOB declined having any additional mental health diagnosis. Maternal Aunt identified herself as a support for MOB, considering MOB lost her Mother in 2012. MOB declined disclosing any information on FOB. MOB stated she is unsure if FOB will be involved in his care at this time, but she is working to make that determination and hopeful he will also be a support.  CSW inquired on MOB other son [name]. Aunt stated Sydnee Cabal has lived with her her for two years. Aunt stated there is no legal involvement and has never been any CPS history with Elijah. MOB voluntarily allowed Elijah to live with Aunt. Aunt disclosed she is seeking guardianship in order to have the appropriate rights for Elijah. Aunt expressed she tries to have Elijah visit MOB as much as possible.   CSW asked MOB where baby Georgann Housekeeper will be discharging to, MOB stated home with her. MOB and Aunt expressed they have all of the essential needs for baby to discharge home, including a brand new careseat. MOB stated baby will go to [facility] for follow-up care. CSW asked if there are any transportation barriers to follow-up. MOB stated transportation can be expensive due to not driving. MOB expressed she has used Medicaid  transportation in the past, but she was not happy with the services. MOB was encouraged to try using them again for baby's follow-up care. MOB currently receives food stamps and plans to make a [facility] appointment. CSW asked MOB if she would like additional referrals made to resources such as Healthy Start. MOB stated she was previously enrolled in Healthy Start with [place], but could no longer be apart of the program due to lack of participation. MOB expressed she is open to another referral. CSW will also make a referral for [place].   CSW provided education regarding the baby blues period vs. perinatal mood disorders, discussed treatment and gave resources for mental health follow up if concerns arise.  CSW recommends self-evaluation during the postpartum time period using the New Mom Checklist from Postpartum Progress and encouraged MOB to contact a medical professional if symptoms are noted at any time.   CSW provided review of Sudden Infant Death Syndrome (SIDS) precautions. MOB stated baby will sleep in a crib once discharged home.   CSW identifies no further need for intervention and no barriers to discharge at this time.  Darra Lis, LCSWA [facility] [employer]

## 2020-03-05 NOTE — Progress Notes (Signed)
POSTPARTUM PROGRESS NOTE  POD #1  Subjective:  Carrie Cardenas is a 29 y.o. G2P2002 s/p pLTCS at [redacted]w[redacted]d.  She reports she doing well. No acute events overnight. She denies any problems with ambulating, voiding or po intake. Denies nausea or vomiting. She has passed flatus. Pain is well controlled.  Lochia is mild.  Objective: Blood pressure 118/84, pulse (!) 56, temperature 98.6 F (37 C), temperature source Oral, resp. rate 18, height 5\' 5"  (1.651 m), weight 131.9 kg, last menstrual period 06/11/2019, SpO2 100 %, unknown if currently breastfeeding.  Physical Exam:  General: alert, cooperative and no distress Chest: no respiratory distress Abdomen: soft, nontender Uterine Fundus: firm, appropriately tender DVT Evaluation: No calf swelling or tenderness Extremities: no edema Skin: warm, dry; incision clean/dry/intact w/ pressure dressing with some saturation however honeycomb appears clean and dry  Recent Labs    03/04/20 1533 03/05/20 0610  HGB 8.3* 9.3*  HCT 25.0* 27.5*    Assessment/Plan: Carrie Cardenas is a 29 y.o. 26 s/p pLTCS (elective) at [redacted]w[redacted]d.  POD#1 - Doing welll; pain well controlled.   Routine postpartum care  OOB, ambulated  Lovenox for VTE prophylaxis Anemia with PPH: Hgb 9.3 this am. Asymptomatic.  S/p 2U in the OR with TXA and cytotec. No further incision bleeding.   Will start ferrous sulfate daily.   Contraception: BTL already completed  Feeding: Formula   Dispo: Plan for discharge tomorrow or following day.   LOS: 1 day   [redacted]w[redacted]d, DO Family Medicine PGY-3  03/05/2020, 8:33 AM

## 2020-03-05 NOTE — Lactation Note (Signed)
This note was copied from a baby's chart. Lactation Consultation Note  Patient Name: Carrie Cardenas IDPOE'U Date: 03/05/2020  [name] now 33 hours old.  Mom is a G2.  Mom reports she did not get to breastfeed with her first baby because she had a traumatic delivery. Mom reports this delivery has been much better.  Mom reports she would like to pump and offer breastmilk from the bottle.  Mom does not have a breastpump for home use.   LC initiated using DEBP with mom. Aunt came in while mom using DEBP.  Aunt holding baby and reports baby is cuing we should try to put him on the breast.  Assisted mom with latching infant to right breast in football hold. Mom has pendulous breasts. Infant latched easily and mom reports comfort.  Aunt reports concerns that baby is not getting enough milk and that he will not get enough nutrients due to the gastric bypass.  Aunt reports that she does not take vitamins that the surgery was done in [place].   Aunt reports they can buy vitamins OTC but that they are expensive.  Urged mom to continue to take her PNVs daily.  Aunt Asked how to know baby is getting enough.  Reviewed intake is pretty close to output.  That it is important to keep track of babies wet and poops.   Baby came off the breast on his own and mom burping him.  Left mom burping him and encouraged mom to offer the other breast.  Let RN know conversation with aunt and went back into moms room and mom feeding formula.  Did not demo manual pump at this time.  Explained to mom and aunt that mom could do whatever she wants that it is not an all or nothing thing that any breastmilk is good from whether its from the breast or from the bottle.   Urged mom to call lactation as needed.   Maternal Data    Feeding Feeding Type: Formula Nipple Type: Slow - flow  LATCH Score                   Interventions    Lactation Tools Discussed/Used     Consult Status      [name] Michaelle Copas 03/05/2020, 1:14 PM

## 2020-03-06 MED ORDER — GABAPENTIN 300 MG PO CAPS
300.0000 mg | ORAL_CAPSULE | Freq: Once | ORAL | Status: AC
  Administered 2020-03-06: 300 mg via ORAL
  Filled 2020-03-06: qty 1

## 2020-03-06 MED ORDER — OXYCODONE HCL 5 MG PO TABS
5.0000 mg | ORAL_TABLET | ORAL | 0 refills | Status: AC | PRN
Start: 2020-03-06 — End: ?

## 2020-03-06 MED ORDER — FERROUS SULFATE 325 (65 FE) MG PO TABS: 325.0000 mg | ORAL_TABLET | ORAL | 0 refills | Status: AC

## 2020-03-06 MED ORDER — ENALAPRIL MALEATE 5 MG PO TABS: 5.0000 mg | ORAL_TABLET | Freq: Every day | ORAL | 2 refills | Status: DC

## 2020-03-06 MED ORDER — ENALAPRIL MALEATE 5 MG PO TABS
5.0000 mg | ORAL_TABLET | Freq: Every day | ORAL | Status: DC
  Administered 2020-03-06: 5 mg via ORAL
  Filled 2020-03-06: qty 1

## 2020-03-06 MED ORDER — GABAPENTIN 300 MG PO CAPS: 300.0000 mg | ORAL_CAPSULE | Freq: Two times a day (BID) | ORAL | 0 refills | Status: AC

## 2020-03-06 MED FILL — ENALAPRIL MALEATE 5 MG TABS: 5 | 30 days supply | Qty: 30 | Fill #0

## 2020-03-06 MED FILL — FERROUS SULFATE 325 MG TAB: 325 (65 FE) | 60 days supply | Qty: 30 | Fill #0

## 2020-03-06 MED FILL — oxyCODONE HCL 5 MG TABS: 5 | 3 days supply | Qty: 20 | Fill #0

## 2020-03-06 MED FILL — GABAPENTIN 300 MG CAPSULE: 300 | 7 days supply | Qty: 15 | Fill #0

## 2020-03-06 NOTE — Lactation Note (Signed)
This note was copied from a baby's chart. Lactation Consultation Note  Patient Name: Carrie Cardenas NMMHW'K Date: 03/06/2020    Infant is 30 hours old and 38 weeks. LC confirmed with RN, Bertram Gala, infant is bottle feeding and does not want to be seen by lactation at this time.

## 2020-03-06 NOTE — Progress Notes (Signed)
First call LD resident notified of abnormal pt assessment findings. Discussed honeycomb dressing saturated with new bleeding. Not actively bleeding at this time. In addition pt reported L leg feeling "heavy" with ambulation and noted her L upper thigh was "larger" than her R. With palpation of upper thigh fluid movement noted. Lower extremities are warm to touch. Pt denies pain in extremities. Negative Homans sign and pedal pulses are present in both feet. Pt states she has increased her activity today but remains primarily sedentary.Plana for MD to come and examine pt

## 2020-03-06 NOTE — Lactation Note (Signed)
This note was copied from a baby's chart. Lactation Consultation Note   LC checked with RN, Mom declined Lactation services at this time.

## 2020-03-06 NOTE — Progress Notes (Signed)
IV in L antecub. Removed intact-site clear-

## 2020-03-07 ENCOUNTER — Encounter (HOSPITAL_COMMUNITY): Payer: Medicaid Other

## 2020-03-07 ENCOUNTER — Inpatient Hospital Stay (HOSPITAL_COMMUNITY): Payer: Medicaid Other

## 2020-03-07 DIAGNOSIS — M7989 Other specified soft tissue disorders: Secondary | ICD-10-CM

## 2020-03-07 LAB — CBC
HCT: 27.2 % — ABNORMAL LOW (ref 36.0–46.0)
Hemoglobin: 8.9 g/dL — ABNORMAL LOW (ref 12.0–15.0)
MCH: 29.3 pg (ref 26.0–34.0)
MCHC: 32.7 g/dL (ref 30.0–36.0)
MCV: 89.5 fL (ref 80.0–100.0)
Platelets: 138 10*3/uL — ABNORMAL LOW (ref 150–400)
RBC: 3.04 MIL/uL — ABNORMAL LOW (ref 3.87–5.11)
RDW: 15.7 % — ABNORMAL HIGH (ref 11.5–15.5)
WBC: 7.2 10*3/uL (ref 4.0–10.5)
nRBC: 0 % (ref 0.0–0.2)

## 2020-03-07 MED ORDER — POLYETHYLENE GLYCOL 3350 17 G PO PACK
17.0000 g | PACK | Freq: Every day | ORAL | Status: DC
  Administered 2020-03-07: 17 g via ORAL
  Filled 2020-03-07: qty 1

## 2020-03-07 MED ORDER — ENALAPRIL MALEATE 5 MG PO TABS
10.0000 mg | ORAL_TABLET | Freq: Every day | ORAL | Status: DC
  Administered 2020-03-07: 10 mg via ORAL
  Filled 2020-03-07: qty 2

## 2020-03-07 MED ORDER — ENALAPRIL MALEATE 5 MG PO TABS: ORAL_TABLET | ORAL | 2 refills | Status: DC

## 2020-03-07 NOTE — Discharge Instructions (Signed)
Please make sure you follow up at a clinic in ONE WEEK for a blood pressure check and an incision check.    Cesarean Delivery, Care After This sheet gives you information about how to care for yourself after your procedure. Your health care provider may also give you more specific instructions. If you have problems or questions, contact your health care provider. What can I expect after the procedure? After the procedure, it is common to have:  A small amount of blood or clear fluid coming from the incision.  Some redness, swelling, and pain in your incision area.  Some abdominal pain and soreness.  Vaginal bleeding (lochia). Even though you did not have a vaginal delivery, you will still have vaginal bleeding and discharge.  Pelvic cramps.  Fatigue. You may have pain, swelling, and discomfort in the tissue between your vagina and your anus (perineum) if:  Your C-section was unplanned, and you were allowed to labor and push.  An incision was made in the area (episiotomy) or the tissue tore during attempted vaginal delivery. Follow these instructions at home: Incision care   Follow instructions from your health care provider about how to take care of your incision. Make sure you: ? Wash your hands with soap and water before you change your bandage (dressing). If soap and water are not available, use hand sanitizer. ? If you have a dressing, change it or remove it as told by your health care provider. ? Leave stitches (sutures), skin staples, skin glue, or adhesive strips in place. These skin closures may need to stay in place for 2 weeks or longer. If adhesive strip edges start to loosen and curl up, you may trim the loose edges. Do not remove adhesive strips completely unless your health care provider tells you to do that.  Check your incision area every day for signs of infection. Check for: ? More redness, swelling, or pain. ? More fluid or blood. ? Warmth. ? Pus or a bad  smell.  Do not take baths, swim, or use a hot tub until your health care provider says it's okay. Ask your health care provider if you can take showers.  When you cough or sneeze, hug a pillow. This helps with pain and decreases the chance of your incision opening up (dehiscing). Do this until your incision heals. Medicines  Take over-the-counter and prescription medicines only as told by your health care provider.  If you were prescribed an antibiotic medicine, take it as told by your health care provider. Do not stop taking the antibiotic even if you start to feel better.  Do not drive or use heavy machinery while taking prescription pain medicine. Lifestyle  Do not drink alcohol. This is especially important if you are breastfeeding or taking pain medicine.  Do not use any products that contain nicotine or tobacco, such as cigarettes, e-cigarettes, and chewing tobacco. If you need help quitting, ask your health care provider. Eating and drinking  Drink at least 8 eight-ounce glasses of water every day unless told not to by your health care provider. If you breastfeed, you may need to drink even more water.  Eat high-fiber foods every day. These foods may help prevent or relieve constipation. High-fiber foods include: ? Whole grain cereals and breads. ? Brown rice. ? Beans. ? Fresh fruits and vegetables. Activity   If possible, have someone help you care for your baby and help with household activities for at least a few days after you leave the  hospital.  Return to your normal activities as told by your health care provider. Ask your health care provider what activities are safe for you.  Rest as much as possible. Try to rest or take a nap while your baby is sleeping.  Do not lift anything that is heavier than 10 lbs (4.5 kg), or the limit that you were told, until your health care provider says that it is safe.  Talk with your health care provider about when you can engage in  sexual activity. This may depend on your: ? Risk of infection. ? How fast you heal. ? Comfort and desire to engage in sexual activity. General instructions  Do not use tampons or douches until your health care provider approves.  Wear loose, comfortable clothing and a supportive and well-fitting bra.  Keep your perineum clean and dry. Wipe from front to back when you use the toilet.  If you pass a blood clot, save it and call your health care provider to discuss. Do not flush blood clots down the toilet before you get instructions from your health care provider.  Keep all follow-up visits for you and your baby as told by your health care provider. This is important. Contact a health care provider if:  You have: ? A fever. ? Bad-smelling vaginal discharge. ? Pus or a bad smell coming from your incision. ? Difficulty or pain when urinating. ? A sudden increase or decrease in the frequency of your bowel movements. ? More redness, swelling, or pain around your incision. ? More fluid or blood coming from your incision. ? A rash. ? Nausea. ? Little or no interest in activities you used to enjoy. ? Questions about caring for yourself or your baby.  Your incision feels warm to the touch.  Your breasts turn red or become painful or hard.  You feel unusually sad or worried.  You vomit.  You pass a blood clot from your vagina.  You urinate more than usual.  You are dizzy or light-headed. Get help right away if:  You have: ? Pain that does not go away or get better with medicine. ? Chest pain. ? Difficulty breathing. ? Blurred vision or spots in your vision. ? Thoughts about hurting yourself or your baby. ? New pain in your abdomen or in one of your legs. ? A severe headache.  You faint.  You bleed from your vagina so much that you fill more than one sanitary pad in one hour. Bleeding should not be heavier than your heaviest period. Summary  After the procedure, it is  common to have pain at your incision site, abdominal cramping, and slight bleeding from your vagina.  Check your incision area every day for signs of infection.  Tell your health care provider about any unusual symptoms.  Keep all follow-up visits for you and your baby as told by your health care provider. This information is not intended to replace advice given to you by your health care provider. Make sure you discuss any questions you have with your health care provider. Document Revised: 12/13/2017 Document Reviewed: 12/13/2017 Elsevier Patient Education  2020 [employer].

## 2020-03-07 NOTE — Progress Notes (Signed)
Left lower extremity venous duplex has been completed. Preliminary results can be found in CV Proc through chart review.   03/07/20 10:55 AM Olen Cordial RVT

## 2020-03-07 NOTE — Progress Notes (Signed)
Interim Progress Note  Called by RN who stated patient's honeycomb dressing was saturated and also noticed increased swelling in left upper thigh. Went to room to assess patient. Dr. Barb Merino also present for assessment. Honeycomb dressing was notably saturated. Dressing removed. Staples still in place, wound well-approximated and no dehiscence noted. There were two small areas at central incision site with slight bleeding. Pressure held at the site of bleeding by RN for several minutes. Honeycomb dressing changed and pressure dressing applied overtop. Plan for repeat CBC in AM and will additionally re-assess in AM.   Left thigh not noticeably increased in size though difficult to assess given patient body habitus. Increased fluid wave in left thigh on exam compared to right. No pain on palpation to b/l thighs. Slight discomfort when squeezing left calf. Patient is POD #3 from LTCS and BTL. On VTE prophylaxis with Lovenox 65mg  qd. Patient ambulating to bathroom but not much more than that. Encouraged ambulation. Vitals stable. Will continue to monitor.

## 2020-03-07 NOTE — Lactation Note (Signed)
This note was copied from a baby's chart. *9+Lactation Consultation Note  Patient Name: Carrie Cardenas JMEQA'S Date: 03/07/2020   Infant is 38 weeks 71 hours old with a 4% weight loss. Mom has decided to bottle feed with Daron Offer. Infant taking between 10, 23, 25 and 40 ml.   LC reviewed with Mom the formula feeding guidelines to encourage her to increase volumes to 30-60 ml based on hours after birth.  Mom has a f/u appointment with Peds on Monday.  LC gave Mom a brochure on outpatient services if she decided to pump and bottle feed her EBM.  Plan 1. Mom to feed based on cues 8-12 in 24 hour period.           2. Monitor feedings, volumes, peas and poops on I's/O's sheet provided.           3. Reviewed with Mom formula feeding guidelines based on hours after birth.           4. Signs, symptoms and prevention of engorgement reviewed with Mom.

## 2020-03-07 NOTE — Progress Notes (Addendum)
Subjective: Postpartum Day 3: Elective Cesarean Delivery and BTL Patient reports incisional pain, tolerating PO. Patient has passed minimal gas, denies nausea or vomiting. Per patient and her RN, has not been ambulating much at all.    Objective: Vital signs in last 24 hours: Temp:  [97.7 F (36.5 C)-99.4 F (37.4 C)] 97.7 F (36.5 C) (09/18 0457) Pulse Rate:  [65-89] 82 (09/18 0457) Resp:  [17-19] 19 (09/18 0457) BP: (122-143)/(81-95) 143/90 (09/18 0457)  Physical Exam:  General: alert, cooperative and no distress Lochia: appropriate Abdomen: soft, non distended. Bowel sounds are present. Appropriately tender to palpation, not out of proportion.  Uterine Fundus: firm Incision: Pressure dressing removed, honeycomb with some areas of saturation, removed. Staples removed and some scant bloody drainage noted. Silver nitrate applied with good effect. New honeycomb placed.  Extremities: no significant asymmetry appreciated, neg Homans signs, DPP2+      Recent Labs    03/04/20 1533 03/05/20 0610  HGB 8.3* 9.3*  HCT 25.0* 27.5*    Assessment/Plan: Status post Cesarean section. POD#3. Postoperative course complicated by PPH. Has received 2U of pRBCs. Hgb stable.  Routine post-op care -encourage ambulation, continue bowel regimen -support breastfeeding  Incisional site bleeding:  -has intermittently been oozing from incision, s/p staples placed on POD#0  -staples removed today, replace honeycomb and monitor  -stop lovenox  Pre-eclampsia without SF  - 143/90 this morning. - On Vasotec 5mg . Plan to increase to 10mg  today   Acute blood loss anemia: s/p 2 unit pRBC -Hgb stable at 9.3  Lower extremity swelling - per patient, left thigh feels large and heavy in comparison to right thigh  -dopplers ordered for today   Dispo: likely POD#4, tomorrow  03/07/2020, 7:31 AM   I saw and evaluated the patient. I have edited the note above and otherwise I agree  with the findings and the plan of care as documented in the resident's note. I removed the dressing and staples and applied silver nitrate, new honeycomb. I also have ordered the LE Doppler in accordance to patient concerns. Blood pressures continue to be MR, vasotec increased to 10 mg.   Sabino Dick, MD [facility] Family Medicine Fellow, [facility] for [facility], [facility]

## 2020-03-07 NOTE — Progress Notes (Signed)
Discharge instructions reviewed with patient; pt verbalized understanding. Per pt a member from the transition of care pharmacy brought her home meds to her inpatient room on yesterday 03/06/20 and she sent the meds home.

## 2020-03-08 ENCOUNTER — Inpatient Hospital Stay (HOSPITAL_COMMUNITY): Admit: 2020-03-08 | Payer: Medicaid Other

## 2020-03-09 ENCOUNTER — Encounter: Payer: Self-pay | Admitting: Lactation Services

## 2020-03-10 ENCOUNTER — Encounter: Payer: Medicaid Other | Admitting: Family Medicine

## 2020-03-10 ENCOUNTER — Encounter (HOSPITAL_COMMUNITY): Admission: RE | Payer: Self-pay | Source: Home / Self Care

## 2020-03-10 ENCOUNTER — Inpatient Hospital Stay (HOSPITAL_COMMUNITY): Admission: RE | Admit: 2020-03-10 | Payer: Medicaid Other | Source: Home / Self Care | Admitting: Family Medicine

## 2020-03-10 SURGERY — Surgical Case
Anesthesia: Regional

## 2020-03-11 ENCOUNTER — Ambulatory Visit (INDEPENDENT_AMBULATORY_CARE_PROVIDER_SITE_OTHER): Payer: Medicaid Other

## 2020-03-11 ENCOUNTER — Other Ambulatory Visit: Payer: Self-pay

## 2020-03-11 VITALS — BP 139/90 | HR 83 | Wt 277.8 lb

## 2020-03-11 DIAGNOSIS — Z98891 History of uterine scar from previous surgery: Secondary | ICD-10-CM

## 2020-03-11 DIAGNOSIS — Z5189 Encounter for other specified aftercare: Secondary | ICD-10-CM

## 2020-03-11 DIAGNOSIS — Z0130 Encounter for examination of blood pressure without abnormal findings: Secondary | ICD-10-CM

## 2020-03-11 MED ORDER — CEPHALEXIN 500 MG PO CAPS: 500.0000 mg | ORAL_CAPSULE | Freq: Two times a day (BID) | ORAL | 0 refills | Status: DC

## 2020-03-11 NOTE — Progress Notes (Signed)
Pt here today for BP and incision check s/p primary c-section on 03/04/20; c-section at 38w 1d due to pre-eclampsia diagnosis. Incision is open to air. Multiple open areas of pink, healing tissue with serosanguinous drainage. Foul odor present. Pt states she has not cleaned the area since dc home. Alysia Penna, MD to bedside to assess. Ervin recommends washing with soap and water twice daily, followed by hydrogen peroxide to the area. Keflex BID for 7 days ordered for prophylactic treatment. Pt given dressing supplies to last until follow up check in 1 week. Reviewed s/s of infection.  Pt has no history of hypertension; history of pre-e with both pregnancies. Denies any s/s of htn today. Mild pitting edema to BLL; pt denies any increase in swelling since dc home. Pt's BP today is 139/90, HR 83. States she took enalapril 10 mg yesterday mid morning; this was the only time this medication has been taken since dc. Reviewed with Alysia Penna who recommends pt continue medication daily as prescribed and repeat BP check in 1 week. Strict return precautions given for symptoms of htn; pt does not have BP cuff at home.  Fleet Contras RN 03/11/20

## 2020-03-12 NOTE — Progress Notes (Signed)
Agree with A & P. 

## 2020-03-13 ENCOUNTER — Telehealth: Payer: Self-pay | Admitting: *Deleted

## 2020-03-13 NOTE — Telephone Encounter (Signed)
Miachel Roux , nurse with [facility] . She did a home visit this am and brought her a bp cuff and took her bp twice . It was 139/96 and 135/88. Patient told Dewayne Hatch she had already had a bp check in the office and is taking her Enalapril daily, last today at 0600.  Nurse states hoping we can follow up with her re: her bp 's.   Discussed with Dr. Shawnie Pons. No new orders . Keep next bp check as scheduled 03/18/20.    I called [facility] to discuss and heard a message voicemail not set up.  Will send mychart message.  Daviana Haymaker,RN

## 2020-03-17 ENCOUNTER — Encounter: Payer: Medicaid Other | Admitting: Nurse Practitioner

## 2020-03-17 ENCOUNTER — Other Ambulatory Visit: Payer: Self-pay

## 2020-03-17 ENCOUNTER — Inpatient Hospital Stay (HOSPITAL_COMMUNITY)
Admission: AD | Admit: 2020-03-17 | Discharge: 2020-03-18 | Disposition: A | Discharge status: Home / Self Care | Payer: Medicaid Other | Attending: Family Medicine | Admitting: Family Medicine

## 2020-03-17 DIAGNOSIS — O165 Unspecified maternal hypertension, complicating the puerperium: Secondary | ICD-10-CM

## 2020-03-17 DIAGNOSIS — Z20822 Contact with and (suspected) exposure to covid-19: Secondary | ICD-10-CM | POA: Diagnosis not present

## 2020-03-18 ENCOUNTER — Ambulatory Visit: Payer: Medicaid Other

## 2020-03-18 ENCOUNTER — Other Ambulatory Visit: Payer: Self-pay

## 2020-03-18 ENCOUNTER — Encounter (HOSPITAL_COMMUNITY): Payer: Self-pay | Admitting: Emergency Medicine

## 2020-03-18 DIAGNOSIS — O165 Unspecified maternal hypertension, complicating the puerperium: Secondary | ICD-10-CM | POA: Diagnosis present

## 2020-03-18 LAB — CBC WITH DIFFERENTIAL/PLATELET
Abs Immature Granulocytes: 0.02 10*3/uL (ref 0.00–0.07)
Basophils Absolute: 0 10*3/uL (ref 0.0–0.1)
Basophils Relative: 1 %
Eosinophils Absolute: 0.1 10*3/uL (ref 0.0–0.5)
Eosinophils Relative: 1 %
HCT: 35.4 % — ABNORMAL LOW (ref 36.0–46.0)
Hemoglobin: 11.2 g/dL — ABNORMAL LOW (ref 12.0–15.0)
Immature Granulocytes: 1 %
Lymphocytes Relative: 28 %
Lymphs Abs: 1.2 10*3/uL (ref 0.7–4.0)
MCH: 28.9 pg (ref 26.0–34.0)
MCHC: 31.6 g/dL (ref 30.0–36.0)
MCV: 91.5 fL (ref 80.0–100.0)
Monocytes Absolute: 0.3 10*3/uL (ref 0.1–1.0)
Monocytes Relative: 6 %
Neutro Abs: 2.8 10*3/uL (ref 1.7–7.7)
Neutrophils Relative %: 63 %
Platelets: 279 10*3/uL (ref 150–400)
RBC: 3.87 MIL/uL (ref 3.87–5.11)
RDW: 14.6 % (ref 11.5–15.5)
WBC: 4.4 10*3/uL (ref 4.0–10.5)
nRBC: 0 % (ref 0.0–0.2)

## 2020-03-18 LAB — COMPREHENSIVE METABOLIC PANEL
ALT: 13 U/L (ref 0–44)
AST: 18 U/L (ref 15–41)
Albumin: 2.8 g/dL — ABNORMAL LOW (ref 3.5–5.0)
Alkaline Phosphatase: 75 U/L (ref 38–126)
Anion gap: 10 (ref 5–15)
BUN: 12 mg/dL (ref 6–20)
CO2: 25 mmol/L (ref 22–32)
Calcium: 8.7 mg/dL — ABNORMAL LOW (ref 8.9–10.3)
Chloride: 107 mmol/L (ref 98–111)
Creatinine, Ser: 0.61 mg/dL (ref 0.44–1.00)
GFR calc Af Amer: >60 mL/min (ref >60)
GFR calc non Af Amer: >60 mL/min (ref >60)
Glucose, Bld: 88 mg/dL (ref 70–99)
Potassium: 3.4 mmol/L — ABNORMAL LOW (ref 3.5–5.1)
Sodium: 142 mmol/L (ref 135–145)
Total Bilirubin: 0.4 mg/dL (ref 0.3–1.2)
Total Protein: 6 g/dL — ABNORMAL LOW (ref 6.5–8.1)

## 2020-03-18 LAB — PROTEIN / CREATININE RATIO, URINE
Creatinine, Urine: 306.03 mg/dL
Protein Creatinine Ratio: 0.09 mg/mg{Cre} (ref 0.00–0.15)
Total Protein, Urine: 28 mg/dL

## 2020-03-18 LAB — RESPIRATORY PANEL BY RT PCR (FLU A&B, COVID)
Influenza A by PCR: NEGATIVE
Influenza B by PCR: NEGATIVE
SARS Coronavirus 2 by RT PCR: NEGATIVE

## 2020-03-18 MED ORDER — HYDRALAZINE HCL 20 MG/ML IJ SOLN
5.0000 mg | INTRAMUSCULAR | Status: DC | PRN
  Administered 2020-03-18: 5 mg via INTRAVENOUS
  Filled 2020-03-18: qty 1

## 2020-03-18 MED ORDER — LABETALOL HCL 100 MG PO TABS
200.0000 mg | ORAL_TABLET | Freq: Once | ORAL | Status: AC
  Administered 2020-03-18: 200 mg via ORAL
  Filled 2020-03-18: qty 2

## 2020-03-18 MED ORDER — ENALAPRIL MALEATE 20 MG PO TABS: ORAL_TABLET | ORAL | 3 refills | Status: DC

## 2020-03-18 MED ORDER — HYDRALAZINE HCL 20 MG/ML IJ SOLN
10.0000 mg | INTRAMUSCULAR | Status: DC | PRN
  Administered 2020-03-18: 10 mg via INTRAVENOUS
  Filled 2020-03-18: qty 1

## 2020-03-18 NOTE — ED Triage Notes (Signed)
Emergency Medicine Provider OB Triage Evaluation Note  Carrie Cardenas is a 29 y.o. female, G2P2002, who is 2 weeks postpartum presents to the emergency department with complaints of headache. She has a right-sided headache, onset tonight. She has been compliant with her home enalapril, which she last took tonight. She reports that her blood pressure at home was 162/102 and given new onset headache, she was prompted to come to the ER for further evaluation.  Review of  Systems  Positive: Headache Negative: Abdominal pain, numbness, weakness, visual changes, fever  Physical Exam  BP (!) 178/109 (BP Location: Right Arm)    Pulse 83    Temp 98.1 F (36.7 C) (Oral)    Resp 18    Ht 5\' 4"  (1.626 m)    Wt 135 kg    LMP 06/11/2019    SpO2 99%    BMI 51.09 kg/m  General: Awake, no distress  HEENT: Atraumatic  Resp: Normal effort  Cardiac: Normal rate Abd: Nondistended, nontender  MSK: Moves all extremities without difficulty Neuro: Speech clear  Medical Decision Making  Pt evaluated for pregnancy concern and is stable for transfer to MAU. Pt is in agreement with plan for transfer.  12:48 AM Discussed with MAU APP, 06/13/2019, who accepts patient in transfer.  Clinical Impression  No diagnosis found.  Blood pressure is 178/109 in the ER. Patient is medically stable and appropriate for transfer at this time. There is concern for postpartum preeclampsia. She will be transferred to the MAU for further work-up and evaluation.   Raelyn Mora A, PA-C 03/18/20 [phone]

## 2020-03-18 NOTE — MAU Provider Note (Signed)
History     CSN: [id number]  Arrival date and time: 03/17/20 2355   First Provider Initiated Contact with Patient 03/18/20 0208      Chief Complaint  Patient presents with  . Hypertension    2 weeks post partum   Ms. Carrie Cardenas is a 29 y.o. year old 732P2002 female at 568w1d weeks gestation who was sent to MAU by the [facility] reporting she had a C-section on March 04, 2020.  She had no blood pressure problems during her pregnancy until the last week after her delivery.  She was prescribed Vasotec 10 mg to take daily; she has been taking every day.  She reports a headache all day and feels like she "got a cold over the weekend by staying at her cousin's house where the air conditioner was too cold".  She complains of mucus in her chest and a lot of cough since this weekend.  She has taken Tylenol 1000 mg with no relief and also the oxycodone that was prescribed to her this morning with no relief.  She denies blurred vision, epigastric pain or increased swelling.  She reports her blood pressure at home was 167/102.   OB History    Gravida  2   Para  2   Term  2   Preterm  0   AB  0   Living  2     SAB  0   TAB  0   Ectopic  0   Multiple  0   Live Births  2           Past Medical History:  Diagnosis Date  . Depression    pt hospitalized at 29 years old for major depression  . Herpes genitalis in women   . Hypovolemic shock (HCC)   . Mallory-Weiss tear 04/07/2016  . Pregnancy induced hypertension   . Retained products of conception, following delivery with hemorrhage 04/05/2016  . Sepsis (HCC)   . Severe preeclampsia, third trimester 03/30/2016    Past Surgical History:  Procedure Laterality Date  . CESAREAN SECTION WITH BILATERAL TUBAL LIGATION N/A 03/04/2020   Procedure: CESAREAN SECTION WITH BILATERAL TUBAL LIGATION;  Surgeon: Hermina StaggersErvin, Michael L, MD;  Location: [facility]S;  Service: Obstetrics;  Laterality: N/A;  . DILATION AND EVACUATION N/A 04/05/2016    Procedure: DILATATION AND EVACUATION;  Surgeon: Tereso NewcomerUgonna A Anyanwu, MD;  Location: [facility];  Service: Gynecology;  Laterality: N/A;  . ESOPHAGOGASTRODUODENOSCOPY N/A 04/07/2016   Procedure: ESOPHAGOGASTRODUODENOSCOPY (EGD);  Surgeon: Charlott RakesVincent Schooler, MD;  [facility];  Service: Endoscopy;  Laterality: N/A;  . ESOPHAGOGASTRODUODENOSCOPY N/A 04/07/2016   Procedure: ESOPHAGOGASTRODUODENOSCOPY (EGD);  Surgeon: Charlott RakesVincent Schooler, MD;  [facility];  Service: Endoscopy;  Laterality: N/A;  . ESOPHAGOGASTRODUODENOSCOPY N/A 04/08/2016   Procedure: ESOPHAGOGASTRODUODENOSCOPY (EGD);  Surgeon: Ovidio Kinavid Newman, MD;  [facility];  Service: General;  Laterality: N/A;  . GASTRIC BYPASS    . SLEEVE GASTROPLASTY  2015    Family History  Problem Relation Age of Onset  . Fibroids Mother   . Anemia Mother   . Pulmonary embolism Mother   . Cancer Maternal Grandmother     Social History   Tobacco Use  . Smoking status: Never Smoker  . Smokeless tobacco: Never Used  Vaping Use  . Vaping Use: Never used  Substance Use Topics  . Alcohol use: No  . Drug use: No    Allergies: No Known Allergies  Medications Prior to Admission  Medication Sig Dispense Refill  Last Dose  . ferrous sulfate (FERROUSUL) 325 (65 FE) MG tablet Take 1 tablet (325 mg total) by mouth every other day. 30 tablet 0 03/17/2020 at 0800  . oxyCODONE (OXY IR/ROXICODONE) 5 MG immediate release tablet Take 1 tablet (5 mg total) by mouth every 4 (four) hours as needed for moderate pain. 20 tablet 0 03/17/2020 at 2000  . Prenatal Vit-Fe Fumarate-FA (PREPLUS) 27-1 MG TABS Take 1 tablet by mouth daily.    Past Week at Unknown time  . cephALEXin (KEFLEX) 500 MG capsule Take 1 capsule (500 mg total) by mouth 2 (two) times daily. 14 capsule 0 FINISHED  . gabapentin (NEURONTIN) 300 MG capsule Take 1 capsule (300 mg total) by mouth 2 (two) times daily. (Patient not taking: Reported on 03/11/2020) 15 capsule 0   . valACYclovir  (VALTREX) 500 MG tablet Take 1 tablet (500 mg total) by mouth 2 (two) times daily. (Patient not taking: Reported on 03/11/2020) 60 tablet 5 More than a month at Unknown time  . [DISCONTINUED] enalapril (VASOTEC) 5 MG tablet Take 2 tablets daily. 30 tablet 2  at 1400    Review of Systems  Constitutional: Negative.   HENT: Positive for congestion.   Eyes: Negative.   Respiratory: Positive for cough (mucous in chest).   Cardiovascular: Negative.   Gastrointestinal: Negative.   Endocrine: Negative.   Genitourinary: Negative.   Musculoskeletal: Negative.   Skin: Negative.   Allergic/Immunologic: Negative.   Neurological: Positive for headaches.  Hematological: Negative.   Psychiatric/Behavioral: Negative.    Physical Exam   Patient Vitals for the past 24 hrs:  BP Temp Temp src Pulse Resp SpO2 Height Weight  03/18/20 0531 (!) 156/86 -- -- 87 -- -- -- --  03/18/20 0522 (!) 163/95 -- -- -- -- -- -- --  03/18/20 0516 (!) 163/95 -- -- 77 -- -- -- --  03/18/20 0501 (!) 160/92 -- -- 67 -- -- -- --  03/18/20 0446 (!) 156/87 -- -- 66 -- -- -- --  03/18/20 0431 (!) 157/77 -- -- 66 -- -- -- --  03/18/20 0416 (!) 152/98 -- -- 79 -- -- -- --  03/18/20 0401 (!) 155/98 -- -- 68 -- -- -- --  03/18/20 0355 (!) 165/97 -- -- -- -- -- -- --  03/18/20 0346 (!) 165/97 -- -- 67 -- -- -- --  03/18/20 0341 (!) 158/102 -- -- 76 -- -- -- --  03/18/20 0246 139/88 -- -- 79 -- -- -- --  03/18/20 0231 (!) 146/92 -- -- 74 -- -- -- --  03/18/20 0216 (!) 149/106 -- -- 90 -- -- -- --  03/18/20 0210 (!) 144/96 -- -- 87 -- -- -- --  03/18/20 0201 (!) 144/96 -- -- 87 -- -- -- --  03/18/20 0146 (!) 135/114 -- -- 79 -- -- -- --  03/18/20 0139 (!) 150/105 -- -- 77 -- -- -- --  03/18/20 0106 138/87 98.2 F (36.8 C) Oral 95 20 -- 5' 4.5" (1.638 m) 124 kg  03/18/20 0002 -- -- -- -- -- -- 5\' 4"  (1.626 m) 135 kg  03/18/20 0001 (!) 178/109 98.1 F (36.7 C) Oral 83 18 99 % -- --     Physical Exam Vitals and nursing note  reviewed.  Constitutional:      Appearance: Normal appearance. She is obese.  HENT:     Head: Normocephalic and atraumatic.     Nose: Nose normal.  Eyes:     Pupils: Pupils are equal,  round, and reactive to light.  Cardiovascular:     Rate and Rhythm: Normal rate and regular rhythm.     Pulses: Normal pulses.     Heart sounds: Normal heart sounds.  Pulmonary:     Effort: Pulmonary effort is normal.     Breath sounds: Normal breath sounds.  Musculoskeletal:        General: Normal range of motion.     Cervical back: Normal range of motion.  Skin:    General: Skin is warm and dry.  Neurological:     Mental Status: She is alert and oriented to person, place, and time.  Psychiatric:        Mood and Affect: Mood normal.        Behavior: Behavior normal.        Thought Content: Thought content normal.        Judgment: Judgment normal.    MAU Course  Procedures  MDM CBC CMP P/C Ratio Serial BP's  Labetalol 200 mg PO (unable to gain IV access and phlebotomy unable to draw labs) Hydralazine 5 mg IVP  Results for orders placed or performed during the hospital encounter of 03/17/20 (from the past 24 hour(s))  Protein / creatinine ratio, urine     Status: None   Collection Time: 03/18/20  1:26 AM  Result Value Ref Range   Creatinine, Urine 306.03 mg/dL   Total Protein, Urine 28 mg/dL   Protein Creatinine Ratio 0.09 0.00 - 0.15 mg/mg[Cre]  CBC with Differential     Status: Abnormal   Collection Time: 03/18/20  3:30 AM  Result Value Ref Range   WBC 4.4 4.0 - 10.5 K/uL   RBC 3.87 3.87 - 5.11 MIL/uL   Hemoglobin 11.2 (L) 12.0 - 15.0 g/dL   HCT 66.2 (L) 36 - 46 %   MCV 91.5 80.0 - 100.0 fL   MCH 28.9 26.0 - 34.0 pg   MCHC 31.6 30.0 - 36.0 g/dL   RDW 94.7 65.4 - 65.0 %   Platelets 279 150 - 400 K/uL   nRBC 0.0 0.0 - 0.2 %   Neutrophils Relative % 63 %   Neutro Abs 2.8 1.7 - 7.7 K/uL   Lymphocytes Relative 28 %   Lymphs Abs 1.2 0.7 - 4.0 K/uL   Monocytes Relative 6 %    Monocytes Absolute 0.3 0 - 1 K/uL   Eosinophils Relative 1 %   Eosinophils Absolute 0.1 0 - 0 K/uL   Basophils Relative 1 %   Basophils Absolute 0.0 0 - 0 K/uL   Immature Granulocytes 1 %   Abs Immature Granulocytes 0.02 0.00 - 0.07 K/uL  Comprehensive metabolic panel     Status: Abnormal   Collection Time: 03/18/20  3:30 AM  Result Value Ref Range   Sodium 142 135 - 145 mmol/L   Potassium 3.4 (L) 3.5 - 5.1 mmol/L   Chloride 107 98 - 111 mmol/L   CO2 25 22 - 32 mmol/L   Glucose, Bld 88 70 - 99 mg/dL   BUN 12 6 - 20 mg/dL   Creatinine, Ser 3.54 0.44 - 1.00 mg/dL   Calcium 8.7 (L) 8.9 - 10.3 mg/dL   Total Protein 6.0 (L) 6.5 - 8.1 g/dL   Albumin 2.8 (L) 3.5 - 5.0 g/dL   AST 18 15 - 41 U/L   ALT 13 0 - 44 U/L   Alkaline Phosphatase 75 38 - 126 U/L   Total Bilirubin 0.4 0.3 - 1.2 mg/dL   GFR  calc non Af Amer >60 >60 mL/min   GFR calc Af Amer >60 >60 mL/min   Anion gap 10 5 - 15  Respiratory Panel by RT PCR (Flu A&B, Covid) - Nasopharyngeal Swab     Status: None   Collection Time: 03/18/20  3:40 AM   Specimen: Nasopharyngeal Swab  Result Value Ref Range   SARS Coronavirus 2 by RT PCR NEGATIVE NEGATIVE   Influenza A by PCR NEGATIVE NEGATIVE   Influenza B by PCR NEGATIVE NEGATIVE     *Consult with Dr. Adrian Blackwater @ 0530 - notified of patient's complaints, assessments, and lab results,  Recommended tx plan increase medication to Enalapril 20 mg daily and BP check on Friday 03/20/2020 - ok to d/c home, agrees with plan   Assessment and Plan  Postpartum hypertension - Plan: Discharge patient - Rx for Enalapril 20 mg daily  - Please return for headache that is not relieved with pain medications you have at home. You need to pick up your new prescription of blood pressure medication (Enalapril 20 mg) and take it daily. You will also need to be seen at the office for a blood pressure check on Friday 03/20/2020. Someone should call you to get this scheduled.  Raelyn Mora, MSN,  CNM 03/18/2020, 2:08 AM

## 2020-03-18 NOTE — MAU Note (Signed)
PT SAYS SHE DEL BY C/S ON 03-04-20.Marland Kitchen  NO BP PROBLEMS WHILE PREG UNTIL LAST WEEK. AFTER DEL - BP WAS UP.  SENT HOME  WITH MEDS - HAS BEEN TAKING . HAS H/A- STARTED THIS AM - TOOK XS 2 TABS OF TYLENOL- NO RELIEF.  DENIES BLURRED VISION , EPIGASTRIC PAIN.   TOOK OXYCODONE - THIS AM.

## 2020-03-18 NOTE — Discharge Instructions (Signed)
Please return for headache that is not relieved with pain medications you have at home. You need to pick up your new prescription of blood pressure medication (Enalapril 20 mg) and take it daily. You will also need to be seen at the office for a blood pressure check on Friday 03/20/2020. Someone should call you to get this scheduled.

## 2020-03-18 NOTE — ED Triage Notes (Signed)
Patient reports elevated blood pressure at home this evening BP= 160/102 , mild headache , denies fever /respirations unlabored.

## 2020-03-20 ENCOUNTER — Ambulatory Visit: Payer: Medicaid Other

## 2020-03-23 ENCOUNTER — Ambulatory Visit (INDEPENDENT_AMBULATORY_CARE_PROVIDER_SITE_OTHER): Payer: Medicaid Other | Admitting: Obstetrics and Gynecology

## 2020-03-23 ENCOUNTER — Ambulatory Visit (INDEPENDENT_AMBULATORY_CARE_PROVIDER_SITE_OTHER): Payer: Medicaid Other | Admitting: Clinical

## 2020-03-23 ENCOUNTER — Other Ambulatory Visit: Payer: Self-pay | Admitting: Family Medicine

## 2020-03-23 ENCOUNTER — Other Ambulatory Visit: Payer: Self-pay

## 2020-03-23 VITALS — BP 147/110 | HR 88 | Ht 64.5 in | Wt 263.9 lb

## 2020-03-23 DIAGNOSIS — O98312 Other infections with a predominantly sexual mode of transmission complicating pregnancy, second trimester: Secondary | ICD-10-CM

## 2020-03-23 DIAGNOSIS — O99345 Other mental disorders complicating the puerperium: Secondary | ICD-10-CM | POA: Diagnosis not present

## 2020-03-23 DIAGNOSIS — Z98891 History of uterine scar from previous surgery: Secondary | ICD-10-CM

## 2020-03-23 DIAGNOSIS — Z658 Other specified problems related to psychosocial circumstances: Secondary | ICD-10-CM

## 2020-03-23 DIAGNOSIS — Z5189 Encounter for other specified aftercare: Secondary | ICD-10-CM

## 2020-03-23 DIAGNOSIS — O165 Unspecified maternal hypertension, complicating the puerperium: Secondary | ICD-10-CM

## 2020-03-23 MED ORDER — ENALAPRIL MALEATE 10 MG PO TABS
ORAL_TABLET | ORAL | 1 refills | Status: AC
Start: 2020-03-23 — End: ?

## 2020-03-23 MED ORDER — CEPHALEXIN 500 MG PO CAPS: 500.0000 mg | ORAL_CAPSULE | Freq: Three times a day (TID) | ORAL | 0 refills | Status: AC

## 2020-03-23 NOTE — Patient Instructions (Addendum)
Center for [facility] at [facility] [address] [place], [place] [address] [phone] (main office) [phone] [facility] office)  Transportation Resources [facility] [employer] ([place]) [address] [place] [place], [place], [place] [address] https://www.[place].gov/departments/transportation/gdot-divisions/[place]-transit-agency-public-transportation-division     . Fixed-route bus services, including regional fare cards for PART, [place], [place], and WSTA buses.  . Reduced fare bus ID's available for Medicaid, Medicare, and "orange card" recipients.  Marland Kitchen SCAT offers curb-to-curb and door-to-door bus services for people with disabilities who are unable to use a fixed-bus route; also offers a shared-ride program.   Helpful tips:  -Routes available online and physical maps available at the main bus hub lobby (each for a specific route) -Smartphone directions often include bus routes (see the "bus" icon, next to the "car" and "walk" icons) -Routes differ on weekends, evenings and holidays, so plan ahead!  -If you have Medicaid, Medicare, or orange card, plan to obtain a reduced-fare ID to save 50% on rides. Check days and times to obtain an ID, and bring all necessary documents.   [employer] [place]) [address] [place], [place]. [address] [place], [place] [address] [phone] SpotApps.nl **Fixed-bus route services, and demand response bus service for older adults  Department of Social [facility] [address], [place], [place] [address] [phone] www.MysterySinger.com.cy **Medicaid transportation is available to [facility] recipients who need assistance getting to [facility] medical appointments and providers  [facility] [address] [place] [address], [place], [place]  [address] www.cjmedicaltransportation.com  ** Offers non-emergency transportation for medical appointments  [employer] [address], [place], [place] [address] [phone] www.wheels4hope.org **REFERRAL NEEDED by specific agencies (see website), after meeting specified criteria only  Housing Resources                    [employer] (serves [place], [place], [place], [place], [place], [place], [place], [place], [place], [place], [place], [place], and [place] counties) [address] [place], [place] [address] [phone] DeveloperU.ch  **Rental assistance, Home Rehabilitation,Weatherization Assistance Program, Chief Financial Officer, Housing Voucher Program   Housing Resources [facility]  Housing Authority- [place] [address], [place], [place] [address] [phone] www.gha-[place].[facility] [address] [place], [place] [address] [phone] PhoneCaptions.ch **Programs include: Hospital doctor and Housing Counseling, Healthy Radiographer, therapeutic, Homeless Prevention and Housing Assistance  Government [facility] [address], [place], [place] [address] [phone] www.PaintingEmporium.co.za **housing applications/recertification; tax payment relief/exemption under specific qualifications  [facility] [address], [place], [place] [address] www.onlineg[place]boro.com/~maryshouse **transitional housing for women in recovery who have minor children or are pregnant  [facility] [address] [place], [place], [place] [address] https://johnson-smith.net/  **emergency shelter and support services for families facing homelessness  [employer] [address], [place], [place] [address] [phone] www.youthfocus.org **transitional housing to pregnant women; emergency housing for youth who have run away, are experiencing a family  crisis, are victims of abuse or neglect, or are homeless  [facility] [address] [place], [place] [address] [phone] ircgso.org **Drop-in center for people experiencing homelessness; overnight warming center when temperature is 25 degrees or below  [employer] [address] [place], [place], [place] [address] [phone] https://reentrystaffingagency.org/ **help with affordable housing to people experiencing homelessness or unemployment due to incarceration  [facility] [address] [place], [place] [address] [phone] www.greensborourbanministry.org  **emergency and transitional housing, rent/mortgage  assistance, utility assistance  [facility] [address], [place], [place] [address] [phone] www.salvationarmyofgreensboro.org **emergency and transitional housing  [employer] for [employer] [address], [place], [place] [address] [phone] Www.habitatgreensboro.[facility]   [employer] [address], [place], [place] [address] [phone] https://chshousing.org **Home Ownership/Affordable Housing Program and [facility]  [employer] [address], [place], [place] [address] [phone] PaidValue.com.cy **home buyer education courses, foreclosure prevention  [facility] [address] [place], [place] [address] [phone] DefMagazine.is **Environmental Exposure Assessment (investigation of homes where either children or pregnant women with a confirmed elevated blood lead level reside)  [facility] [address] [place], [place] [address] [phone] ShowReturn.ca **Home Expense Assistance/Repairs Program; offers home accessibility updates, such as  ramps or bars in the bathroom  [facility] [address], [place], [place] [address] [phone] https://www.self-help.org/locations/[place]-branch **Offers credit-building and banking services to people unable to use traditional banking   [place] Child Counsellor  (Childcare options, Early childcare development, etc.) www.guilfordchilddev.org

## 2020-03-23 NOTE — Patient Instructions (Signed)
Take Enalapril 10 mg daily Pick up new RX for Keflex and continue another week until all pills gone

## 2020-03-23 NOTE — Addendum Note (Signed)
Addended by: Leroy Libman on: 03/23/2020 11:46 AM   Modules accepted: Orders, Level of Service

## 2020-03-23 NOTE — Progress Notes (Signed)
Here for bp and incision check. States got off schedule with Keflex because was sick- is still taking, has 4 or so left. Will take until all taken.  States has not picked up enalapril 20 mg tablets yet, is only taking the 5 mg tablets and only taking one per day. States wasn't sure if she should double or what. Denies headaches or edema.   bp elevated.  Incision with several partially open, pink areas . Incision pink , wet - covered by pad that has dried brownish dicharge. Dr. Earlene Plater in to examine wound. Advised patient to keep wound clean and dry, change pad as needed to keep dry. Will send in new rx for Keflex to continue another week. Also advised take Enalapril 10 mg daily, come back 1 week for bp check and incision check.  Explained to patient may take 5 mg tablets 2 tablets per day to equal 10mg  until all gone ; then start 10mg  tablets one tablet per day. Instructed her not to pick up Enalapril 20 mg previously ordered. She voices understanding.  Patient asked to see [facility] because she needs to talk to someone. April 22 is able to see her today and patient taken to her room.  Treyvon Blahut,RN Aamina Skiff,RN

## 2020-03-23 NOTE — Progress Notes (Signed)
    Post Cesarean Section Incision Check  Carrie Cardenas is a 29 y.o. African-American [id number] (Patient's last menstrual period was 06/11/2019.) who is s/p 1LTCS on 03/04/20 at 38 weeks for pre-eclampsia. She was discharged to home on POD#3. Pregnancy complicated by pre-eclampsia.  Complains of smell and tenderness at incision. Placed on keflex last week for same, has not been taking it due to a cold. Reports she has also been not taking BP meds as prescribed. Denies other complaints, overall she is feeling well.   Physical Exam:  BP (!) 147/110   Pulse 88   Ht 5' 4.5" (1.638 m)   Wt 263 lb 14.4 oz (119.7 kg)   LMP 06/11/2019   BMI 44.60 kg/m  Body mass index is 44.6 kg/m. General appearance: Well nourished, well developed female in no acute distress.  Abdomen: soft, appropriately tender, incision moist but clean, small amount of separation at left aspect, mild cellulitis at left aspect, no signs of active bleeding   Assessment: Patient is a 29 y.o. [id number] who is 2 weeks post partum from a c-section. She is doing okay. Incision appears to be healing but appears infected.    Plan:  Keflex sent to pharmacy Instructed patient on proper dose for anti-hypertensive Return 1 week   K. Therese Sarah, M.D. Attending Center for [employer] [facility])

## 2020-03-23 NOTE — BH Specialist Note (Addendum)
Integrated Behavioral Health Follow Up In-person Visit  MRN: [id number] Name: Carrie Cardenas  Number of Integrated Behavioral Health Clinician visits: 2/6 Session Start time: 11:05  Session End time: 11:40 Total time: 35   Type of Service: Integrated Behavioral Health- Individual/Family Interpretor:No. Interpretor Name and Language: n/a  SUBJECTIVE: Carrie Cardenas is a 29 y.o. female accompanied by newborn son Patient was referred by Carrie Antigua, MD for psychosocial stress. Patient reports the following symptoms/concerns: Pt states her primary concern today is stress over whether or not to tell FOB about herpes outbreak, along with financial stress.  Duration of problem: Postpartum; Severity of problem: moderate  OBJECTIVE: Mood: Anxious and Affect: Appropriate Risk of harm to self or others: No plan to harm self or others  LIFE CONTEXT: Family and Social: Pt lives with newborn son School/Work: Pt out of work since May Self-Care: - Life Changes: Recent childbirth, financial stress  GOALS ADDRESSED: Patient will: 1.  Reduce symptoms of: anxiety and stress  2.  Increase knowledge and/or ability of: healthy habits and stress reduction  3.  Demonstrate ability to: Increase healthy adjustment to current life circumstances, Increase adequate support systems for patient/family and Increase motivation to adhere to plan of care  INTERVENTIONS: Interventions utilized:  Motivational Interviewing, Psychoeducation and/or Health Education and Link to [employer] Standardized Assessments completed: PHQ9/GAD7 given in past two weeks  ASSESSMENT: Patient currently experiencing Psychosocial stress.   Patient may benefit from continued psychoeducation and brief therapeutic interventions regarding coping with symptoms of anxiety and life stress .  PLAN: 1. Follow up with behavioral health clinician on : As needed 2. Behavioral recommendations:  -Consider pros and cons about  having a discussion with  [facility] about positive  STD  -Contact Medicaid worker (or [email or web address]) if you'd like to switch to a Medicaid plan accepted by [facility] -Consider registering for and attending a new mom online support group at either [email or web address] or [email or web address] for additional support  -Accept referrals for financial assistance via [id number] -Consider additional community support (on After Visit Summary) as needed -Continue taking iron pills as prescribed postpartum 3. Referral(s): Integrated Art gallery manager (In Clinic) and [employer]:  Finances, Housing and Transportation  [address] [place], [place]  Depression screen [facility] 2/9 03/11/2020 02/25/2020 01/21/2020 12/24/2019 11/19/2019  Decreased Interest 0 0 1 1 0  Down, Depressed, Hopeless 0 2 1 1  0  PHQ - 2 Score 0 2 2 2  0  Altered sleeping 0 2 2 - 0  Tired, decreased energy 0 0 2 3 0  Change in appetite 0 0 2 1 0  Feeling bad or failure about yourself  0 0 1 0 0  Trouble concentrating 0 0 0 0 0  Moving slowly or fidgety/restless 0 0 0 0 0  Suicidal thoughts 0 0 0 0 0  PHQ-9 Score 0 4 9 - 0  Difficult doing work/chores - - - - Not difficult at all  Some recent data might be hidden   GAD 7 : Generalized Anxiety Score 03/11/2020 02/25/2020 01/21/2020 12/24/2019  Nervous, Anxious, on Edge 0 0 0 0  Control/stop worrying 0 0 1 1  Worry too much - different things 0 0 1 1  Trouble relaxing 0 0 0 0  Restless 0 0 0 0  Easily annoyed or irritable 0 0 1 0  Afraid - awful might happen 0 0 0 0  Total GAD 7 Score 0 0 3 2

## 2020-03-30 ENCOUNTER — Ambulatory Visit: Payer: Medicaid Other

## 2020-04-01 ENCOUNTER — Ambulatory Visit: Payer: Self-pay | Admitting: Family Medicine

## 2020-05-25 ENCOUNTER — Encounter: Payer: Self-pay | Admitting: General Practice

## 2020-11-18 ENCOUNTER — Encounter: Payer: Self-pay | Admitting: *Deleted
